# Patient Record
Sex: Female | Born: 1945
Health system: Southern US, Community
[De-identification: ages and names within clinical notes are randomized; demographics above are authoritative.]

## PROBLEM LIST (undated history)

## (undated) DIAGNOSIS — I729 Aneurysm of unspecified site: Secondary | ICD-10-CM

## (undated) DIAGNOSIS — I2510 Atherosclerotic heart disease of native coronary artery without angina pectoris: Secondary | ICD-10-CM

## (undated) DIAGNOSIS — M810 Age-related osteoporosis without current pathological fracture: Secondary | ICD-10-CM

## (undated) DIAGNOSIS — I2102 ST elevation (STEMI) myocardial infarction involving left anterior descending coronary artery: Secondary | ICD-10-CM

## (undated) DIAGNOSIS — K219 Gastro-esophageal reflux disease without esophagitis: Secondary | ICD-10-CM

## (undated) DIAGNOSIS — I10 Essential (primary) hypertension: Secondary | ICD-10-CM

## (undated) DIAGNOSIS — E119 Type 2 diabetes mellitus without complications: Secondary | ICD-10-CM

## (undated) DIAGNOSIS — I255 Ischemic cardiomyopathy: Secondary | ICD-10-CM

## (undated) DIAGNOSIS — E785 Hyperlipidemia, unspecified: Secondary | ICD-10-CM

## (undated) DIAGNOSIS — M1990 Unspecified osteoarthritis, unspecified site: Secondary | ICD-10-CM

## (undated) DIAGNOSIS — K5190 Ulcerative colitis, unspecified, without complications: Secondary | ICD-10-CM

## (undated) DIAGNOSIS — M5030 Other cervical disc degeneration, unspecified cervical region: Secondary | ICD-10-CM

## (undated) HISTORY — DX: Gastro-esophageal reflux disease without esophagitis: K21.9

## (undated) HISTORY — PX: CARDIAC CATHETERIZATION: SHX172

## (undated) HISTORY — PX: BLADDER SUSPENSION: SHX72

## (undated) HISTORY — DX: Hyperlipidemia, unspecified: E78.5

## (undated) HISTORY — PX: ABDOMINAL HYSTERECTOMY: SHX81

## (undated) HISTORY — DX: Age-related osteoporosis without current pathological fracture: M81.0

## (undated) HISTORY — PX: PTCA: SHX146

## (undated) HISTORY — PX: NECK SURGERY: SHX720

---

## 1997-09-09 ENCOUNTER — Ambulatory Visit (HOSPITAL_BASED_OUTPATIENT_CLINIC_OR_DEPARTMENT_OTHER): Admission: RE | Admit: 1997-09-09 | Discharge: 1997-09-09 | Payer: Self-pay | Admitting: Orthopedic Surgery

## 1999-07-16 ENCOUNTER — Encounter: Admission: RE | Admit: 1999-07-16 | Discharge: 1999-07-16 | Payer: Self-pay | Admitting: Neurosurgery

## 1999-07-16 ENCOUNTER — Encounter: Payer: Self-pay | Admitting: Neurosurgery

## 1999-07-25 ENCOUNTER — Ambulatory Visit (HOSPITAL_COMMUNITY): Admission: RE | Admit: 1999-07-25 | Discharge: 1999-07-25 | Payer: Self-pay | Admitting: Neurosurgery

## 1999-07-25 ENCOUNTER — Encounter: Payer: Self-pay | Admitting: Neurosurgery

## 1999-08-02 ENCOUNTER — Encounter: Payer: Self-pay | Admitting: Neurosurgery

## 1999-08-02 ENCOUNTER — Ambulatory Visit (HOSPITAL_COMMUNITY): Admission: RE | Admit: 1999-08-02 | Discharge: 1999-08-02 | Payer: Self-pay | Admitting: Neurosurgery

## 1999-08-20 ENCOUNTER — Encounter: Payer: Self-pay | Admitting: Internal Medicine

## 1999-08-20 ENCOUNTER — Encounter: Admission: RE | Admit: 1999-08-20 | Discharge: 1999-08-20 | Payer: Self-pay | Admitting: *Deleted

## 2000-01-02 ENCOUNTER — Ambulatory Visit (HOSPITAL_COMMUNITY): Admission: RE | Admit: 2000-01-02 | Discharge: 2000-01-02 | Payer: Self-pay | Admitting: Neurosurgery

## 2000-01-02 ENCOUNTER — Encounter: Payer: Self-pay | Admitting: Neurosurgery

## 2000-04-22 ENCOUNTER — Ambulatory Visit (HOSPITAL_COMMUNITY): Admission: RE | Admit: 2000-04-22 | Discharge: 2000-04-22 | Payer: Self-pay | Admitting: Neurosurgery

## 2000-04-22 ENCOUNTER — Encounter: Payer: Self-pay | Admitting: Neurosurgery

## 2000-08-21 ENCOUNTER — Encounter: Payer: Self-pay | Admitting: Unknown Physician Specialty

## 2000-08-21 ENCOUNTER — Encounter: Admission: RE | Admit: 2000-08-21 | Discharge: 2000-08-21 | Payer: Self-pay | Admitting: Unknown Physician Specialty

## 2001-03-23 ENCOUNTER — Encounter: Payer: Self-pay | Admitting: Neurosurgery

## 2001-03-23 ENCOUNTER — Ambulatory Visit (HOSPITAL_COMMUNITY): Admission: RE | Admit: 2001-03-23 | Discharge: 2001-03-23 | Payer: Self-pay | Admitting: Neurosurgery

## 2001-10-16 ENCOUNTER — Encounter: Payer: Self-pay | Admitting: Unknown Physician Specialty

## 2001-10-16 ENCOUNTER — Encounter: Admission: RE | Admit: 2001-10-16 | Discharge: 2001-10-16 | Payer: Self-pay | Admitting: Unknown Physician Specialty

## 2001-11-02 ENCOUNTER — Encounter: Payer: Self-pay | Admitting: Neurology

## 2001-11-02 ENCOUNTER — Ambulatory Visit (HOSPITAL_COMMUNITY): Admission: RE | Admit: 2001-11-02 | Discharge: 2001-11-02 | Payer: Self-pay | Admitting: Neurology

## 2002-05-08 ENCOUNTER — Encounter: Payer: Self-pay | Admitting: Neurology

## 2002-05-08 ENCOUNTER — Ambulatory Visit (HOSPITAL_COMMUNITY): Admission: RE | Admit: 2002-05-08 | Discharge: 2002-05-08 | Payer: Self-pay | Admitting: Neurology

## 2002-06-13 ENCOUNTER — Ambulatory Visit (HOSPITAL_COMMUNITY): Admission: RE | Admit: 2002-06-13 | Discharge: 2002-06-13 | Payer: Self-pay | Admitting: Interventional Radiology

## 2002-08-28 ENCOUNTER — Other Ambulatory Visit: Admission: RE | Admit: 2002-08-28 | Discharge: 2002-08-28 | Payer: Self-pay | Admitting: Gynecology

## 2002-10-30 ENCOUNTER — Encounter: Payer: Self-pay | Admitting: Neurology

## 2002-10-30 ENCOUNTER — Ambulatory Visit (HOSPITAL_COMMUNITY): Admission: RE | Admit: 2002-10-30 | Discharge: 2002-10-30 | Payer: Self-pay | Admitting: Neurology

## 2002-11-01 ENCOUNTER — Encounter: Payer: Self-pay | Admitting: Unknown Physician Specialty

## 2002-11-01 ENCOUNTER — Encounter: Admission: RE | Admit: 2002-11-01 | Discharge: 2002-11-01 | Payer: Self-pay | Admitting: Unknown Physician Specialty

## 2003-04-21 ENCOUNTER — Ambulatory Visit (HOSPITAL_COMMUNITY): Admission: RE | Admit: 2003-04-21 | Discharge: 2003-04-21 | Payer: Self-pay | Admitting: Neurology

## 2003-05-02 ENCOUNTER — Ambulatory Visit (HOSPITAL_COMMUNITY): Admission: RE | Admit: 2003-05-02 | Discharge: 2003-05-02 | Payer: Self-pay | Admitting: Neurology

## 2003-07-22 ENCOUNTER — Encounter (INDEPENDENT_AMBULATORY_CARE_PROVIDER_SITE_OTHER): Payer: Self-pay | Admitting: Gastroenterology

## 2004-06-08 ENCOUNTER — Ambulatory Visit (HOSPITAL_COMMUNITY): Admission: RE | Admit: 2004-06-08 | Discharge: 2004-06-08 | Payer: Self-pay | Admitting: Interventional Radiology

## 2004-06-15 ENCOUNTER — Ambulatory Visit (HOSPITAL_COMMUNITY): Admission: RE | Admit: 2004-06-15 | Discharge: 2004-06-15 | Payer: Self-pay | Admitting: *Deleted

## 2004-07-19 ENCOUNTER — Ambulatory Visit (HOSPITAL_COMMUNITY): Admission: RE | Admit: 2004-07-19 | Discharge: 2004-07-19 | Payer: Self-pay | Admitting: Orthopedic Surgery

## 2004-07-22 ENCOUNTER — Encounter: Admission: RE | Admit: 2004-07-22 | Discharge: 2004-07-22 | Payer: Self-pay | Admitting: Unknown Physician Specialty

## 2004-08-31 ENCOUNTER — Ambulatory Visit (HOSPITAL_COMMUNITY): Admission: RE | Admit: 2004-08-31 | Discharge: 2004-08-31 | Payer: Self-pay | Admitting: Unknown Physician Specialty

## 2004-09-08 ENCOUNTER — Ambulatory Visit: Payer: Self-pay | Admitting: Gastroenterology

## 2004-10-25 ENCOUNTER — Ambulatory Visit: Payer: Self-pay | Admitting: Gastroenterology

## 2005-11-22 ENCOUNTER — Ambulatory Visit (HOSPITAL_COMMUNITY): Admission: RE | Admit: 2005-11-22 | Discharge: 2005-11-22 | Payer: Self-pay | Admitting: Neurosurgery

## 2005-12-05 ENCOUNTER — Ambulatory Visit (HOSPITAL_COMMUNITY): Admission: RE | Admit: 2005-12-05 | Discharge: 2005-12-05 | Payer: Self-pay | Admitting: Interventional Radiology

## 2006-04-27 ENCOUNTER — Ambulatory Visit (HOSPITAL_COMMUNITY): Admission: RE | Admit: 2006-04-27 | Discharge: 2006-04-27 | Payer: Self-pay | Admitting: Unknown Physician Specialty

## 2006-05-02 ENCOUNTER — Encounter: Admission: RE | Admit: 2006-05-02 | Discharge: 2006-05-02 | Payer: Self-pay

## 2008-07-21 ENCOUNTER — Ambulatory Visit: Payer: Self-pay | Admitting: Gastroenterology

## 2008-07-21 DIAGNOSIS — K219 Gastro-esophageal reflux disease without esophagitis: Secondary | ICD-10-CM | POA: Insufficient documentation

## 2008-07-21 DIAGNOSIS — R109 Unspecified abdominal pain: Secondary | ICD-10-CM

## 2008-07-21 DIAGNOSIS — R197 Diarrhea, unspecified: Secondary | ICD-10-CM

## 2008-07-21 DIAGNOSIS — Z860100 Personal history of colon polyps, unspecified: Secondary | ICD-10-CM | POA: Insufficient documentation

## 2008-07-21 DIAGNOSIS — K5730 Diverticulosis of large intestine without perforation or abscess without bleeding: Secondary | ICD-10-CM | POA: Insufficient documentation

## 2008-07-21 DIAGNOSIS — R143 Flatulence: Secondary | ICD-10-CM

## 2008-07-21 DIAGNOSIS — R141 Gas pain: Secondary | ICD-10-CM | POA: Insufficient documentation

## 2008-07-21 DIAGNOSIS — R1084 Generalized abdominal pain: Secondary | ICD-10-CM | POA: Insufficient documentation

## 2008-07-21 DIAGNOSIS — R142 Eructation: Secondary | ICD-10-CM

## 2008-09-08 ENCOUNTER — Encounter: Payer: Self-pay | Admitting: Gastroenterology

## 2010-06-12 ENCOUNTER — Encounter: Payer: Self-pay | Admitting: Unknown Physician Specialty

## 2010-06-13 ENCOUNTER — Encounter: Payer: Self-pay | Admitting: Interventional Radiology

## 2010-06-13 ENCOUNTER — Encounter: Payer: Self-pay | Admitting: Neurosurgery

## 2010-09-07 ENCOUNTER — Other Ambulatory Visit: Payer: Self-pay | Admitting: Neurology

## 2010-09-07 DIAGNOSIS — I671 Cerebral aneurysm, nonruptured: Secondary | ICD-10-CM

## 2010-09-09 ENCOUNTER — Ambulatory Visit
Admission: RE | Admit: 2010-09-09 | Discharge: 2010-09-09 | Disposition: A | Discharge status: Home / Self Care | Payer: Medicare Other | Source: Ambulatory Visit | Attending: Neurology | Admitting: Neurology

## 2010-09-09 DIAGNOSIS — I671 Cerebral aneurysm, nonruptured: Secondary | ICD-10-CM

## 2010-09-09 MED ORDER — IOHEXOL 350 MG/ML SOLN
100.0000 mL | Freq: Once | INTRAVENOUS | Status: AC | PRN
  Administered 2010-09-09: 100 mL via INTRAVENOUS

## 2010-09-09 MED ORDER — IOHEXOL 300 MG/ML  SOLN: 50.0000 mL | Freq: Once | INTRAMUSCULAR | Status: AC | PRN

## 2010-10-08 NOTE — Consult Note (Signed)
NAMEHENDY, Casey Avila              ACCOUNT NO.:  [id number]   MEDICAL RECORD NO.:  [id number]          PATIENT TYPE:  OUT   LOCATION:  XRAY                         FACILITY:  [facility]   PHYSICIAN:  Sanjeev K. Deveshwar, M.D.DATE OF BIRTH:  Feb 13, 1946   DATE OF CONSULTATION:  12/20/2005  DATE OF DISCHARGE:                                   CONSULTATION   DATE OF CONSULT:  December 20, 2005.   CHIEF COMPLAINT:  Cerebral aneurysm.   HISTORY OF PRESENT ILLNESS:  This is a 65 year old female with a history of  headaches.  She has a known cerebral aneurysm.  She previously had a  cerebral angiogram performed on June 13, 2002 that showed a left  posterior communicating artery aneurysm.  She returned December 05, 2005 for a  repeat angiogram, at that time she was again noted to have a left posterior  communicating artery aneurysm that was measured at 5.2 mm x 3.2 mm.  The  aneurysm appeared to be a saccular aneurysm.  During her most recent  angiogram a newer technology consisting of a 3-D rotational arteriogram with  subsequent 3-D reconstruction was performed.  This revealed the aneurysm to  be irregular in the fundus area.  It is not certain whether the aneurysm was  changed or not because this newer technology was not available during her  previous angiogram.   The patient has since met with Dr. Newell Coral, her neurosurgeon to discuss  possible treatment options versus continued monitoring of the aneurysm.  She  presents today accompanied by her niece to discuss these same issues with  Dr. Corliss Skains.   PAST MEDICAL HISTORY:  Past medical history significant for hyperlipidemia  and hypertension.  She has borderline diabetes controlled through diet.  She  has had small strokes by MRI.  She has history of hiatal hernia, peptic  ulcer disease, ulcerative colitis.  She has had two herniated disks.  She  has a right shoulder rotator cuff injury which is in need of repair however,  her surgeon Dr.  Margaree Mackintosh does not want to perform the surgery until her  aneurysm has been addressed.   PAST SURGICAL HISTORY:  Surgical history significant for a cervical fusion  at C5-C6, C6-C7 and a partial hysterectomy due to an abnormal Pap smear at  age 67.  She reports previous difficulties with anesthesia consisting of  both hypertension and hypotension.   ALLERGIES:  THE PATIENT WAS PREVIOUSLY NOTED TO BE ALLERGIC TO KEFLEX AND  PENICILLIN, HOWEVER, SHE STATES THAT THIS IS NOT TRUE.  SHE IS ALLERGIC TO  AN ANTIFUNGAL MEDICATION WHICH CAUSED A RASH - SHE DOES NOT RECALL THE NAME.   CURRENT MEDICATIONS:  Include Vicodin, Percocet, Elavil, Ecotrin, calcium,  Centrum Silver, Nexium, Toprol XL, Xanax, Topamax, Effexor, diltiazem and  she takes Cipro on a p.r.n. basis for cystic type infections.   SOCIAL HISTORY:  The patient is married.  She has two children.  She lives  in [place].  She has smoked 1 to 1-1/2 pack cigarettes per day for at least 40  years and continues to do so.  She  does not use alcohol.  She currently is  not working.   FAMILY HISTORY:  Her mother died at age 78 from a cerebral aneurysm.  Her  father died at age 60 from cirrhosis.  Her grandmother died at age 69 from a  cerebral aneurysm.  She has a granddaughter who had surgery for a cerebral  aneurysm.   IMPRESSION AND PLAN:  As noted this patient presents today accompanied by  her niece to discuss possible treatment options for her cerebral aneurysm.  Dr. Corliss Skains reviewed the results of the most recent angiogram as well as  her previous angiogram with the patient and her niece.  He explained the new  three-dimensional arteriogram and the findings revealed by this most recent  study.  He went over the treatment options for the aneurysm including  continued monitoring, open surgery with clipping as well as endovascular  coiling.  Risks and benefits of each option were discussed in detail.  All  of the patient's questions were  answered.  The patient wants to go home and  think about her options in detail.  Dr. Corliss Skains agreed with this approach.  The patient will contact us once she has made a decision.  In the interim,  Dr. Corliss Skains has recommended that she try to quit smoking and be sure that  her blood pressure is under good control to further decrease the risk of  aneurysm rupture.  As noted this patient does have a very strong family  history of ruptured aneurysms.   Greater than 40 minutes was spent on this consult.      Delton See, P.A.    ______________________________  Grandville Silos. Corliss Skains, M.D.    DR/MEDQ  D:  12/20/2005  T:  12/20/2005  Job:  [id number]   cc:   Hewitt Shorts, M.D.  Genene Churn. Love, M.D.  Nena Jordan

## 2010-12-17 ENCOUNTER — Other Ambulatory Visit: Payer: Self-pay | Admitting: Neurosurgery

## 2010-12-17 DIAGNOSIS — M545 Low back pain, unspecified: Secondary | ICD-10-CM

## 2010-12-18 ENCOUNTER — Ambulatory Visit
Admission: RE | Admit: 2010-12-18 | Discharge: 2010-12-18 | Disposition: A | Discharge status: Home / Self Care | Payer: Medicare Other | Source: Ambulatory Visit | Attending: Neurosurgery | Admitting: Neurosurgery

## 2010-12-18 DIAGNOSIS — M545 Low back pain: Secondary | ICD-10-CM

## 2011-01-11 ENCOUNTER — Other Ambulatory Visit (HOSPITAL_COMMUNITY): Payer: Self-pay | Admitting: Neurosurgery

## 2011-01-11 ENCOUNTER — Ambulatory Visit (HOSPITAL_COMMUNITY)
Admission: RE | Admit: 2011-01-11 | Discharge: 2011-01-11 | Disposition: A | Discharge status: Home / Self Care | Payer: Medicare Other | Source: Ambulatory Visit | Attending: Neurosurgery | Admitting: Neurosurgery

## 2011-01-11 ENCOUNTER — Encounter (HOSPITAL_COMMUNITY)
Admission: RE | Admit: 2011-01-11 | Discharge: 2011-01-11 | Disposition: A | Discharge status: Home / Self Care | Payer: Medicare Other | Source: Ambulatory Visit | Attending: Neurosurgery | Admitting: Neurosurgery

## 2011-01-11 DIAGNOSIS — Z01812 Encounter for preprocedural laboratory examination: Secondary | ICD-10-CM | POA: Insufficient documentation

## 2011-01-11 DIAGNOSIS — J4489 Other specified chronic obstructive pulmonary disease: Secondary | ICD-10-CM | POA: Insufficient documentation

## 2011-01-11 DIAGNOSIS — Z01811 Encounter for preprocedural respiratory examination: Secondary | ICD-10-CM | POA: Insufficient documentation

## 2011-01-11 DIAGNOSIS — J449 Chronic obstructive pulmonary disease, unspecified: Secondary | ICD-10-CM | POA: Insufficient documentation

## 2011-01-11 DIAGNOSIS — M47812 Spondylosis without myelopathy or radiculopathy, cervical region: Secondary | ICD-10-CM

## 2011-01-11 LAB — BASIC METABOLIC PANEL
BUN: 8 mg/dL (ref 6–23)
CO2: 26 mEq/L (ref 19–32)
Calcium: 9.5 mg/dL (ref 8.4–10.5)
Chloride: 110 mEq/L (ref 96–112)
Creatinine, Ser: 0.8 mg/dL (ref 0.50–1.10)
GFR calc Af Amer: >60 mL/min (ref >60)
GFR calc non Af Amer: >60 mL/min (ref >60)
Glucose, Bld: 157 mg/dL — ABNORMAL HIGH (ref 70–99)
Potassium: 4.1 mEq/L (ref 3.5–5.1)
Sodium: 145 mEq/L (ref 135–145)

## 2011-01-11 LAB — SURGICAL PCR SCREEN
MRSA, PCR: NEGATIVE
Staphylococcus aureus: NEGATIVE

## 2011-01-11 LAB — CBC
HCT: 39.6 % (ref 36.0–46.0)
Hemoglobin: 14.2 g/dL (ref 12.0–15.0)
MCH: 31.8 pg (ref 26.0–34.0)
MCHC: 35.9 g/dL (ref 30.0–36.0)
MCV: 88.8 fL (ref 78.0–100.0)
Platelets: 273 10*3/uL (ref 150–400)
RBC: 4.46 MIL/uL (ref 3.87–5.11)
RDW: 12.8 % (ref 11.5–15.5)
WBC: 10.5 10*3/uL (ref 4.0–10.5)

## 2011-01-19 ENCOUNTER — Observation Stay (HOSPITAL_COMMUNITY): Payer: Medicare Other

## 2011-01-19 ENCOUNTER — Observation Stay (HOSPITAL_COMMUNITY)
Admission: RE | Admit: 2011-01-19 | Discharge: 2011-01-20 | Disposition: A | Discharge status: Home / Self Care | Payer: Medicare Other | Source: Ambulatory Visit | Attending: Neurosurgery | Admitting: Neurosurgery

## 2011-01-19 DIAGNOSIS — Z01818 Encounter for other preprocedural examination: Secondary | ICD-10-CM | POA: Insufficient documentation

## 2011-01-19 DIAGNOSIS — M5030 Other cervical disc degeneration, unspecified cervical region: Secondary | ICD-10-CM | POA: Insufficient documentation

## 2011-01-19 DIAGNOSIS — M47812 Spondylosis without myelopathy or radiculopathy, cervical region: Secondary | ICD-10-CM | POA: Insufficient documentation

## 2011-01-19 DIAGNOSIS — Z01812 Encounter for preprocedural laboratory examination: Secondary | ICD-10-CM | POA: Insufficient documentation

## 2011-01-19 DIAGNOSIS — M5020 Other cervical disc displacement, unspecified cervical region: Principal | ICD-10-CM | POA: Insufficient documentation

## 2011-01-19 DIAGNOSIS — J4489 Other specified chronic obstructive pulmonary disease: Secondary | ICD-10-CM | POA: Insufficient documentation

## 2011-01-19 DIAGNOSIS — J449 Chronic obstructive pulmonary disease, unspecified: Secondary | ICD-10-CM | POA: Insufficient documentation

## 2011-01-19 DIAGNOSIS — K219 Gastro-esophageal reflux disease without esophagitis: Secondary | ICD-10-CM | POA: Insufficient documentation

## 2011-01-19 DIAGNOSIS — I10 Essential (primary) hypertension: Secondary | ICD-10-CM | POA: Insufficient documentation

## 2011-01-19 DIAGNOSIS — G8929 Other chronic pain: Secondary | ICD-10-CM | POA: Insufficient documentation

## 2011-01-19 LAB — GLUCOSE, CAPILLARY
Glucose-Capillary: 114 mg/dL — ABNORMAL HIGH (ref 70–99)
Glucose-Capillary: 209 mg/dL — ABNORMAL HIGH (ref 70–99)

## 2011-01-20 LAB — GLUCOSE, CAPILLARY: Glucose-Capillary: 131 mg/dL — ABNORMAL HIGH (ref 70–99)

## 2011-02-04 NOTE — Op Note (Signed)
NAMEMISSOURI, LAPAGLIA              ACCOUNT NO.:  [id number]  MEDICAL RECORD NO.:  [id number]  LOCATION:  3535                         FACILITY:  [facility]  PHYSICIAN:  Hewitt Shorts, M.D.DATE OF BIRTH:  1945-11-14  DATE OF PROCEDURE:  01/19/2011 DATE OF DISCHARGE:                              OPERATIVE REPORT   PREOPERATIVE DIAGNOSIS:  C4-5 cervical disk herniation, cervical degenerative disk disease, cervical spondylosis and cervical radiculopathy.  POSTOPERATIVE DIAGNOSIS:  C4-5 cervical disk herniation, cervical degenerative disk disease, cervical spondylosis and cervical radiculopathy.  PROCEDURE:  C4-5 anterior cervical decompression arthrodesis with allograft and Tether cervical plating.  SURGEON:  Hewitt Shorts, MD  ASSISTANT:  Dr. Lovell Sheehan.  ANESTHESIA:  General endotracheal.  INDICATIONS:  The patient is a 65 year old woman status post previous C5- 6, C6-7 anterior cervical diskectomy and fusion over 10 years ago.  She presented now with neck and left cervical radicular pain.  She was found to have C4-5 disk herniation.  The decision made to proceed with diskectomy and arthrodesis.  PROCEDURE:  The patient was brought to the operating room and placed under general endotracheal anesthesia.  The patient was placed in 10 pounds of Holter traction.  The neck was prepped with Betadine soap and solution and draped in a sterile fashion.  Horizontal incision was made on the left side of the neck.  The line of incision was infiltrated with local epinephrine.  Incision made and carried down through subcutaneous tissue and platysma.  Bipolar cautery and electrocautery were used to maintain hemostasis.  Dissection was then carried out through an avascular into the sternocleidomastoid, carotid artery and jugular vein laterally and trachea and esophagus medially.  The ventral aspect of the vertebral column was identified.  The previous plate overlying the C5 vertebra  was identified and we identified the C4-5 disk space immediately rostral to the upper edge of the plate.  The annulus was incised and disk space entered.  Diskectomy was performed using variety of micro curettes and pituitary rongeurs.  The operating microscope was draped and brought into the field for additional navigation, illumination, and visualization.  The remainder of the decompression was performed using microdissection and microsurgical technique.  The cartilaginous endplates of the vertebral bodies were removed using micro curettes along with the high-speed drill.  Then, posterior osteophytic overgrowth was removed using the high-speed drill along with the 2-mm Kerrison punch with a thin footplate.  Posterior longitudinal ligament was removed along with the disk herniation.  We were able to decompress the spinal canal and the thecal sac.  We then turned our attention to the neural foramina and removed spondylotic overgrowth and disk material bilaterally decompressing the exiting L5 nerve roots and the neural foramen.  Once decompression was completed, hemostasis was established with use of Gelfoam soaked in thrombin.  We measured the height of the intervertebral disk space and selected a 7-mm allograft implant, it was hydrated in saline solution and positioned in the intervertebral disk space and countersunk.  We then attempted to position a Tether cervical plate over the fusion construct, but the upper edge of the old Align plate was too close to the superior C5 endplate, and therefore we had to  use a carbide medical cutting bur to cut the upper end of the old plate to remove the 2 screws in C5.  The screw holes were filled with Gelfoam and then we positioned a 12-mm Tether cervical plate over the fusion construct, it was secured with a pair of 13 x 4 mm screws placing a pair of variable screws at C4 and a pair of fixed screws at C5.  Once all four screws were positioned, final  tightening was done.  The wound was irrigated extensively with saline solution to try and wash away as much of the metal burs as possible and then we confirmed hemostasis, then we proceeded with closure.  Platysma was closed with interrupted and inverted 2-0 undyed Vicryl sutures.  Subcutaneous and subcuticulars were closed with interrupted and inverted 3-0 undyed Vicryl sutures.  Skin was closed with Dermabond.  An x-ray was taken which showed the graft, plate, and screws in good position.  The alignment was good and then the patient was taken out of traction to be reversed from the anesthetic, extubated, and transferred to the recovery room for further care. Estimated blood loss was 25 mL.  Sponge and needle count correct.     Hewitt Shorts, M.D.     RWN/MEDQ  D:  01/19/2011  T:  01/19/2011  Job:  [id number]  Electronically Signed by Shirlean Kelly M.D. on 02/04/2011 05:13:51 PM

## 2011-02-08 ENCOUNTER — Ambulatory Visit
Admission: RE | Admit: 2011-02-08 | Discharge: 2011-02-08 | Disposition: A | Discharge status: Home / Self Care | Payer: Medicare Other | Source: Ambulatory Visit | Attending: Neurosurgery | Admitting: Neurosurgery

## 2011-02-08 ENCOUNTER — Other Ambulatory Visit: Payer: Self-pay | Admitting: Neurosurgery

## 2011-02-08 DIAGNOSIS — M25512 Pain in left shoulder: Secondary | ICD-10-CM

## 2011-08-23 ENCOUNTER — Other Ambulatory Visit: Payer: Self-pay | Admitting: Neurology

## 2011-08-23 DIAGNOSIS — I671 Cerebral aneurysm, nonruptured: Secondary | ICD-10-CM

## 2011-08-30 ENCOUNTER — Ambulatory Visit
Admission: RE | Admit: 2011-08-30 | Discharge: 2011-08-30 | Disposition: A | Discharge status: Home / Self Care | Payer: Medicare Other | Source: Ambulatory Visit | Attending: Neurology | Admitting: Neurology

## 2011-08-30 ENCOUNTER — Other Ambulatory Visit: Payer: Self-pay | Admitting: Internal Medicine

## 2011-08-30 ENCOUNTER — Ambulatory Visit
Admission: RE | Admit: 2011-08-30 | Discharge: 2011-08-30 | Disposition: A | Discharge status: Home / Self Care | Payer: Medicare Other | Source: Ambulatory Visit | Attending: Internal Medicine | Admitting: Internal Medicine

## 2011-08-30 DIAGNOSIS — I671 Cerebral aneurysm, nonruptured: Secondary | ICD-10-CM

## 2011-08-30 DIAGNOSIS — Z1231 Encounter for screening mammogram for malignant neoplasm of breast: Secondary | ICD-10-CM

## 2011-08-30 MED ORDER — IOHEXOL 350 MG/ML SOLN
100.0000 mL | Freq: Once | INTRAVENOUS | Status: AC | PRN
  Administered 2011-08-30: 100 mL via INTRAVENOUS

## 2011-10-07 ENCOUNTER — Other Ambulatory Visit: Payer: Self-pay | Admitting: Neurosurgery

## 2011-10-07 DIAGNOSIS — M5030 Other cervical disc degeneration, unspecified cervical region: Secondary | ICD-10-CM

## 2011-10-07 DIAGNOSIS — M5412 Radiculopathy, cervical region: Secondary | ICD-10-CM

## 2011-10-07 DIAGNOSIS — M5020 Other cervical disc displacement, unspecified cervical region: Secondary | ICD-10-CM

## 2011-10-19 ENCOUNTER — Ambulatory Visit
Admission: RE | Admit: 2011-10-19 | Discharge: 2011-10-19 | Disposition: A | Discharge status: Home / Self Care | Payer: Medicare Other | Source: Ambulatory Visit | Attending: Neurosurgery | Admitting: Neurosurgery

## 2011-10-19 DIAGNOSIS — M5020 Other cervical disc displacement, unspecified cervical region: Secondary | ICD-10-CM

## 2011-10-19 DIAGNOSIS — M5030 Other cervical disc degeneration, unspecified cervical region: Secondary | ICD-10-CM

## 2011-10-19 DIAGNOSIS — M5412 Radiculopathy, cervical region: Secondary | ICD-10-CM

## 2012-02-13 ENCOUNTER — Other Ambulatory Visit: Payer: Self-pay | Admitting: Neurosurgery

## 2012-02-13 DIAGNOSIS — M542 Cervicalgia: Secondary | ICD-10-CM

## 2012-02-13 DIAGNOSIS — M5030 Other cervical disc degeneration, unspecified cervical region: Secondary | ICD-10-CM

## 2012-02-13 DIAGNOSIS — M5020 Other cervical disc displacement, unspecified cervical region: Secondary | ICD-10-CM

## 2012-03-05 ENCOUNTER — Ambulatory Visit
Admission: RE | Admit: 2012-03-05 | Discharge: 2012-03-05 | Disposition: A | Discharge status: Home / Self Care | Payer: Medicare Other | Source: Ambulatory Visit | Attending: Neurosurgery | Admitting: Neurosurgery

## 2012-03-05 DIAGNOSIS — M5020 Other cervical disc displacement, unspecified cervical region: Secondary | ICD-10-CM

## 2012-03-05 DIAGNOSIS — M542 Cervicalgia: Secondary | ICD-10-CM

## 2012-03-05 DIAGNOSIS — M5030 Other cervical disc degeneration, unspecified cervical region: Secondary | ICD-10-CM

## 2012-06-12 ENCOUNTER — Other Ambulatory Visit: Payer: Self-pay | Admitting: Neurology

## 2012-06-12 DIAGNOSIS — I671 Cerebral aneurysm, nonruptured: Secondary | ICD-10-CM

## 2012-06-15 ENCOUNTER — Other Ambulatory Visit: Payer: Medicare Other

## 2013-01-22 ENCOUNTER — Other Ambulatory Visit (HOSPITAL_COMMUNITY): Payer: Self-pay | Admitting: Neurosurgery

## 2013-01-22 DIAGNOSIS — I671 Cerebral aneurysm, nonruptured: Secondary | ICD-10-CM

## 2013-01-24 ENCOUNTER — Ambulatory Visit (HOSPITAL_COMMUNITY)
Admission: RE | Admit: 2013-01-24 | Discharge: 2013-01-24 | Disposition: A | Discharge status: Home / Self Care | Payer: Medicare Other | Source: Ambulatory Visit | Attending: Neurosurgery | Admitting: Neurosurgery

## 2013-01-24 DIAGNOSIS — I671 Cerebral aneurysm, nonruptured: Secondary | ICD-10-CM

## 2013-01-24 DIAGNOSIS — Z09 Encounter for follow-up examination after completed treatment for conditions other than malignant neoplasm: Secondary | ICD-10-CM | POA: Insufficient documentation

## 2013-01-24 LAB — BUN: BUN: 10 mg/dL (ref 6–23)

## 2013-01-24 LAB — CREATININE, SERUM
Creatinine, Ser: 0.66 mg/dL (ref 0.50–1.10)
GFR calc Af Amer: >90 mL/min (ref >90)
GFR calc non Af Amer: 89 mL/min — ABNORMAL LOW (ref >90)

## 2013-01-24 MED ORDER — IOHEXOL 350 MG/ML SOLN: 50.0000 mL | Freq: Once | INTRAVENOUS | Status: AC | PRN

## 2013-07-22 ENCOUNTER — Other Ambulatory Visit: Payer: Self-pay

## 2013-07-22 DIAGNOSIS — Z1231 Encounter for screening mammogram for malignant neoplasm of breast: Secondary | ICD-10-CM

## 2013-07-23 ENCOUNTER — Ambulatory Visit
Admission: RE | Admit: 2013-07-23 | Discharge: 2013-07-23 | Disposition: A | Discharge status: Home / Self Care | Payer: Medicare Other | Source: Ambulatory Visit

## 2013-07-23 DIAGNOSIS — Z1231 Encounter for screening mammogram for malignant neoplasm of breast: Secondary | ICD-10-CM

## 2014-02-26 ENCOUNTER — Other Ambulatory Visit (HOSPITAL_COMMUNITY): Payer: Self-pay | Admitting: Internal Medicine

## 2014-02-26 ENCOUNTER — Ambulatory Visit (HOSPITAL_COMMUNITY)
Admission: RE | Admit: 2014-02-26 | Discharge: 2014-02-26 | Disposition: A | Discharge status: Home / Self Care | Payer: Medicare Other | Source: Ambulatory Visit | Attending: Internal Medicine | Admitting: Internal Medicine

## 2014-02-26 DIAGNOSIS — R1031 Right lower quadrant pain: Secondary | ICD-10-CM | POA: Diagnosis present

## 2014-02-26 DIAGNOSIS — R319 Hematuria, unspecified: Secondary | ICD-10-CM | POA: Insufficient documentation

## 2014-02-26 DIAGNOSIS — R109 Unspecified abdominal pain: Secondary | ICD-10-CM

## 2014-02-26 DIAGNOSIS — M545 Low back pain, unspecified: Secondary | ICD-10-CM

## 2014-02-26 DIAGNOSIS — N200 Calculus of kidney: Secondary | ICD-10-CM | POA: Insufficient documentation

## 2014-04-09 ENCOUNTER — Other Ambulatory Visit: Payer: Self-pay | Admitting: Neurosurgery

## 2014-04-09 DIAGNOSIS — M542 Cervicalgia: Secondary | ICD-10-CM

## 2014-04-11 ENCOUNTER — Ambulatory Visit
Admission: RE | Admit: 2014-04-11 | Discharge: 2014-04-11 | Disposition: A | Discharge status: Home / Self Care | Payer: Medicare Other | Source: Ambulatory Visit | Attending: Neurosurgery | Admitting: Neurosurgery

## 2014-04-11 DIAGNOSIS — M542 Cervicalgia: Secondary | ICD-10-CM

## 2014-04-21 ENCOUNTER — Other Ambulatory Visit: Payer: Self-pay | Admitting: Neurosurgery

## 2014-04-21 DIAGNOSIS — I671 Cerebral aneurysm, nonruptured: Secondary | ICD-10-CM

## 2014-04-25 ENCOUNTER — Ambulatory Visit
Admission: RE | Admit: 2014-04-25 | Discharge: 2014-04-25 | Disposition: A | Discharge status: Home / Self Care | Payer: Medicare Other | Source: Ambulatory Visit | Attending: Neurosurgery | Admitting: Neurosurgery

## 2014-04-25 DIAGNOSIS — I671 Cerebral aneurysm, nonruptured: Secondary | ICD-10-CM

## 2014-04-25 MED ORDER — IOHEXOL 350 MG/ML SOLN
80.0000 mL | Freq: Once | INTRAVENOUS | Status: AC | PRN
Start: 2014-04-25 — End: 2014-04-25
  Administered 2014-04-25: 80 mL via INTRAVENOUS

## 2014-05-02 ENCOUNTER — Inpatient Hospital Stay (HOSPITAL_COMMUNITY): Payer: Medicare Other

## 2014-05-02 ENCOUNTER — Encounter (HOSPITAL_COMMUNITY): Payer: Self-pay | Admitting: Nurse Practitioner

## 2014-05-02 ENCOUNTER — Inpatient Hospital Stay (HOSPITAL_COMMUNITY)
Admission: EM | Admit: 2014-05-02 | Discharge: 2014-05-05 | DRG: 282 | Disposition: A | Discharge status: Home / Self Care | Payer: Medicare Other | Attending: Interventional Cardiology | Admitting: Interventional Cardiology

## 2014-05-02 ENCOUNTER — Encounter (HOSPITAL_COMMUNITY)
Admission: EM | Disposition: A | Discharge status: Home / Self Care | Payer: Medicare Other | Source: Home / Self Care | Attending: Interventional Cardiology

## 2014-05-02 ENCOUNTER — Emergency Department (HOSPITAL_COMMUNITY): Payer: Medicare Other

## 2014-05-02 DIAGNOSIS — I10 Essential (primary) hypertension: Secondary | ICD-10-CM | POA: Diagnosis present

## 2014-05-02 DIAGNOSIS — F17200 Nicotine dependence, unspecified, uncomplicated: Secondary | ICD-10-CM | POA: Diagnosis present

## 2014-05-02 DIAGNOSIS — Z885 Allergy status to narcotic agent status: Secondary | ICD-10-CM

## 2014-05-02 DIAGNOSIS — F17210 Nicotine dependence, cigarettes, uncomplicated: Secondary | ICD-10-CM | POA: Diagnosis present

## 2014-05-02 DIAGNOSIS — E785 Hyperlipidemia, unspecified: Secondary | ICD-10-CM | POA: Diagnosis present

## 2014-05-02 DIAGNOSIS — I2510 Atherosclerotic heart disease of native coronary artery without angina pectoris: Secondary | ICD-10-CM | POA: Diagnosis present

## 2014-05-02 DIAGNOSIS — I5041 Acute combined systolic (congestive) and diastolic (congestive) heart failure: Secondary | ICD-10-CM | POA: Diagnosis present

## 2014-05-02 DIAGNOSIS — I671 Cerebral aneurysm, nonruptured: Secondary | ICD-10-CM | POA: Diagnosis present

## 2014-05-02 DIAGNOSIS — Z955 Presence of coronary angioplasty implant and graft: Secondary | ICD-10-CM | POA: Diagnosis not present

## 2014-05-02 DIAGNOSIS — J449 Chronic obstructive pulmonary disease, unspecified: Secondary | ICD-10-CM | POA: Diagnosis present

## 2014-05-02 DIAGNOSIS — I213 ST elevation (STEMI) myocardial infarction of unspecified site: Secondary | ICD-10-CM

## 2014-05-02 DIAGNOSIS — I2102 ST elevation (STEMI) myocardial infarction involving left anterior descending coronary artery: Principal | ICD-10-CM

## 2014-05-02 DIAGNOSIS — R079 Chest pain, unspecified: Secondary | ICD-10-CM

## 2014-05-02 DIAGNOSIS — I255 Ischemic cardiomyopathy: Secondary | ICD-10-CM

## 2014-05-02 DIAGNOSIS — I517 Cardiomegaly: Secondary | ICD-10-CM

## 2014-05-02 DIAGNOSIS — R000 Tachycardia, unspecified: Secondary | ICD-10-CM | POA: Diagnosis present

## 2014-05-02 DIAGNOSIS — E1159 Type 2 diabetes mellitus with other circulatory complications: Secondary | ICD-10-CM

## 2014-05-02 DIAGNOSIS — I252 Old myocardial infarction: Secondary | ICD-10-CM | POA: Diagnosis not present

## 2014-05-02 HISTORY — DX: ST elevation (STEMI) myocardial infarction involving left anterior descending coronary artery: I21.02

## 2014-05-02 HISTORY — PX: LEFT HEART CATHETERIZATION WITH CORONARY ANGIOGRAM: SHX5451

## 2014-05-02 HISTORY — DX: Ischemic cardiomyopathy: I25.5

## 2014-05-02 HISTORY — DX: Aneurysm of unspecified site: I72.9

## 2014-05-02 HISTORY — DX: Essential (primary) hypertension: I10

## 2014-05-02 LAB — CBC
HCT: 40.3 % (ref 36.0–46.0)
HCT: 39.5 % (ref 36.0–46.0)
HEMOGLOBIN: 14 g/dL (ref 12.0–15.0)
Hemoglobin: 14.1 g/dL (ref 12.0–15.0)
MCH: 31 pg (ref 26.0–34.0)
MCH: 31.4 pg (ref 26.0–34.0)
MCHC: 34.7 g/dL (ref 30.0–36.0)
MCHC: 35.7 g/dL (ref 30.0–36.0)
MCV: 89.2 fL (ref 78.0–100.0)
MCV: 88 fL (ref 78.0–100.0)
PLATELETS: 226 10*3/uL (ref 150–400)
Platelets: 263 10*3/uL (ref 150–400)
RBC: 4.52 MIL/uL (ref 3.87–5.11)
RBC: 4.49 MIL/uL (ref 3.87–5.11)
RDW: 12.8 % (ref 11.5–15.5)
RDW: 12.8 % (ref 11.5–15.5)
WBC: 15.1 10*3/uL — ABNORMAL HIGH (ref 4.0–10.5)
WBC: 14.5 10*3/uL — AB (ref 4.0–10.5)

## 2014-05-02 LAB — BASIC METABOLIC PANEL
Anion gap: 17 — ABNORMAL HIGH (ref 5–15)
BUN: 13 mg/dL (ref 6–23)
CO2: 21 meq/L (ref 19–32)
Calcium: 9.6 mg/dL (ref 8.4–10.5)
Chloride: 104 mEq/L (ref 96–112)
Creatinine, Ser: 0.63 mg/dL (ref 0.50–1.10)
GFR calc Af Amer: >90 mL/min (ref >90)
Glucose, Bld: 175 mg/dL — ABNORMAL HIGH (ref 70–99)
Potassium: 3.7 mEq/L (ref 3.7–5.3)
Sodium: 142 mEq/L (ref 137–147)

## 2014-05-02 LAB — POCT I-STAT, CHEM 8
BUN: 11 mg/dL (ref 6–23)
Calcium, Ion: 1.22 mmol/L (ref 1.13–1.30)
Chloride: 104 mEq/L (ref 96–112)
Creatinine, Ser: 0.6 mg/dL (ref 0.50–1.10)
Glucose, Bld: 178 mg/dL — ABNORMAL HIGH (ref 70–99)
HCT: 41 % (ref 36.0–46.0)
Hemoglobin: 13.9 g/dL (ref 12.0–15.0)
Potassium: 3.2 mEq/L — ABNORMAL LOW (ref 3.7–5.3)
SODIUM: 140 meq/L (ref 137–147)
TCO2: 19 mmol/L (ref 0–100)

## 2014-05-02 LAB — PROTIME-INR
INR: 1.31 (ref 0.00–1.49)
Prothrombin Time: 16.4 seconds — ABNORMAL HIGH (ref 11.6–15.2)

## 2014-05-02 LAB — CK TOTAL AND CKMB (NOT AT ARMC)
CK TOTAL: 1573 U/L — AB (ref 7–177)
CK, MB: 106.6 ng/mL (ref 0.3–4.0)
CK, MB: 219.8 ng/mL (ref 0.3–4.0)
Relative Index: 11 — ABNORMAL HIGH (ref 0.0–2.5)
Relative Index: 14 — ABNORMAL HIGH (ref 0.0–2.5)
Total CK: 973 U/L — ABNORMAL HIGH (ref 7–177)

## 2014-05-02 LAB — I-STAT TROPONIN, ED: Troponin i, poc: 0.05 ng/mL (ref 0.00–0.08)

## 2014-05-02 LAB — MRSA PCR SCREENING: MRSA BY PCR: NEGATIVE

## 2014-05-02 LAB — CREATININE, SERUM
Creatinine, Ser: 0.57 mg/dL (ref 0.50–1.10)
GFR calc non Af Amer: >90 mL/min (ref >90)

## 2014-05-02 LAB — POCT ACTIVATED CLOTTING TIME: ACTIVATED CLOTTING TIME: 491 s

## 2014-05-02 SURGERY — LEFT HEART CATHETERIZATION WITH CORONARY ANGIOGRAM
Anesthesia: LOCAL

## 2014-05-02 MED ORDER — HEPARIN SODIUM (PORCINE) 5000 UNIT/ML IJ SOLN
5000.0000 [IU] | Freq: Three times a day (TID) | INTRAMUSCULAR | Status: DC
  Administered 2014-05-03 – 2014-05-05 (×7): 5000 [IU] via SUBCUTANEOUS
  Filled 2014-05-02 (×10): qty 1

## 2014-05-02 MED ORDER — HEPARIN (PORCINE) IN NACL 2-0.9 UNIT/ML-% IJ SOLN
INTRAMUSCULAR | Status: AC
  Filled 2014-05-02: qty 1500

## 2014-05-02 MED ORDER — SODIUM CHLORIDE 0.9 % IV SOLN
INTRAVENOUS | Status: AC
  Administered 2014-05-02: 14:00:00 via INTRAVENOUS

## 2014-05-02 MED ORDER — BIVALIRUDIN 250 MG IV SOLR
0.2500 mg/kg/h | INTRAVENOUS | Status: AC
  Administered 2014-05-02: 0.25 mg/kg/h via INTRAVENOUS
  Filled 2014-05-02: qty 250

## 2014-05-02 MED ORDER — ACETAMINOPHEN 325 MG PO TABS
650.0000 mg | ORAL_TABLET | ORAL | Status: DC | PRN
  Administered 2014-05-03 – 2014-05-05 (×4): 650 mg via ORAL
  Filled 2014-05-02 (×4): qty 2

## 2014-05-02 MED ORDER — TICAGRELOR 90 MG PO TABS
ORAL_TABLET | ORAL | Status: AC
  Filled 2014-05-02: qty 2

## 2014-05-02 MED ORDER — DILTIAZEM HCL ER COATED BEADS 180 MG PO CP24
180.0000 mg | ORAL_CAPSULE | Freq: Every day | ORAL | Status: DC
  Administered 2014-05-02 – 2014-05-03 (×2): 180 mg via ORAL
  Filled 2014-05-02 (×2): qty 1

## 2014-05-02 MED ORDER — CETYLPYRIDINIUM CHLORIDE 0.05 % MT LIQD
7.0000 mL | Freq: Two times a day (BID) | OROMUCOSAL | Status: DC
  Administered 2014-05-03 – 2014-05-05 (×4): 7 mL via OROMUCOSAL

## 2014-05-02 MED ORDER — ATORVASTATIN CALCIUM 80 MG PO TABS
80.0000 mg | ORAL_TABLET | Freq: Every day | ORAL | Status: DC
Start: 2014-05-02 — End: 2014-05-05
  Administered 2014-05-02 – 2014-05-04 (×3): 80 mg via ORAL
  Filled 2014-05-02 (×5): qty 1

## 2014-05-02 MED ORDER — NITROGLYCERIN 1 MG/10 ML FOR IR/CATH LAB
INTRA_ARTERIAL | Status: AC
  Filled 2014-05-02: qty 10

## 2014-05-02 MED ORDER — TICAGRELOR 90 MG PO TABS
90.0000 mg | ORAL_TABLET | Freq: Two times a day (BID) | ORAL | Status: DC
  Administered 2014-05-02 – 2014-05-05 (×6): 90 mg via ORAL
  Filled 2014-05-02 (×7): qty 1

## 2014-05-02 MED ORDER — MIDAZOLAM HCL 2 MG/2ML IJ SOLN
INTRAMUSCULAR | Status: AC
  Filled 2014-05-02: qty 2

## 2014-05-02 MED ORDER — METOPROLOL TARTRATE 12.5 MG HALF TABLET
12.5000 mg | ORAL_TABLET | Freq: Two times a day (BID) | ORAL | Status: DC
  Administered 2014-05-02 – 2014-05-03 (×3): 12.5 mg via ORAL
  Filled 2014-05-02 (×5): qty 1

## 2014-05-02 MED ORDER — HEPARIN SODIUM (PORCINE) 1000 UNIT/ML IJ SOLN
INTRAMUSCULAR | Status: AC
  Filled 2014-05-02: qty 1

## 2014-05-02 MED ORDER — LIDOCAINE HCL (PF) 1 % IJ SOLN
INTRAMUSCULAR | Status: AC
  Filled 2014-05-02: qty 30

## 2014-05-02 MED ORDER — ONDANSETRON HCL 4 MG/2ML IJ SOLN
4.0000 mg | Freq: Four times a day (QID) | INTRAMUSCULAR | Status: DC | PRN
  Administered 2014-05-02 – 2014-05-03 (×2): 4 mg via INTRAVENOUS
  Filled 2014-05-02 (×2): qty 2

## 2014-05-02 MED ORDER — FENTANYL CITRATE 0.05 MG/ML IJ SOLN
INTRAMUSCULAR | Status: AC
  Filled 2014-05-02: qty 2

## 2014-05-02 MED ORDER — ASPIRIN 81 MG PO CHEW
81.0000 mg | CHEWABLE_TABLET | Freq: Every day | ORAL | Status: DC
  Administered 2014-05-03 – 2014-05-05 (×3): 81 mg via ORAL
  Filled 2014-05-02 (×3): qty 1

## 2014-05-02 MED ORDER — BIVALIRUDIN 250 MG IV SOLR
INTRAVENOUS | Status: AC
  Filled 2014-05-02: qty 250

## 2014-05-02 MED ORDER — VERAPAMIL HCL 2.5 MG/ML IV SOLN
INTRAVENOUS | Status: AC
  Filled 2014-05-02: qty 2

## 2014-05-02 MED ORDER — ADENOSINE 6 MG/2ML IV SOLN
INTRAVENOUS | Status: AC
  Filled 2014-05-02: qty 2

## 2014-05-02 MED ORDER — TOPIRAMATE 100 MG PO TABS
100.0000 mg | ORAL_TABLET | Freq: Every day | ORAL | Status: DC
  Administered 2014-05-02 – 2014-05-05 (×4): 100 mg via ORAL
  Filled 2014-05-02 (×4): qty 1

## 2014-05-02 MED ORDER — HYDROMORPHONE HCL 1 MG/ML IJ SOLN
1.0000 mg | INTRAMUSCULAR | Status: DC | PRN
  Administered 2014-05-02 – 2014-05-03 (×2): 2 mg via INTRAVENOUS
  Filled 2014-05-02 (×2): qty 2

## 2014-05-02 NOTE — ED Provider Notes (Signed)
CSN: [id number]     Arrival date & time 05/02/14  1145 History   First MD Initiated Contact with Patient 05/02/14 1159     Chief Complaint  Patient presents with  . Chest Pain     (Consider location/radiation/quality/duration/timing/severity/associated sxs/prior Treatment) HPI Comments: Pt was seen at OSH today for CP - they left AMA and came here for further evaluation.  She has pain in the mid chest - heaviness in both arms, assocaited SOB - recently had increased DOE.  Sx are persistent, worsening, and not associated with n/v/d.  No known hx of CAD.  Has 2 X aneurysms in the head.  Patient is a 68 y.o. female presenting with chest pain. The history is provided by the patient.  Chest Pain   Past Medical History  Diagnosis Date  . Aneurysm     brain   Past Surgical History  Procedure Laterality Date  . Neck surgery     History reviewed. No pertinent family history. History  Substance Use Topics  . Smoking status: Current Every Day Smoker    Types: Cigarettes  . Smokeless tobacco: Not on file  . Alcohol Use: No   OB History    No data available     Review of Systems  Cardiovascular: Positive for chest pain.  All other systems reviewed and are negative.     Allergies  Review of patient's allergies indicates no known allergies.  Home Medications   Prior to Admission medications   Not on File   BP 133/90 mmHg  Pulse 94  Temp(Src) 97.4 F (36.3 C) (Oral)  Resp 16  SpO2 96% Physical Exam  Constitutional: She appears well-developed and well-nourished. She appears distressed.  HENT:  Head: Normocephalic and atraumatic.  Mouth/Throat: Oropharynx is clear and moist. No oropharyngeal exudate.  Eyes: Conjunctivae and EOM are normal. Pupils are equal, round, and reactive to light. Right eye exhibits no discharge. Left eye exhibits no discharge. No scleral icterus.  Neck: Normal range of motion. Neck supple. No JVD present. No thyromegaly present.  Cardiovascular:  Normal rate, regular rhythm, normal heart sounds and intact distal pulses.  Exam reveals no gallop and no friction rub.   No murmur heard. Pulmonary/Chest: Effort normal and breath sounds normal. No respiratory distress. She has no wheezes. She has no rales.  Abdominal: Soft. Bowel sounds are normal. She exhibits no distension and no mass. There is no tenderness.  Musculoskeletal: Normal range of motion. She exhibits no edema or tenderness.  Lymphadenopathy:    She has no cervical adenopathy.  Neurological: She is alert. Coordination normal.  Skin: Skin is warm and dry. No rash noted. No erythema.  Psychiatric: She has a normal mood and affect. Her behavior is normal.  Nursing note and vitals reviewed.   ED Course  Procedures (including critical care time) Labs Review Labs Reviewed  [place], ED    Imaging Review No results found.   EKG Interpretation   Date/Time:  Friday May 02 2014 11:51:00 EST Ventricular Rate:  86 PR Interval:  174 QRS Duration: 88 QT Interval:  408 QTC Calculation: 488 R Axis:   37 Text Interpretation:  Normal sinus rhythm ST elevation consider lateral  injury or acute infarct ACUTE MI   Abnormal ECG  since last tracing no significant change Confirmed by Caison Hearn  MD, Evania Lyne  ([id number]) on 05/02/2014 12:06:59 PM      MDM   Final diagnoses:  ST elevation myocardial  infarction (STEMI), unspecified artery    [place] STEMI - asa given pta - CAth lab - immediate transfer - very sick, awake with mormal VS.  Dr. Irish Lack waiting in the lab.      Johnna Acosta, MD 05/02/14 [phone]

## 2014-05-02 NOTE — CV Procedure (Signed)
PROCEDURE:  Left heart catheterization with selective coronary angiography, aspiration thrombectomy of the LAD; PCI LAD.  INDICATIONS:  Anterior STEMI  The risks, benefits, and details of the procedure were explained to the patient.  The patient verbalized understanding and wanted to proceed.  Emergency consent was obtained.  PROCEDURE TECHNIQUE:  After Xylocaine anesthesia a 57F slender sheath was placed in the right radial artery with a single anterior needle wall stick.   IV Heparin was given.  Right coronary angiography was done using a Judkins R4 guide catheter.  Left coronary angiography was done using a Judkins L3.5 guide catheter.  Left ventriculography was done using a pigtail catheter.  A TR band was used for hemostasis.   CONTRAST:  Total of 80 cc.  COMPLICATIONS:  None.    HEMODYNAMICS:  Aortic pressure was 132/67; LV pressure was 136/19; LVEDP 34.  There was no gradient between the left ventricle and aorta.    ANGIOGRAPHIC DATA:   The left main coronary artery is widely patent.  The left anterior descending artery is a large vessel. The vessel is occluded in the midportion. After intervention, it was noted that there was disease just past the occlusion. The remainder of the mid to distal LAD was widely patent. There were several small diagonals which were patent.  The left circumflex artery is a large vessel with mild irregularities proximally. There is a large first obtuse marginal which is widely patent. The second obtuse marginal is large and widely patent.  The right coronary artery is a large dominant vessel. In the mid vessel, there is an eccentric 40% stenosis. The posterior descending artery is large and widely patent. The posterior lateral artery is large and widely patent.  LEFT VENTRICULOGRAM:  Left ventricular angiogram was not done due to elevated LVEDP.  LVEDP was 34 mmHg.  PCI NARRATIVE: A CLS 3.0 guiding catheter was used to engage the left main. A  pro-water wire was placed across the area disease in the LAD. A fetch thrombectomy catheter was used to perform aspiration thrombectomy. There was significant thrombus removal. A second pass was performed with even more thrombus removal. TIMI-3 flow was restored after the first pass. There was a residual mid vessel lesion which was about 12 mm in length, up to 80%. Just past this, there was another segment of moderate disease. The more proximal area was ballooned with a 2.5 x 12 balloon. The entire area was stented with a 2.75 x 28 Promus drug-eluting stent, postdilated to greater than 3 mm in diameter. There was some sluggish flow in the very apical LAD. Multiple doses of IC adenosine were given. TIMI-3 flow was present at the end of the procedure. The patient was pain-free.  IMPRESSIONS:  1. Normal left main coronary artery. 2. Occluded mid left anterior descending artery which was the culprit for today's presentation. This was successfully treated with a 2.75 x 28 Promus drug-eluting stent, postdilated to greater than 3 mm in diameter. 3. Mild disease in the left circumflex artery and its branches. 4. Mild to moderate disease in the right coronary artery. 5. Left ventricular systolic function was not assessed.  LVEDP 34 mmHg.    RECOMMENDATION:  We'll watch her in the ICU. Start low-dose beta blocker. Start high-dose statin. She needs aggressive secondary prevention. Of note, during the case, we checked about the safety of dual antiplatelet therapy with Dr. Ellene Route, as the patient has small brain aneurysms per her report. She was thought to  be appropriate for prolonged dual antiplatelet therapy.  She will need dual antiplatelet therapy for at least a year. Check echocardiogram to evaluate LV function.  We'll run Angiomax at the reduced rate until the bag runs out.

## 2014-05-02 NOTE — Progress Notes (Signed)
  Echocardiogram 2D Echocardiogram has been performed.  Casey Avila 05/02/2014, 4:07 PM

## 2014-05-02 NOTE — ED Notes (Signed)
Pt transported to cath lab with RN , tech , Cath lab team Member , pt placed on O 2 and zoll

## 2014-05-02 NOTE — Progress Notes (Signed)
Chaplain responded to code stemi. Escorted family to second floor admissions. Pt family drove pt here. Chaplain made family aware of chaplain services should they need them. Page chaplain as needed.    05/02/14 1200  Clinical Encounter Type  Visited With Family  Visit Type Code  Spiritual Encounters  Spiritual Needs Emotional  Stress Factors  Family Stress Factors Health changes  Audry Kauzlarich, Epifanio Lesches 05/02/2014 3:14 PM

## 2014-05-02 NOTE — ED Notes (Signed)
Pt received asa from [facility] hospital , heparin not given in the ED , Cath lab stated that they would give it .

## 2014-05-02 NOTE — ED Notes (Signed)
She c/o onset chest pain while at rest this morning. She can not describe the pain just states "it hurts and its going down both my arms, it got worse when I tried to lay down." [facility] her husband states "they were not doing anything for her there so we took her out and brought her here." she states they did give her 324 mg ASA and 1 SL nitro but the pain persisted. She felt her BP was elevated this morning also

## 2014-05-02 NOTE — H&P (Addendum)
Casey Avila is an 68 y.o. female.   Primary Cardiologist: Chan Sheahan-new  Chief Complaint: Chest pain HPI: 68 year old woman with no prior cardiac history. She has had intermittent chest discomfort over the past month. She woke up this morning about 7:30 and had discomfort in the center of her chest. There was heaviness in both arms and shortness of breath associated. She has also been more short of breath when she walks of late. She went to [facility] but then left AMA to come to [facility] for further evaluation. Initial ECG revealed anterior ST elevations. There was some lateral ST elevation in inferior ST depression. She is brought emergently to the cardiac Cath Lab for further evaluation.  She currently has 10 out of 10 chest discomfort. No nausea or vomiting. She denies any prior bleeding problems. She does mention that she has 2 small aneurysms in her brain which are followed by neurosurgery.  Past Medical History  Diagnosis Date  . Aneurysm     brain  . ST elevation (STEMI) myocardial infarction involving left anterior descending coronary artery     Past Surgical History  Procedure Laterality Date  . Neck surgery      History reviewed. No pertinent family history. Social History:  reports that she has been smoking Cigarettes.  She has been smoking about 0.00 packs per day. She does not have any smokeless tobacco history on file. She reports that she does not drink alcohol or use illicit drugs.  Allergies:  Allergies  Allergen Reactions  . Morphine And Related     Makes me sick     Medications Prior to Admission  Medication Sig Dispense Refill  . diltiazem (DILACOR XR) 180 MG 24 hr capsule Take 180 mg by mouth daily.    Marland Kitchen LACTOBACILLUS PO Take 2 capsules by mouth daily.    Marland Kitchen LORazepam (ATIVAN) 0.5 MG tablet Take 0.5 mg by mouth 2 (two) times daily.    Marland Kitchen omeprazole (PRILOSEC) 20 MG capsule Take 20 mg by mouth 2 (two) times daily before a meal.    . pravastatin  (PRAVACHOL) 20 MG tablet Take 20 mg by mouth daily.    Marland Kitchen topiramate (TOPAMAX) 100 MG tablet Take 100 mg by mouth daily.    Marland Kitchen venlafaxine (EFFEXOR) 37.5 MG tablet Take 37.5 mg by mouth 2 (two) times daily.      Results for orders placed or performed during the hospital encounter of 05/02/14 (from the past 48 hour(s))  CBC     Status: Abnormal   Collection Time: 05/02/14 11:49 AM  Result Value Ref Range   WBC 15.1 (H) 4.0 - 10.5 K/uL   RBC 4.52 3.87 - 5.11 MIL/uL   Hemoglobin 14.0 12.0 - 15.0 g/dL   HCT 40.3 36.0 - 46.0 %   MCV 89.2 78.0 - 100.0 fL   MCH 31.0 26.0 - 34.0 pg   MCHC 34.7 30.0 - 36.0 g/dL   RDW 12.8 11.5 - 15.5 %   Platelets 263 150 - 400 K/uL  Basic metabolic panel     Status: Abnormal   Collection Time: 05/02/14 11:49 AM  Result Value Ref Range   Sodium 142 137 - 147 mEq/L   Potassium 3.7 3.7 - 5.3 mEq/L   Chloride 104 96 - 112 mEq/L   CO2 21 19 - 32 mEq/L   Glucose, Bld 175 (H) 70 - 99 mg/dL   BUN 13 6 - 23 mg/dL   Creatinine, Ser 0.63 0.50 - 1.10 mg/dL   Calcium  9.6 8.4 - 10.5 mg/dL   GFR calc non Af Amer >90 >90 mL/min   GFR calc Af Amer >90 >90 mL/min    Comment: (NOTE) The eGFR has been calculated using the CKD EPI equation. This calculation has not been validated in all clinical situations. eGFR's persistently <90 mL/min signify possible Chronic Kidney Disease.    Anion gap 17 (H) 5 - 15  I-stat troponin, ED (not at [facility])     Status: None   Collection Time: 05/02/14 12:04 PM  Result Value Ref Range   Troponin i, poc 0.05 0.00 - 0.08 ng/mL   Comment 3            Comment: Due to the release kinetics of cTnI, a negative result within the first hours of the onset of symptoms does not rule out myocardial infarction with certainty. If myocardial infarction is still suspected, repeat the test at appropriate intervals.   I-STAT, chem 8     Status: Abnormal   Collection Time: 05/02/14 12:35 PM  Result Value Ref Range   Sodium 140 137 - 147 mEq/L    Potassium 3.2 (L) 3.7 - 5.3 mEq/L   Chloride 104 96 - 112 mEq/L   BUN 11 6 - 23 mg/dL   Creatinine, Ser 0.60 0.50 - 1.10 mg/dL   Glucose, Bld 178 (H) 70 - 99 mg/dL   Calcium, Ion 1.22 1.13 - 1.30 mmol/L   TCO2 19 0 - 100 mmol/L   Hemoglobin 13.9 12.0 - 15.0 g/dL   HCT 41.0 36.0 - 46.0 %  MRSA PCR Screening     Status: None   Collection Time: 05/02/14  1:48 PM  Result Value Ref Range   MRSA by PCR NEGATIVE NEGATIVE    Comment:        The GeneXpert MRSA Assay (FDA approved for NASAL specimens only), is one component of a comprehensive MRSA colonization surveillance program. It is not intended to diagnose MRSA infection nor to guide or monitor treatment for MRSA infections.   CBC     Status: Abnormal   Collection Time: 05/02/14  2:43 PM  Result Value Ref Range   WBC 14.5 (H) 4.0 - 10.5 K/uL   RBC 4.49 3.87 - 5.11 MIL/uL   Hemoglobin 14.1 12.0 - 15.0 g/dL   HCT 39.5 36.0 - 46.0 %   MCV 88.0 78.0 - 100.0 fL   MCH 31.4 26.0 - 34.0 pg   MCHC 35.7 30.0 - 36.0 g/dL   RDW 12.8 11.5 - 15.5 %   Platelets 226 150 - 400 K/uL  Creatinine, serum     Status: None   Collection Time: 05/02/14  2:43 PM  Result Value Ref Range   Creatinine, Ser 0.57 0.50 - 1.10 mg/dL   GFR calc non Af Amer >90 >90 mL/min   GFR calc Af Amer >90 >90 mL/min    Comment: (NOTE) The eGFR has been calculated using the CKD EPI equation. This calculation has not been validated in all clinical situations. eGFR's persistently <90 mL/min signify possible Chronic Kidney Disease.   CK total and CKMB     Status: Abnormal   Collection Time: 05/02/14  2:43 PM  Result Value Ref Range   Total CK 973 (H) 7 - 177 U/L   CK, MB 106.6 (HH) 0.3 - 4.0 ng/mL    Comment: CRITICAL RESULT CALLED TO, READ BACK BY AND VERIFIED WITH: Mamie Levers [id number] 05/02/14 [id number]    Relative Index 11.0 (H) 0.0 - 2.5  Protime-INR  Status: Abnormal   Collection Time: 05/02/14  2:45 PM  Result Value Ref Range   Prothrombin Time 16.4 (H) 11.6  - 15.2 seconds   INR 1.31 0.00 - 1.49   Dg Chest Portable 1 View  05/02/2014   CLINICAL DATA:  Chest pain.  EXAM: PORTABLE CHEST - 1 VIEW  COMPARISON:  May 02, 2014.  FINDINGS: The heart size and mediastinal contours are within normal limits. Both lungs are clear. No pneumothorax or pleural effusion is noted. The visualized skeletal structures are unremarkable.  IMPRESSION: No acute cardiopulmonary abnormality seen.   Electronically Signed   By: Sabino Dick M.D.   On: 05/02/2014 14:31    ROS: As above, chest discomfort, no bleeding issues. All other systems negative  OBJECTIVE:   Vitals:   Filed Vitals:   05/02/14 1615 05/02/14 1645 05/02/14 1715 05/02/14 1800  BP:  143/80 139/77 146/83  Pulse: 100 88 91 95  Temp:      TempSrc:      Resp: _0 Weight:      SpO2: 96% 97% 96% 95%   I&O's:   Intake/Output Summary (Last 24 hours) at 05/02/14 1817 Last data filed at 05/02/14 1500  Gross per 24 hour  Intake  73.75 ml  Output      0 ml  Net  73.75 ml   TELEMETRY: Reviewed telemetry pt in normal sinus rhythm:     PHYSICAL EXAM General: Well developed, well nourished, in no acute distress Head:   Normal cephalic and atramatic  Lungs:  No wheezing Heart:  HRRR    Abdomen: abdomen soft and non-tender  Extremities:  2+ right radial pulse Neuro: Alert and oriented. Psych:  Normal affect, responds appropriately  LABS: Basic Metabolic Panel:  Recent Labs  05/02/14 1149 05/02/14 1235 05/02/14 1443  NA 142 140  --   K 3.7 3.2*  --   CL 104 104  --   CO2 21  --   --   GLUCOSE 175* 178*  --   BUN 13 11  --   CREATININE 0.63 0.60 0.57  CALCIUM 9.6  --   --    Liver Function Tests: No results for input(s): AST, ALT, ALKPHOS, BILITOT, PROT, ALBUMIN in the last 72 hours. No results for input(s): LIPASE, AMYLASE in the last 72 hours. CBC:  Recent Labs  05/02/14 1149 05/02/14 1235 05/02/14 1443  WBC 15.1*  --  14.5*  HGB 14.0 13.9 14.1  HCT 40.3 41.0  39.5  MCV 89.2  --  88.0  PLT 263  --  226   Cardiac Enzymes:  BNP: Invalid input(s): POCBNP D-Dimer: No results for input(s): DDIMER in the last 72 hours. Hemoglobin A1C: No results for input(s): HGBA1C in the last 72 hours. Fasting Lipid Panel: No results for input(s): CHOL, HDL, LDLCALC, TRIG, CHOLHDL, LDLDIRECT in the last 72 hours. Thyroid Function Tests: No results for input(s): TSH, T4TOTAL, T3FREE, THYROIDAB in the last 72 hours.  Invalid input(s): FREET3 Anemia Panel: No results for input(s): VITAMINB12, FOLATE, FERRITIN, TIBC, IRON, RETICCTPCT in the last 72 hours. Coag Panel:   Lab Results  Component Value Date   INR 1.31 05/02/2014       Assessment/Plan Acute anterior ST elevation MI: Plan for emergent cardiac catheterization with possible PCI. We'll contact neurosurgery to see if she would be a candidate for dual antiplatelet therapy. This will be done by the staff during the diagnostic catheterization. I suspect she will need PCI. Stent  selection based on neurosurgery's response.  She'll need aggressive secondary prevention. She'll need lipid-lowering therapy as well as blood pressure control and blood sugar control.  Further plans based on results of cardiac cath.  Likely watching ICU. Check cardiac enzymes.  Nel Stoneking S. 05/02/2014, 6:17 PM

## 2014-05-02 NOTE — Progress Notes (Signed)
On call MD made aware of pt BP. New orders received. Will continue to monitor.

## 2014-05-02 NOTE — Progress Notes (Signed)
Utilization review completed.  

## 2014-05-02 NOTE — ED Notes (Signed)
All belongings given to husband including glasses and a ring

## 2014-05-02 NOTE — Progress Notes (Signed)
Pt. Complaining of chest pain 2/10 radiating to neck and head. EKG obtained and Dr. Irish Lack notified. Telephone order received to give dilaudid 1-2mg  Q1 PRN for pain. Will continue to monitor.

## 2014-05-03 ENCOUNTER — Encounter (HOSPITAL_COMMUNITY): Payer: Self-pay | Admitting: *Deleted

## 2014-05-03 DIAGNOSIS — I10 Essential (primary) hypertension: Secondary | ICD-10-CM

## 2014-05-03 DIAGNOSIS — I5021 Acute systolic (congestive) heart failure: Secondary | ICD-10-CM

## 2014-05-03 LAB — BASIC METABOLIC PANEL
Anion gap: 16 — ABNORMAL HIGH (ref 5–15)
BUN: 12 mg/dL (ref 6–23)
CHLORIDE: 102 meq/L (ref 96–112)
CO2: 21 mEq/L (ref 19–32)
Calcium: 9.1 mg/dL (ref 8.4–10.5)
Creatinine, Ser: 0.61 mg/dL (ref 0.50–1.10)
GLUCOSE: 136 mg/dL — AB (ref 70–99)
POTASSIUM: 4.1 meq/L (ref 3.7–5.3)
SODIUM: 139 meq/L (ref 137–147)

## 2014-05-03 LAB — CBC
HCT: 36.6 % (ref 36.0–46.0)
HEMOGLOBIN: 12.7 g/dL (ref 12.0–15.0)
MCH: 30.7 pg (ref 26.0–34.0)
MCHC: 34.7 g/dL (ref 30.0–36.0)
MCV: 88.4 fL (ref 78.0–100.0)
Platelets: 222 10*3/uL (ref 150–400)
RBC: 4.14 MIL/uL (ref 3.87–5.11)
RDW: 12.8 % (ref 11.5–15.5)
WBC: 16.2 10*3/uL — AB (ref 4.0–10.5)

## 2014-05-03 LAB — TROPONIN I

## 2014-05-03 LAB — CK TOTAL AND CKMB (NOT AT ARMC)
CK, MB: 216.4 ng/mL (ref 0.3–4.0)
Relative Index: 14.9 — ABNORMAL HIGH (ref 0.0–2.5)
Total CK: 1457 U/L — ABNORMAL HIGH (ref 7–177)

## 2014-05-03 MED ORDER — POTASSIUM CHLORIDE CRYS ER 20 MEQ PO TBCR
40.0000 meq | EXTENDED_RELEASE_TABLET | Freq: Once | ORAL | Status: AC
  Administered 2014-05-03: 40 meq via ORAL
  Filled 2014-05-03: qty 2

## 2014-05-03 MED ORDER — SPIRONOLACTONE 25 MG PO TABS
12.5000 mg | ORAL_TABLET | Freq: Every day | ORAL | Status: DC
  Administered 2014-05-03 – 2014-05-05 (×3): 12.5 mg via ORAL
  Filled 2014-05-03 (×3): qty 1

## 2014-05-03 MED ORDER — HYDRALAZINE HCL 20 MG/ML IJ SOLN
10.0000 mg | INTRAMUSCULAR | Status: DC | PRN
  Administered 2014-05-03: 10 mg via INTRAVENOUS

## 2014-05-03 MED ORDER — CARVEDILOL 6.25 MG PO TABS
6.2500 mg | ORAL_TABLET | Freq: Two times a day (BID) | ORAL | Status: DC
  Administered 2014-05-03 – 2014-05-04 (×2): 6.25 mg via ORAL
  Filled 2014-05-03 (×5): qty 1

## 2014-05-03 MED ORDER — PANTOPRAZOLE SODIUM 40 MG PO TBEC
40.0000 mg | DELAYED_RELEASE_TABLET | Freq: Every day | ORAL | Status: DC
  Administered 2014-05-03 – 2014-05-05 (×3): 40 mg via ORAL
  Filled 2014-05-03 (×3): qty 1

## 2014-05-03 MED ORDER — HYDROCODONE-ACETAMINOPHEN 5-325 MG PO TABS
1.0000 | ORAL_TABLET | Freq: Four times a day (QID) | ORAL | Status: DC | PRN
  Administered 2014-05-04: 1 via ORAL
  Filled 2014-05-03: qty 1

## 2014-05-03 MED ORDER — FUROSEMIDE 10 MG/ML IJ SOLN
40.0000 mg | Freq: Once | INTRAMUSCULAR | Status: AC
  Administered 2014-05-03: 40 mg via INTRAVENOUS
  Filled 2014-05-03: qty 4

## 2014-05-03 MED ORDER — ZOLPIDEM TARTRATE 5 MG PO TABS
5.0000 mg | ORAL_TABLET | Freq: Every evening | ORAL | Status: DC | PRN
  Administered 2014-05-03 – 2014-05-04 (×2): 5 mg via ORAL
  Filled 2014-05-03 (×2): qty 1

## 2014-05-03 MED ORDER — HYDRALAZINE HCL 20 MG/ML IJ SOLN
INTRAMUSCULAR | Status: AC
  Filled 2014-05-03: qty 1

## 2014-05-03 MED ORDER — ENALAPRIL MALEATE 5 MG PO TABS
5.0000 mg | ORAL_TABLET | Freq: Two times a day (BID) | ORAL | Status: DC
  Administered 2014-05-03 – 2014-05-05 (×4): 5 mg via ORAL
  Filled 2014-05-03 (×6): qty 1

## 2014-05-03 NOTE — Progress Notes (Signed)
Pt had vomiting episode this am after sipping coffee. Pt denies any pain. Morning EKG obtained. Will continue to monitor and report to dayshift RN.

## 2014-05-03 NOTE — Progress Notes (Signed)
Pt sitting upright in bed,eating late lunch The current method of family  at bedside.

## 2014-05-03 NOTE — Progress Notes (Signed)
CARDIAC REHAB PHASE I   PRE:  Rate/Rhythm: ST 110  BP:  Supine:   Sitting: 117/68  Standing:    SaO2: 98  RA   MODE:  Ambulation: 350 ft   POST:  Rate/Rhythm: 115  BP:  Supine:   Sitting:   Standing:    SaO2: 98 RA  Pt up to ambulate in hallway with hand held assist by rehab staff and husband.  Pt reluctant to use the walker due to the amount of pressure exerted on arms may cause neck discomfort.  Pt wears neck brace for support and states she "needs to have neck surgery" in the future.  Pt tolerated well but noticeable shortness of breath with WNL o2 sat on RA 98. Pt back to bed for IV placement.  Pt provided MI booklet along with diet, exercise guidelines and outpatient cardiac rehab phase II.  Pt would like contact information to be sent to [place].  Handouts provided, questions answered.  Family at bedside. Cherre Huger, BSN [phone]

## 2014-05-03 NOTE — Progress Notes (Signed)
Subjective:   68 y/o woman with HTN, tobacco use, cerebral aneursym (follwed medically) admitted 12/11 with anterior STEMI underwent DES to totally occluded mid LAD. Otherwise non-obstructtive CAD. LVEDP 34. EF 40% by echo.  Remains hypertensive. Mild dyspnea. C/o reflux. No HA or angina    Intake/Output Summary (Last 24 hours) at 05/03/14 1045 Last data filed at 05/03/14 0900  Gross per 24 hour  Intake 293.75 ml  Output      0 ml  Net 293.75 ml    Current meds: . antiseptic oral rinse  7 mL Mouth Rinse BID  . aspirin  81 mg Oral Daily  . atorvastatin  80 mg Oral q1800  . diltiazem  180 mg Oral Daily  . heparin  5,000 Units Subcutaneous 3 times per day  . metoprolol tartrate  12.5 mg Oral BID  . ticagrelor  90 mg Oral BID  . topiramate  100 mg Oral Daily   Infusions:     Objective:  Blood pressure 168/90, pulse 94, temperature 98.8 F (37.1 C), temperature source Oral, resp. rate 22, height 5\' 4"  (1.626 m), weight 84.1 kg (185 lb 6.5 oz), SpO2 98 %. Weight change:   Physical Exam: General:  Lying flat in bed. Mildly dyspneic HEENT: normal Neck: supple. JVP 9 . Carotids 2+ bilat; no bruits. No lymphadenopathy or thryomegaly appreciated. Cor: PMI nondisplaced. Regular rate & rhythm. No rubs, gallops or murmurs. Lungs: clear mildly decreased BS. No wheeze Abdomen: obese soft, nontender, nondistended. No hepatosplenomegaly. No bruits or masses. Good bowel sounds. Extremities: no cyanosis, clubbing, rash, 1+ edema Neuro: alert & orientedx3, cranial nerves grossly intact. moves all 4 extremities w/o difficulty. Affect pleasant  Telemetry:  NSR 95  Lab Results: Basic Metabolic Panel:  Recent Labs Lab 05/02/14 1149 05/02/14 1235 05/02/14 1443 05/03/14 0245  NA 142 140  --  139  K 3.7 3.2*  --  4.1  CL 104 104  --  102  CO2 21  --   --  21  GLUCOSE 175* 178*  --  136*  BUN 13 11  --  12  CREATININE 0.63 0.60 0.57 0.61  CALCIUM 9.6  --   --  9.1   Liver  Function Tests: No results for input(s): AST, ALT, ALKPHOS, BILITOT, PROT, ALBUMIN in the last 168 hours. No results for input(s): LIPASE, AMYLASE in the last 168 hours. No results for input(s): AMMONIA in the last 168 hours. CBC:  Recent Labs Lab 05/02/14 1149 05/02/14 1235 05/02/14 1443 05/03/14 0245  WBC 15.1*  --  14.5* 16.2*  HGB 14.0 13.9 14.1 12.7  HCT 40.3 41.0 39.5 36.6  MCV 89.2  --  88.0 88.4  PLT 263  --  226 222   Cardiac Enzymes:  Recent Labs Lab 05/02/14 1443 05/02/14 1921 05/03/14 0245  CKTOTAL 973* 1573* 1457*  CKMB 106.6* 219.8* 216.4*  TROPONINI  --   --  >20.00*   BNP: Invalid input(s): POCBNP CBG: No results for input(s): GLUCAP in the last 168 hours. Microbiology: No results found for: CULT No results for input(s): CULT, SDES in the last 168 hours.  Imaging: Dg Chest Portable 1 View  05/02/2014   CLINICAL DATA:  Chest pain.  EXAM: PORTABLE CHEST - 1 VIEW  COMPARISON:  May 02, 2014.  FINDINGS: The heart size and mediastinal contours are within normal limits. Both lungs are clear. No pneumothorax or pleural effusion is noted. The visualized skeletal structures are unremarkable.  IMPRESSION: No acute cardiopulmonary abnormality seen.  Electronically Signed   By: Sabino Dick M.D.   On: 05/02/2014 14:31     ASSESSMENT:  1. CAD s/p anterior STEMI 12/11    --s/p DES to LAD 2. Acute systolic HF EF 38% 3. HTN, uncontrolled 4. COPD with ongoing tobacco use 5. Cerebral aneurysms  PLAN/DISCUSSION:  She has mild systolic HF in the setting of poorly controlled HTN post MI. EF 40%. Will give one dose lasix. Switch metoprolol to carvedilol. Start enalapril and spiro (Ephesus trial) Need to keep SBP < 140. CR to see. Can move to SDU. Continue Brillinta.    LOS: 1 day    Glori Bickers, MD 05/03/2014, 10:45 AM

## 2014-05-03 NOTE — Progress Notes (Signed)
Critical lab values Troponin <20 and CK, MB 216.4, expected labs. Will continue to monitor.

## 2014-05-03 NOTE — Progress Notes (Signed)
Report called to receiving nurse on unit 3W . Pt aware of pending tx to rm 3W10

## 2014-05-03 NOTE — Progress Notes (Signed)
Conferred with primary RN caring for pt.  Asked to hold on ambulation until later today due to elevation of bp with history of cerebral aneurysm.  Will check back this afternoon. Cherre Huger, BSN

## 2014-05-04 DIAGNOSIS — I509 Heart failure, unspecified: Secondary | ICD-10-CM

## 2014-05-04 DIAGNOSIS — J42 Unspecified chronic bronchitis: Secondary | ICD-10-CM

## 2014-05-04 DIAGNOSIS — I110 Hypertensive heart disease with heart failure: Secondary | ICD-10-CM

## 2014-05-04 LAB — BASIC METABOLIC PANEL
Anion gap: 14 (ref 5–15)
BUN: 14 mg/dL (ref 6–23)
CHLORIDE: 103 meq/L (ref 96–112)
CO2: 23 mEq/L (ref 19–32)
CREATININE: 0.75 mg/dL (ref 0.50–1.10)
Calcium: 9.2 mg/dL (ref 8.4–10.5)
GFR calc non Af Amer: 85 mL/min — ABNORMAL LOW (ref >90)
Glucose, Bld: 112 mg/dL — ABNORMAL HIGH (ref 70–99)
POTASSIUM: 3.6 meq/L — AB (ref 3.7–5.3)
Sodium: 140 mEq/L (ref 137–147)

## 2014-05-04 LAB — GLUCOSE, CAPILLARY: Glucose-Capillary: 139 mg/dL — ABNORMAL HIGH (ref 70–99)

## 2014-05-04 MED ORDER — ALUM & MAG HYDROXIDE-SIMETH 200-200-20 MG/5ML PO SUSP
30.0000 mL | ORAL | Status: DC | PRN
  Filled 2014-05-04: qty 30

## 2014-05-04 MED ORDER — FUROSEMIDE 10 MG/ML IJ SOLN
40.0000 mg | Freq: Once | INTRAMUSCULAR | Status: AC
  Administered 2014-05-04: 40 mg via INTRAVENOUS
  Filled 2014-05-04: qty 4

## 2014-05-04 MED ORDER — ALUM & MAG HYDROXIDE-SIMETH 200-200-20 MG/5ML PO SUSP: 15.0000 mL | ORAL | Status: DC | PRN

## 2014-05-04 MED ORDER — CARVEDILOL 12.5 MG PO TABS
12.5000 mg | ORAL_TABLET | Freq: Two times a day (BID) | ORAL | Status: DC
  Administered 2014-05-04 – 2014-05-05 (×2): 12.5 mg via ORAL
  Filled 2014-05-04 (×2): qty 1

## 2014-05-04 NOTE — Progress Notes (Addendum)
Subjective:   68 y/o woman with HTN, tobacco use, cerebral aneursym (follwed medically) admitted 12/11 with anterior STEMI underwent DES to totally occluded mid LAD. Otherwise non-obstructtive CAD. LVEDP 34. EF 40% by echo.  BP is improved but remains dyspneic. C/o reflux. No HA or angina    Intake/Output Summary (Last 24 hours) at 05/04/14 0921 Last data filed at 05/04/14 0845  Gross per 24 hour  Intake  638.9 ml  Output   4550 ml  Net -3911.1 ml    Current meds: . antiseptic oral rinse  7 mL Mouth Rinse BID  . aspirin  81 mg Oral Daily  . atorvastatin  80 mg Oral q1800  . carvedilol  12.5 mg Oral BID WC  . enalapril  5 mg Oral BID  . furosemide  40 mg Intravenous Once  . heparin  5,000 Units Subcutaneous 3 times per day  . pantoprazole  40 mg Oral Daily  . spironolactone  12.5 mg Oral Daily  . ticagrelor  90 mg Oral BID  . topiramate  100 mg Oral Daily   Infusions:     Objective:  Blood pressure 122/70, pulse 101, temperature 98.7 F (37.1 C), temperature source Oral, resp. rate 22, height 5\' 4"  (1.626 m), weight 175 lb 1.6 oz (79.425 kg), SpO2 99 %. Weight change: -1 lb 5.2 oz (-0.6 kg)  Physical Exam: General:  Lying flat in bed. Mildly dyspneic HEENT: normal Neck: supple. JVP 9 . Carotids 2+ bilat; no bruits. No lymphadenopathy or thryomegaly appreciated. Cor: PMI nondisplaced. Regular rate & rhythm. No rubs, gallops or murmurs. Lungs: clear mildly decreased BS. No wheeze Abdomen: obese soft, nontender, nondistended. No hepatosplenomegaly. No bruits or masses. Good bowel sounds. Extremities: no cyanosis, clubbing, rash, 1+ edema Neuro: alert & orientedx3, cranial nerves grossly intact. moves all 4 extremities w/o difficulty. Affect pleasant  Telemetry:  NSR 95  Lab Results: Basic Metabolic Panel:  Recent Labs Lab 05/02/14 1149 05/02/14 1235 05/02/14 1443 05/03/14 0245 05/04/14 0240  NA 142 140  --  139 140  K 3.7 3.2*  --  4.1 3.6*  CL 104 104   --  102 103  CO2 21  --   --  21 23  GLUCOSE 175* 178*  --  136* 112*  BUN 13 11  --  12 14  CREATININE 0.63 0.60 0.57 0.61 0.75  CALCIUM 9.6  --   --  9.1 9.2   Liver Function Tests: No results for input(s): AST, ALT, ALKPHOS, BILITOT, PROT, ALBUMIN in the last 168 hours. No results for input(s): LIPASE, AMYLASE in the last 168 hours. No results for input(s): AMMONIA in the last 168 hours. CBC:  Recent Labs Lab 05/02/14 1149 05/02/14 1235 05/02/14 1443 05/03/14 0245  WBC 15.1*  --  14.5* 16.2*  HGB 14.0 13.9 14.1 12.7  HCT 40.3 41.0 39.5 36.6  MCV 89.2  --  88.0 88.4  PLT 263  --  226 222   Cardiac Enzymes:  Recent Labs Lab 05/02/14 1443 05/02/14 1921 05/03/14 0245  CKTOTAL 973* 1573* 1457*  CKMB 106.6* 219.8* 216.4*  TROPONINI  --   --  >20.00*   BNP: Invalid input(s): POCBNP CBG: No results for input(s): GLUCAP in the last 168 hours. Microbiology: No results found for: CULT No results for input(s): CULT, SDES in the last 168 hours.  Imaging: Dg Chest Portable 1 View  05/02/2014   CLINICAL DATA:  Chest pain.  EXAM: PORTABLE CHEST - 1 VIEW  COMPARISON:  May 02, 2014.  FINDINGS: The heart size and mediastinal contours are within normal limits. Both lungs are clear. No pneumothorax or pleural effusion is noted. The visualized skeletal structures are unremarkable.  IMPRESSION: No acute cardiopulmonary abnormality seen.   Electronically Signed   By: Sabino Dick M.D.   On: 05/02/2014 14:31     ASSESSMENT/Plan:  1. CAD s/p anterior STEMI 12/11    --s/p DES to LAD    -- stable, increase coreg 2. Acute systolic HF EF 20%    -- remains volume overloaded and dypsneic.  Will give IV lasix again today I worry that her risks of decompensation/ rehospitalization is quite high.  A high level of decision making is required for this patient today. 3. Hypertensive cardiovascular disease with CHF, improved    -- increase coreg 4. COPD with ongoing tobacco use     --  cessation is encouraged 5. Cerebral aneurysms 6. Cardiac rehab encouraged  Possibly home tomorrow    LOS: 2 days    Thompson Grayer, MD 05/04/2014, 9:21 AM

## 2014-05-05 ENCOUNTER — Encounter (HOSPITAL_COMMUNITY): Payer: Self-pay | Admitting: Physician Assistant

## 2014-05-05 DIAGNOSIS — I255 Ischemic cardiomyopathy: Secondary | ICD-10-CM | POA: Diagnosis present

## 2014-05-05 DIAGNOSIS — I5041 Acute combined systolic (congestive) and diastolic (congestive) heart failure: Secondary | ICD-10-CM

## 2014-05-05 DIAGNOSIS — I10 Essential (primary) hypertension: Secondary | ICD-10-CM | POA: Diagnosis present

## 2014-05-05 DIAGNOSIS — I471 Supraventricular tachycardia: Secondary | ICD-10-CM

## 2014-05-05 DIAGNOSIS — R000 Tachycardia, unspecified: Secondary | ICD-10-CM | POA: Diagnosis present

## 2014-05-05 DIAGNOSIS — F17200 Nicotine dependence, unspecified, uncomplicated: Secondary | ICD-10-CM | POA: Diagnosis present

## 2014-05-05 DIAGNOSIS — E785 Hyperlipidemia, unspecified: Secondary | ICD-10-CM | POA: Diagnosis present

## 2014-05-05 LAB — BASIC METABOLIC PANEL
Anion gap: 13 (ref 5–15)
BUN: 15 mg/dL (ref 6–23)
CHLORIDE: 103 meq/L (ref 96–112)
CO2: 22 mEq/L (ref 19–32)
CREATININE: 0.85 mg/dL (ref 0.50–1.10)
Calcium: 9.4 mg/dL (ref 8.4–10.5)
GFR calc non Af Amer: 69 mL/min — ABNORMAL LOW (ref >90)
GFR, EST AFRICAN AMERICAN: 80 mL/min — AB (ref >90)
Glucose, Bld: 117 mg/dL — ABNORMAL HIGH (ref 70–99)
POTASSIUM: 3.7 meq/L (ref 3.7–5.3)
SODIUM: 138 meq/L (ref 137–147)

## 2014-05-05 LAB — HEPATIC FUNCTION PANEL
ALBUMIN: 3.6 g/dL (ref 3.5–5.2)
ALK PHOS: 72 U/L (ref 39–117)
ALT: 75 U/L — ABNORMAL HIGH (ref 0–35)
AST: 60 U/L — ABNORMAL HIGH (ref 0–37)
Bilirubin, Direct: <0.2 mg/dL (ref 0.0–0.3)
TOTAL PROTEIN: 6.6 g/dL (ref 6.0–8.3)
Total Bilirubin: 0.5 mg/dL (ref 0.3–1.2)

## 2014-05-05 LAB — LIPID PANEL
CHOL/HDL RATIO: 3.6 ratio
Cholesterol: 130 mg/dL (ref 0–200)
HDL: 36 mg/dL — ABNORMAL LOW (ref >39)
LDL Cholesterol: 36 mg/dL (ref 0–99)
Triglycerides: 289 mg/dL — ABNORMAL HIGH (ref <150)
VLDL: 58 mg/dL — ABNORMAL HIGH (ref 0–40)

## 2014-05-05 LAB — GLUCOSE, CAPILLARY: Glucose-Capillary: 109 mg/dL — ABNORMAL HIGH (ref 70–99)

## 2014-05-05 MED ORDER — TICAGRELOR 90 MG PO TABS: 90.0000 mg | ORAL_TABLET | Freq: Two times a day (BID) | ORAL | Status: DC

## 2014-05-05 MED ORDER — ATORVASTATIN CALCIUM 80 MG PO TABS: 80.0000 mg | ORAL_TABLET | Freq: Every day | ORAL | Status: DC

## 2014-05-05 MED ORDER — CARVEDILOL 12.5 MG PO TABS: 12.5000 mg | ORAL_TABLET | Freq: Two times a day (BID) | ORAL | Status: DC

## 2014-05-05 MED ORDER — LASIX 20 MG PO TABS: 20.0000 mg | ORAL_TABLET | Freq: Every day | ORAL | Status: DC

## 2014-05-05 MED ORDER — ASPIRIN 81 MG PO CHEW: 81.0000 mg | CHEWABLE_TABLET | Freq: Every day | ORAL | Status: AC

## 2014-05-05 MED ORDER — ENALAPRIL MALEATE 5 MG PO TABS: 5.0000 mg | ORAL_TABLET | Freq: Two times a day (BID) | ORAL | Status: DC

## 2014-05-05 MED ORDER — SPIRONOLACTONE 25 MG PO TABS: 12.5000 mg | ORAL_TABLET | Freq: Every day | ORAL | Status: DC

## 2014-05-05 MED FILL — Sodium Chloride IV Soln 0.9%: INTRAVENOUS | Qty: 50 | Status: AC

## 2014-05-05 NOTE — Progress Notes (Signed)
CARDIAC REHAB PHASE I   PRE:  Rate/Rhythm: 102 ST  BP:  Supine:    Sitting: 94/50  Standing:    SaO2: 98 RA  MODE:  Ambulation: 700 ft   POST:  Rate/Rhythm: 110 ST  BP:  Supine:  Sitting: 106/60  Standing:  1    SaO2: 100 RA 1005-1045 Pt tolerated ambulation well without c/o of cp. HR after walk 110 ST. Pt to chair after walk. Reviewed discharge education with pt and husband. They voice understanding.   Rodney Langton RN 05/05/2014 10:45 AM

## 2014-05-05 NOTE — Care Management Note (Signed)
    Page 1 of 1   05/05/2014     11:06:29 AM CARE MANAGEMENT NOTE 05/05/2014  Patient:  Casey Avila, Casey Avila   Account Number:  [id number]  Date Initiated:  05/05/2014  Documentation initiated by:  GRAVES-BIGELOW,Nesta Kimple  Subjective/Objective Assessment:   Pt admitted for cp-Stemil S/p cardiac cath. Planf ro d/c home today.     Action/Plan:   CM provided pt with the 30 day free brilinta card. [facility] has medication available. No further needs from CM at this time.   Anticipated DC Date:  05/05/2014   Anticipated DC Plan:  [place]  CM consult      Choice offered to / List presented to:             Status of service:  Completed, signed off Medicare Important Message given?  YES (If response is "NO", the following Medicare IM given date fields will be blank) Date Medicare IM given:  05/05/2014 Medicare IM given by:  GRAVES-BIGELOW,Merissa Renwick Date Additional Medicare IM given:   Additional Medicare IM given by:    Discharge Disposition:  HOME/SELF CARE  Per UR Regulation:  Reviewed for med. necessity/level of care/duration of stay  If discussed at [place] of Stay Meetings, dates discussed:    Comments:  S/W [facility] [place] @ [employer] # [phone]  BRILINTA 90 MG BID  COVER-YES CO-PAY- $72.00 FOR 30 DAYS SUPPLY TIER - 1 DRUG PRIOR APPROVAL - NO PHARMACY : [facility], [facility], [facility] AND [facility]

## 2014-05-05 NOTE — Discharge Summary (Addendum)
CARDIOLOGY DISCHARGE SUMMARY   Patient ID: Casey Avila MRN: [id number] DOB/AGE: [birth year] 68 y.o.  Admit date: 05/02/2014 Discharge date: 05/05/2014  PCP: Monico Blitz, MD Primary Cardiologist: will f/u in [place], [place] in [place]  Primary Discharge Diagnosis:  ST elevation (STEMI) myocardial infarction involving left anterior descending coronary artery - s/p 2.75 x 28 Promus DES to the LAD  Secondary Discharge Diagnosis:  Principal Problem:   ST elevation (STEMI) myocardial infarction involving left anterior descending coronary artery Active Problems:   Cardiomyopathy, ischemic - status post anterior STEMI with EF 40%   Acute combined systolic and diastolic heart failure   Essential hypertension   Sinus tachycardia   Hyperlipidemia with target LDL less than 70   Current smoker   Procedures: Left heart catheterization with selective coronary angiography, aspiration thrombectomy of the LAD; PCI LAD, 2 D echocardiogram, CT angiogram of the head with/without contrast, CT of the neck  Hospital Course: Casey Avila is a 68 y.o. female with a history of HTN, tobacco use, cerebral aneursym (follwed medically), but no CAD. She had intermittent chest discomfort but woke up on the day of admission was significantly worse symptoms. She went to [facility] but left AMA and came to [facility]. Her initial ECG showed anterior ST elevation. She was brought emergently to the Cath Lab.  Cardiac catheterization results are below. She had a drug-eluting stent to the LAD. Otherwise, she had nonobstructive disease with a 40% lesion in the RCA. Her LVEDP was elevated at 34 so a left ventriculogram was not performed. She tolerated the procedure well. She was started on a beta blocker and high-dose statin.  A 2-D echocardiogram was performed, results below. Her EF is 40% with grade 1 diastolic dysfunction. She was started on IV Lasix and diuresed 9 pounds during her hospital stay. As her volume  status improved, her respiratory status improved. At discharge, she is transitioned to oral Lasix. She is also on spironolactone and an ACE inhibitor.  She was counseled on smoking cessation and encouraged to stay quit. Her husband and children are supportive of this. She was seen by cardiac rehabilitation and educated on MI restrictions, heart-healthy lifestyle modifications and exercise guidelines. Her family is supportive of her increasing her activity as well.  On 12/14, she was seen by Dr. Ellyn Hack and all data were reviewed. She was ambulating well, without chest pain or shortness of breath. She was tolerating the medication changes also. No further inpatient workup was indicated and she is considered stable for discharge, to follow up as an outpatient. She prefers follow-up in the [place] office.  BP 100/62 mmHg  Pulse 106  Temp(Src) 98.1 F (36.7 C) (Oral)  Resp 18  Ht 5\' 4"  (1.626 m)  Wt 174 lb 3.2 oz (79.017 kg)  BMI 29.89 kg/m2  SpO2 98% General: Well developed, well nourished, female in no acute distress Head: Eyes PERRLA, No xanthomas.   Normocephalic and atraumatic  Lungs: Clear bilaterally to auscultation. Heart: HRRR - rapid S1 S2 normal, without MRG.  Pulses are 2+ & equal.  No JVD.   Abdomen: Bowel sounds are present, abdomen soft and non-tender without masses or  hernias noted. Msk: Normal strength and tone for age. Extremities: No clubbing, cyanosis or edema.    Skin:  No rashes or lesions noted. Neuro: Alert and oriented X 3. Psych:  Good affect, responds appropriately  Labs:  Lab Results  Component Value Date   WBC 16.2* 05/03/2014   HGB 12.7 05/03/2014  HCT 36.6 05/03/2014   MCV 88.4 05/03/2014   PLT 222 05/03/2014     Recent Labs Lab 05/05/14 0401  NA 138  K 3.7  CL 103  CO2 22  BUN 15  CREATININE 0.85  CALCIUM 9.4  PROT 6.6  BILITOT 0.5  ALKPHOS 72  ALT 75*  AST 60*  GLUCOSE 117*    Recent Labs  05/02/14 1443 05/02/14 1921 05/03/14 0245   CKTOTAL 973* 1573* 1457*  CKMB 106.6* 219.8* 216.4*  TROPONINI  --   --  >20.00*   Lipid Panel     Component Value Date/Time   CHOL 130 05/05/2014 0401   TRIG 289* 05/05/2014 0401   HDL 36* 05/05/2014 0401   CHOLHDL 3.6 05/05/2014 0401   VLDL 58* 05/05/2014 0401   LDLCALC 36 05/05/2014 0401     Recent Labs  05/02/14 1445  INR 1.31      Radiology: Ct Angio Head W/cm &/or Wo Cm 04/25/2014   CLINICAL DATA:  Intracranial aneurysm follow-up. 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 Headaches.  EXAM: CT ANGIOGRAPHY HEAD  TECHNIQUE: Multidetector CT imaging of the head was performed using the standard protocol during bolus administration of intravenous contrast. Multiplanar CT image reconstructions and MIPs were obtained to evaluate the vascular anatomy.  CONTRAST:  58mL OMNIPAQUE IOHEXOL 350 MG/ML SOLN  COMPARISON:  01/24/2013  FINDINGS: There is no evidence of acute cortical infarct, intracranial hemorrhage, mass, midline shift, or extra-axial fluid collection. Ventricles and sulci are within normal limits for age. Mild periventricular white matter hypodensities are similar to the prior study and nonspecific but compatible with mild chronic small vessel ischemic disease. There is no abnormal enhancement. Mastoid air cells and visualized paranasal sinuses are clear.  Visualized distal vertebral arteries are patent and codominant. PICAs appear patent, with the right sharing a common origin with the right AICA. SCA origins are patent. Basilar artery is patent without stenosis. PCAs are unremarkable.  Internal carotid arteries are patent from skullbase to carotid termini. There is mild atherosclerotic irregularity of both carotid siphons without stenosis. 3 mm long x 1 mm wide inferiorly directed vascular structure in the left posterior communicating artery origin region is unchanged and compatible with a small aneurysm. 3 x 4 mm anteroinferiorly directed aneurysm from the  right MCA trifurcation is unchanged. ACAs and MCAs are otherwise unremarkable.  Review of the MIP images confirms the above findings.  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   Electronically Signed   By: Logan Bores   On: 04/25/2014 10:26   Ct Cervical Spine Wo Contrast 04/11/2014   CLINICAL DATA:  Neck pain. BILATERAL arm pain and weakness. Previous fusion.  EXAM: CT CERVICAL SPINE WITHOUT CONTRAST  TECHNIQUE: Multidetector CT imaging of the cervical spine was performed without intravenous contrast. Multiplanar CT image reconstructions were also generated.  COMPARISON:  Plain films 07/23/2013.  MRI cervical spine 02/24/2012.  FINDINGS: Alignment: Anatomic.  Vertebrae: No worrisome osseous lesions.  Hardware: : Intact. A plate across the lower portion of the fusion is intact between C6 and C7. An additional anterior plate is seen between C4-C5, also intact.  Paraspinal tissues: No neck mass is seen. There is moderate atheromatous change at the carotid bifurcations.  Disc levels:  The individual disc spaces were examined as follows:  C2-3:  Normal.  C3-4: Shallow protrusion is suspected. Asymmetric LEFT-sided facet arthropathy without definite foraminal narrowing.  C4-5: Pseudarthrosis. No  solid bony bridging across the interspace. Subsidence of the interbody cage or graft. Asymmetric RIGHT-sided uncinate spurring without significant foraminal narrowing.  C5-6:  Solid fusion.  No impingement.  C6-7:  Solid fusion.  No impingement.  C7-T1:  BILATERAL facet arthropathy.  No impingement.  Advanced facet arthropathy is also observed at T1-T2.  IMPRESSION: Pseudarthrosis C4-C5. Mild RIGHT-sided uncinate spurring without significant foraminal narrowing.  Unremarkable C5-C7 fusion.  Cervicothoracic facet arthropathy.   Electronically Signed   By: Rolla Flatten M.D.   On: 04/11/2014 09:01   Dg Chest Portable 1 View 05/02/2014   CLINICAL  DATA:  Chest pain.  EXAM: PORTABLE CHEST - 1 VIEW  COMPARISON:  May 02, 2014.  FINDINGS: The heart size and mediastinal contours are within normal limits. Both lungs are clear. No pneumothorax or pleural effusion is noted. The visualized skeletal structures are unremarkable.  IMPRESSION: No acute cardiopulmonary abnormality seen.   Electronically Signed   By: Sabino Dick M.D.   On: 05/02/2014 14:31    Cardiac Cath: 05/02/2014 ANGIOGRAPHIC DATA: The left main coronary artery is widely patent. The left anterior descending artery is a large vessel. The vessel is occluded in the midportion. After intervention, it was noted that there was disease just past the occlusion. The remainder of the mid to distal LAD was widely patent. There were several small diagonals which were patent. The left circumflex artery is a large vessel with mild irregularities proximally. There is a large first obtuse marginal which is widely patent. The second obtuse marginal is large and widely patent. The right coronary artery is a large dominant vessel. In the mid vessel, there is an eccentric 40% stenosis. The posterior descending artery is large and widely patent. The posterior lateral artery is large and widely patent. LEFT VENTRICULOGRAM: Left ventricular angiogram was not done due to elevated LVEDP. LVEDP was 34 mmHg. IMPRESSIONS: 1. Normal left main coronary artery. 2. Occluded mid left anterior descending artery which was the culprit for today's presentation. This was successfully treated with a 2.75 x 28 Promus drug-eluting stent, postdilated to greater than 3 mm in diameter. 3. Mild disease in the left circumflex artery and its branches. 4. Mild to moderate disease in the right coronary artery. 5. Left ventricular systolic function was not assessed. LVEDP 34 mmHg. RECOMMENDATION: We'll watch her in the ICU. Start low-dose beta blocker. Start high-dose statin. She needs aggressive secondary prevention. Of  note, during the case, we checked about the safety of dual antiplatelet therapy with Dr. Ellene Route, as the patient has small brain aneurysms per her report. She was thought to be appropriate for prolonged dual antiplatelet therapy. She will need dual antiplatelet therapy for at least a year. Check echocardiogram to evaluate LV function.  EKG: 05/03/2014 Sinus rhythm, rate 89  Echo: 05/02/2014 Conclusions - Left ventricle: Can not rule out apical clot. Severe hypokinesis mid/apical inferior segments, mid/apical anterior segments, mid anteroseptal segment. The cavity size was normal. Wall thickness was increased in a pattern of mild LVH. The estimated ejection fraction was 40%. Doppler parameters are consistent with abnormal left ventricular relaxation (grade 1 diastolic dysfunction). - Right ventricle: The cavity size was normal. Systolic function was normal.  FOLLOW UP PLANS AND APPOINTMENTS Allergies  Allergen Reactions  . Clindamycin/Lincomycin Itching and Rash    Had to go into ER   . Bactrim [Sulfamethoxazole-Trimethoprim] Rash  . Lipitor [Atorvastatin] Other (See Comments)    Aching   . Morphine And Related     Makes me sick  Medication List    STOP taking these medications        diltiazem 180 MG 24 hr capsule  Commonly known as:  DILACOR XR     pravastatin 20 MG tablet  Commonly known as:  PRAVACHOL      TAKE these medications        aspirin 81 MG chewable tablet  Chew 1 tablet (81 mg total) by mouth daily.     atorvastatin 80 MG tablet  Commonly known as:  LIPITOR  Take 1 tablet (80 mg total) by mouth daily at 6 PM.     carvedilol 12.5 MG tablet  Commonly known as:  COREG  Take 1 tablet (12.5 mg total) by mouth 2 (two) times daily with a meal.     clonazePAM 0.5 MG tablet  Commonly known as:  KLONOPIN  Take 0.5 mg by mouth 2 (two) times daily as needed for anxiety.     enalapril 5 MG tablet  Commonly known as:  VASOTEC  Take 1 tablet (5  mg total) by mouth 2 (two) times daily.     LACTOBACILLUS PO  Take 2 capsules by mouth daily.     LASIX 20 MG tablet  Generic drug:  furosemide  Take 1 tablet (20 mg total) by mouth daily. Take 1 extra tab if you gain 3 pounds in a day, take 2 tabs am, 1 tab pm if you gain 5 pounds     LORazepam 0.5 MG tablet  Commonly known as:  ATIVAN  Take 0.5 mg by mouth 2 (two) times daily.     omeprazole 20 MG capsule  Commonly known as:  PRILOSEC  Take 20 mg by mouth 2 (two) times daily before a meal.     PRO-BIOTIC BLEND PO  Take 1 tablet by mouth daily.     spironolactone 25 MG tablet  Commonly known as:  ALDACTONE  Take 0.5 tablets (12.5 mg total) by mouth daily.     ticagrelor 90 MG Tabs tablet  Commonly known as:  BRILINTA  Take 1 tablet (90 mg total) by mouth 2 (two) times daily.     topiramate 100 MG tablet  Commonly known as:  TOPAMAX  Take 100 mg by mouth daily.     venlafaxine 37.5 MG tablet  Commonly known as:  EFFEXOR  Take 37.5 mg by mouth daily.        Discharge Instructions    (HEART FAILURE PATIENTS) Call MD:  Anytime you have any of the following symptoms: 1) 3 pound weight gain in 24 hours or 5 pounds in 1 week 2) shortness of breath, with or without a dry hacking cough 3) swelling in the hands, feet or stomach 4) if you have to sleep on extra pillows at night in order to breathe.    Complete by:  As directed      Amb Referral to Cardiac Rehabilitation    Complete by:  As directed      Diet - low sodium heart healthy    Complete by:  As directed      Increase activity slowly    Complete by:  As directed           Follow-up Information    Follow up with Arnoldo Lenis, MD On 05/16/2014.   Specialty:  Cardiology   Why:  at 11:20 am   Contact information:   [place] [address] [phone]       [place]  MEDICATIONS WITH YOU TO FOLLOW UP APPOINTMENTS  Time spent with patient to include physician time:  18min Signed: Rosaria Ferries, PA-C 05/05/2014, 11:00 AM   Co-Sign MD  Attending Attestation: I saw examined the patient this morning along with Rosaria Ferries, PA-C. I have reviewed all of the available data and chart.   The patient has remained stable now following her myocardial infarction. She does have new diagnosis of ischemic cardiomyopathy with MI related acute combined systolic and diastolic heart failure and responded well to IV Lasix yesterday. She seems euvolemic on exam, but we will start baseline standing oral Lasix with sliding-scale recommendations. She has baseline tachycardia at home and does have persistent tachycardia here. While walking her RA was able to get into the 1:15 on a decent dose of beta blocker. She is also one twice a day enalapril as well as spironolactone per heart failure recommendations.  She is on dual antiplatelet therapy for her stent with aspirin and Brilinta.  She is also on high-dose statin.  Smoking cessation counseling was provided while inpatient. I discussed with her the importance of smoking cessation as well.  As she has been stable and waiting in the hallway today. I think she is fine for discharge home. She will follow-up with Dr. Harl Bowie in the [place] office as this is closer to home. She will be doing rehabilitation in the [facility] cardiac rehabilitation center.  Leonie Man, M.D., M.S. Interventional Cardiologist   Pager # [phone]

## 2014-05-05 NOTE — Discharge Instructions (Signed)
PLEASE REMEMBER TO BRING ALL OF YOUR MEDICATIONS TO EACH OF YOUR FOLLOW-UP OFFICE VISITS. ° °PLEASE ATTEND ALL SCHEDULED FOLLOW-UP APPOINTMENTS.  ° °Activity: Increase activity slowly as tolerated. You may shower, but no soaking baths (or swimming) for 1 week. No driving for 1 week. No lifting over 5 lbs for 2 weeks. No sexual activity for 1 week.  ° °You May Return to Work: in 3 weeks (if applicable) ° °Wound Care: You may wash cath site gently with soap and water. Keep cath site clean and dry. If you notice pain, swelling, bleeding or pus at your cath site, please call [phone]. ° ° ° °Cardiac Cath Site Care °Refer to this sheet in the next few weeks. These instructions provide you with information on caring for yourself after your procedure. Your caregiver may also give you more specific instructions. Your treatment has been planned according to current medical practices, but problems sometimes occur. Call your caregiver if you have any problems or questions after your procedure. °HOME CARE INSTRUCTIONS °· You may shower 24 hours after the procedure. Remove the bandage (dressing) and gently wash the site with plain soap and water. Gently pat the site dry.  °· Do not apply powder or lotion to the site.  °· Do not sit in a bathtub, swimming pool, or whirlpool for 5 to 7 days.  °· No bending, squatting, or lifting anything over 10 pounds (4.5 kg) as directed by your caregiver.  °· Inspect the site at least twice daily.  °· Do not drive home if you are discharged the same day of the procedure. Have someone else drive you.  °· You may drive 24 hours after the procedure unless otherwise instructed by your caregiver.  °What to expect: °· Any bruising will usually fade within 1 to 2 weeks.  °· Blood that collects in the tissue (hematoma) may be painful to the touch. It should usually decrease in size and tenderness within 1 to 2 weeks.  °SEEK IMMEDIATE MEDICAL CARE IF: °· You have unusual pain at the site or down the  affected limb.  °· You have redness, warmth, swelling, or pain at the site.  °· You have drainage (other than a small amount of blood on the dressing).  °· You have chills.  °· You have a fever or persistent symptoms for more than 72 hours.  °· You have a fever and your symptoms suddenly get worse.  °· Your leg becomes pale, cool, tingly, or numb.  °· You have heavy bleeding from the site. Hold pressure on the site.  °Document Released: 06/11/2010 Document Revised: 04/28/2011 Document Reviewed:  ° °

## 2014-05-09 ENCOUNTER — Ambulatory Visit (INDEPENDENT_AMBULATORY_CARE_PROVIDER_SITE_OTHER): Payer: Medicare Other | Admitting: Cardiology

## 2014-05-09 ENCOUNTER — Encounter: Payer: Self-pay | Admitting: Cardiology

## 2014-05-09 VITALS — BP 88/54 | HR 85 | Ht 64.0 in | Wt 176.0 lb

## 2014-05-09 DIAGNOSIS — I2102 ST elevation (STEMI) myocardial infarction involving left anterior descending coronary artery: Secondary | ICD-10-CM

## 2014-05-09 DIAGNOSIS — I2510 Atherosclerotic heart disease of native coronary artery without angina pectoris: Secondary | ICD-10-CM

## 2014-05-09 DIAGNOSIS — Z720 Tobacco use: Secondary | ICD-10-CM

## 2014-05-09 DIAGNOSIS — I5022 Chronic systolic (congestive) heart failure: Secondary | ICD-10-CM

## 2014-05-09 MED ORDER — FUROSEMIDE 20 MG PO TABS: 20.0000 mg | ORAL_TABLET | ORAL | Status: DC | PRN

## 2014-05-09 MED ORDER — TICAGRELOR 90 MG PO TABS: 90.0000 mg | ORAL_TABLET | Freq: Two times a day (BID) | ORAL | Status: DC

## 2014-05-09 NOTE — Progress Notes (Signed)
Clinical Summary Casey Avila is a 68 y.o.female seen today for hospital follow up, this is our first visit together.  1. CAD/ICM - admitted with anterior STEMI 05/02/14, s/p DES to LAD. LVEF 40% by echo.  - denies any significant chest pain, can have some occasional pins/needles feeling in chest.  - compliant with meds - referred for cardiac rehab  2. HTN - bps 100s/70s at home - no lightheadness or dizziness  3. Tobacco - stopped smoking after MI     Past Medical History  Diagnosis Date  . Aneurysm     brain aneurysms x 2 x several yrs  . ST elevation (STEMI) myocardial infarction involving left anterior descending coronary artery 05/02/2014  . Ischemic cardiomyopathy 05/02/2014  . HTN (hypertension)      Allergies  Allergen Reactions  . Clindamycin/Lincomycin Itching and Rash    Had to go into ER   . Bactrim [Sulfamethoxazole-Trimethoprim] Rash  . Lipitor [Atorvastatin] Other (See Comments)    Aching   . Morphine And Related     Makes me sick      Current Outpatient Prescriptions  Medication Sig Dispense Refill  . aspirin 81 MG chewable tablet Chew 1 tablet (81 mg total) by mouth daily.    Marland Kitchen atorvastatin (LIPITOR) 80 MG tablet Take 1 tablet (80 mg total) by mouth daily at 6 PM. 90 tablet 3  . carvedilol (COREG) 12.5 MG tablet Take 1 tablet (12.5 mg total) by mouth 2 (two) times daily with a meal. 180 tablet 3  . clonazePAM (KLONOPIN) 0.5 MG tablet Take 0.5 mg by mouth 2 (two) times daily as needed for anxiety.    . enalapril (VASOTEC) 5 MG tablet Take 1 tablet (5 mg total) by mouth 2 (two) times daily. 180 tablet 3  . LACTOBACILLUS PO Take 2 capsules by mouth daily.    Marland Kitchen LASIX 20 MG tablet Take 1 tablet (20 mg total) by mouth daily. Take 1 extra tab if you gain 3 pounds in a day, take 2 tabs am, 1 tab pm if you gain 5 pounds 180 tablet 3  . LORazepam (ATIVAN) 0.5 MG tablet Take 0.5 mg by mouth 2 (two) times daily.    Marland Kitchen omeprazole (PRILOSEC) 20 MG capsule  Take 20 mg by mouth 2 (two) times daily before a meal.    . Probiotic Product (PRO-BIOTIC BLEND PO) Take 1 tablet by mouth daily.    Marland Kitchen spironolactone (ALDACTONE) 25 MG tablet Take 0.5 tablets (12.5 mg total) by mouth daily. 90 tablet 3  . ticagrelor (BRILINTA) 90 MG TABS tablet Take 1 tablet (90 mg total) by mouth 2 (two) times daily. 180 tablet 3  . topiramate (TOPAMAX) 100 MG tablet Take 100 mg by mouth daily.    Marland Kitchen venlafaxine (EFFEXOR) 37.5 MG tablet Take 37.5 mg by mouth daily.      No current facility-administered medications for this visit.     Past Surgical History  Procedure Laterality Date  . Neck surgery    . Left heart catheterization with coronary angiogram N/A 05/02/2014    Procedure: LEFT HEART CATHETERIZATION WITH CORONARY ANGIOGRAM;  Surgeon: Jettie Booze, MD;  Location: [facility] CATH LAB;  Service: Cardiovascular;  Laterality: N/A;     Allergies  Allergen Reactions  . Clindamycin/Lincomycin Itching and Rash    Had to go into ER   . Bactrim [Sulfamethoxazole-Trimethoprim] Rash  . Lipitor [Atorvastatin] Other (See Comments)    Aching   . Morphine And Related  Makes me sick       No family history on file.   Social History Casey Avila reports that she has been smoking Cigarettes.  She has been smoking about 0.00 packs per day. She does not have any smokeless tobacco history on file. Casey Avila reports that she does not drink alcohol.   Review of Systems CONSTITUTIONAL: No weight loss, fever, chills, weakness or fatigue.  HEENT: Eyes: No visual loss, blurred vision, double vision or yellow sclerae.No hearing loss, sneezing, congestion, runny nose or sore throat.  SKIN: No rash or itching.  CARDIOVASCULAR: per HPI RESPIRATORY: No shortness of breath, cough or sputum.  GASTROINTESTINAL: No anorexia, nausea, vomiting or diarrhea. No abdominal pain or blood.  GENITOURINARY: No burning on urination, no polyuria NEUROLOGICAL: No headache, dizziness, syncope,  paralysis, ataxia, numbness or tingling in the extremities. No change in bowel or bladder control.  MUSCULOSKELETAL: No muscle, back pain, joint pain or stiffness.  LYMPHATICS: No enlarged nodes. No history of splenectomy.  PSYCHIATRIC: No history of depression or anxiety.  ENDOCRINOLOGIC: No reports of sweating, cold or heat intolerance. No polyuria or polydipsia.  Marland Kitchen   Physical Examination p 85 bp 88/54 Wt 176 lbs BMI 30 Gen: resting comfortably, no acute distress HEENT: no scleral icterus, pupils equal round and reactive, no palptable cervical adenopathy,  CV: RRR, no m/r/g, no JVD, no carotid bruits Resp: Clear to auscultation bilaterally GI: abdomen is soft, non-tender, non-distended, normal bowel sounds, no hepatosplenomegaly MSK: extremities are warm, no edema.  Skin: warm, no rash Neuro:  no focal deficits Psych: appropriate affect   Diagnostic Studies 04/2014 Echo Study Conclusions  - Left ventricle: Can not rule out apical clot. Severe hypokinesis mid/apical inferior segments, mid/apical anterior segments, mid anteroseptal segment. The cavity size was normal. Wall thickness was increased in a pattern of mild LVH. The estimated ejection fraction was 40%. Doppler parameters are consistent with abnormal left ventricular relaxation (grade 1 diastolic dysfunction). - Right ventricle: The cavity size was normal. Systolic function was normal.  04/2014 Cath  HEMODYNAMICS: Aortic pressure was 132/67; LV pressure was 136/19; LVEDP 34. There was no gradient between the left ventricle and aorta.   ANGIOGRAPHIC DATA: The left main coronary artery is widely patent.  The left anterior descending artery is a large vessel. The vessel is occluded in the midportion. After intervention, it was noted that there was disease just past the occlusion. The remainder of the mid to distal LAD was widely patent. There were several small diagonals which were patent.  The left  circumflex artery is a large vessel with mild irregularities proximally. There is a large first obtuse marginal which is widely patent. The second obtuse marginal is large and widely patent.  The right coronary artery is a large dominant vessel. In the mid vessel, there is an eccentric 40% stenosis. The posterior descending artery is large and widely patent. The posterior lateral artery is large and widely patent.  LEFT VENTRICULOGRAM: Left ventricular angiogram was not done due to elevated LVEDP. LVEDP was 34 mmHg.  PCI NARRATIVE: A CLS 3.0 guiding catheter was used to engage the left main. A pro-water wire was placed across the area disease in the LAD. A fetch thrombectomy catheter was used to perform aspiration thrombectomy. There was significant thrombus removal. A second pass was performed with even more thrombus removal. TIMI-3 flow was restored after the first pass. There was a residual mid vessel lesion which was about 12 mm in length, up to 80%. Just  past this, there was another segment of moderate disease. The more proximal area was ballooned with a 2.5 x 12 balloon. The entire area was stented with a 2.75 x 28 Promus drug-eluting stent, postdilated to greater than 3 mm in diameter. There was some sluggish flow in the very apical LAD. Multiple doses of IC adenosine were given. TIMI-3 flow was present at the end of the procedure. The patient was pain-free.  IMPRESSIONS:  1. Normal left main coronary artery. 2. Occluded mid left anterior descending artery which was the culprit for today's presentation. This was successfully treated with a 2.75 x 28 Promus drug-eluting stent, postdilated to greater than 3 mm in diameter. 3. Mild disease in the left circumflex artery and its branches. 4. Mild to moderate disease in the right coronary artery. 5. Left ventricular systolic function was not assessed. LVEDP 34 mmHg.  RECOMMENDATION: We'll watch her in the ICU. Start low-dose beta blocker.  Start high-dose statin. She needs aggressive secondary prevention. Of note, during the case, we checked about the safety of dual antiplatelet therapy with Dr. Ellene Route, as the patient has small brain aneurysms per her report. She was thought to be appropriate for prolonged dual antiplatelet therapy. She will need dual antiplatelet therapy for at least a year. Check echocardiogram to evaluate LV function. Assessment and Plan     A/P 1. CAD/ICM - recent anterior STEMI with DES to LAD, continue DAPT at least until 04/2015. LVEF 40%.  - no current chest pain symptoms - continue current meds and secondary prevention - soft bp's today that are asymptomatic, continue current meds, no further titration at this time.  - change her lasix to prn only  2. HTN - at goal, soft bp noted in clinic however home numbers more within range. No symptoms. Continue to follow, counseled if feels lightheadness or dizziness to contact us and we will decrease her cardiac meds  3. Tobacco - she has succesfully quit  F/u 2 months   Arnoldo Lenis, M.D.

## 2014-05-09 NOTE — Addendum Note (Signed)
Addended by: Laurine Blazer on: 05/09/2014 02:28 PM   Modules accepted: Orders

## 2014-05-09 NOTE — Patient Instructions (Signed)
   Change Lasix to 20mg  as needed for swelling or weight gain Continue all other medications.   Your physician has requested that you regularly monitor and record your blood pressure readings at home. Please take readings approximately 2 hours after medication 3-4 x per week & bring to next office visit (PMD). Follow up in  2 months

## 2014-05-20 ENCOUNTER — Encounter (HOSPITAL_COMMUNITY)
Admission: RE | Admit: 2014-05-20 | Discharge: 2014-05-20 | Disposition: A | Discharge status: Home / Self Care | Payer: Medicare Other | Source: Ambulatory Visit | Attending: Cardiology | Admitting: Cardiology

## 2014-05-20 ENCOUNTER — Encounter (HOSPITAL_COMMUNITY): Payer: Self-pay

## 2014-05-20 ENCOUNTER — Telehealth: Payer: Self-pay | Admitting: Cardiology

## 2014-05-20 VITALS — BP 118/64 | HR 73 | Ht 64.0 in

## 2014-05-20 DIAGNOSIS — I2102 ST elevation (STEMI) myocardial infarction involving left anterior descending coronary artery: Secondary | ICD-10-CM

## 2014-05-20 HISTORY — DX: Atherosclerotic heart disease of native coronary artery without angina pectoris: I25.10

## 2014-05-20 HISTORY — DX: Type 2 diabetes mellitus without complications: E11.9

## 2014-05-20 HISTORY — DX: Other cervical disc degeneration, unspecified cervical region: M50.30

## 2014-05-20 HISTORY — DX: Ulcerative colitis, unspecified, without complications: K51.90

## 2014-05-20 HISTORY — DX: Unspecified osteoarthritis, unspecified site: M19.90

## 2014-05-20 NOTE — Progress Notes (Signed)
Patient referred to Cardiac Rehab by Dr. Harl Bowie due to STEMI (I21.02.  Dr. Harl Bowie is her cardiologist and Dr. Manuella Ghazi is her PCP.  During orientation advised patient on arrival and appointment times what to wear, what to do before, during and after exercise.  Reviewed attendance and class policy.  Talked about inclement weather and class consultation policy. Patient is scheduled to start cardiac Rehab on May 26, 2014 at 11am. Patient was advised to come to class 5 minutes before class starts.  She was also given instructions on meeting with the dietician and attending the Family Structure classes. Pt is eager to get started.  Patient finished 6 minute walk test.

## 2014-05-20 NOTE — Patient Instructions (Signed)
Pt has finished orientation and is scheduled to start CR on May 26, 2014 at 1100. Pt has been instructed to arrive to class 15 minutes early for scheduled class. Pt has been instructed to wear comfortable clothing and shoes with rubber soles. Pt has been told to take their medications 1 hour prior to coming to class.  If the patient is not going to attend class, she has been instructed to call.

## 2014-05-20 NOTE — Telephone Encounter (Signed)
Casey Avila called stating that  She is very tired. She is wanting to know if the Lasix could be causing this. Wanted to know if she needs her Potassium checked.  She has also been having diarrhea.

## 2014-05-21 NOTE — Telephone Encounter (Signed)
Returned call to patient - stated that she did feel tired x 3 days, but feeling better now.  Did have diarrhea, but that has also gotten better.  Stated that she has only had to take her Lasix one time since last office visit.  Advised patient to continue to monitor for now, but message will be sent to provider for any further recommendations.  Patient verbalized understanding.

## 2014-05-25 ENCOUNTER — Other Ambulatory Visit: Payer: Self-pay | Admitting: Physician Assistant

## 2014-05-26 ENCOUNTER — Encounter (HOSPITAL_COMMUNITY)
Admission: RE | Admit: 2014-05-26 | Discharge: 2014-05-26 | Disposition: A | Discharge status: Home / Self Care | Payer: Medicare Other | Source: Ambulatory Visit | Attending: Cardiology | Admitting: Cardiology

## 2014-05-26 DIAGNOSIS — I2102 ST elevation (STEMI) myocardial infarction involving left anterior descending coronary artery: Secondary | ICD-10-CM | POA: Insufficient documentation

## 2014-05-26 NOTE — Telephone Encounter (Signed)
If she had fatigue and diarrhea she may have had a stomach virus. Since symptoms have resolved would not do any further testing at this time.   Zandra Abts MD

## 2014-05-26 NOTE — Telephone Encounter (Signed)
Patient notified

## 2014-05-28 ENCOUNTER — Encounter (HOSPITAL_COMMUNITY)
Admission: RE | Admit: 2014-05-28 | Discharge: 2014-05-28 | Disposition: A | Discharge status: Home / Self Care | Payer: Medicare Other | Source: Ambulatory Visit | Attending: Cardiology | Admitting: Cardiology

## 2014-05-28 DIAGNOSIS — I2102 ST elevation (STEMI) myocardial infarction involving left anterior descending coronary artery: Secondary | ICD-10-CM | POA: Diagnosis not present

## 2014-05-29 ENCOUNTER — Telehealth: Payer: Self-pay | Admitting: Cardiovascular Disease

## 2014-05-29 MED ORDER — ATORVASTATIN CALCIUM 80 MG PO TABS: 80.0000 mg | ORAL_TABLET | Freq: Every day | ORAL | Status: DC

## 2014-05-29 MED ORDER — FUROSEMIDE 20 MG PO TABS: 20.0000 mg | ORAL_TABLET | ORAL | Status: DC | PRN

## 2014-05-29 MED ORDER — CARVEDILOL 12.5 MG PO TABS: 12.5000 mg | ORAL_TABLET | Freq: Two times a day (BID) | ORAL | Status: DC

## 2014-05-29 MED ORDER — SPIRONOLACTONE 25 MG PO TABS: 12.5000 mg | ORAL_TABLET | Freq: Every day | ORAL | Status: DC

## 2014-05-29 MED ORDER — ENALAPRIL MALEATE 5 MG PO TABS: 5.0000 mg | ORAL_TABLET | Freq: Two times a day (BID) | ORAL | Status: DC

## 2014-05-29 NOTE — Telephone Encounter (Signed)
Patient notified.  Done.

## 2014-05-29 NOTE — Telephone Encounter (Signed)
PATIENT walked into office wanting to discuss having medication sent into her insurance company  Wants year [facility] [place] [facility] [id number] [place], [place] [address]  lipitor Coreg vasotec Lasix Aldactone  Also, she wants a call letting her know when they have been sent in so she cant stop getting from regular pharmacy

## 2014-05-30 ENCOUNTER — Encounter (HOSPITAL_COMMUNITY)
Admission: RE | Admit: 2014-05-30 | Discharge: 2014-05-30 | Disposition: A | Discharge status: Home / Self Care | Payer: Medicare Other | Source: Ambulatory Visit | Attending: Cardiology | Admitting: Cardiology

## 2014-05-30 DIAGNOSIS — I2102 ST elevation (STEMI) myocardial infarction involving left anterior descending coronary artery: Secondary | ICD-10-CM | POA: Diagnosis not present

## 2014-05-30 NOTE — Progress Notes (Signed)
Cardiac Rehabilitation Program Outcomes Report   Orientation:  05/20/14 Graduate Date:  tbd Discharge Date:  tbd # of sessions completed: 3  Cardiologist: Branch Family MD:  Jannifer Franklin Time:  1100  A.  Exercise Program:  Tolerates exercise @ 5.3 METS for 15 minutes and Walk Test Results:  Pre: 2.74 mets  B.  Mental Health:  Good mental attitude  C.  Education/Instruction/Skills  Accurately checks own pulse.  Rest:  84  Exercise:  114 and Knows THR for exercise  Uses Perceived Exertion Scale and/or Dyspnea Scale  D.  Nutrition/Weight Control/Body Composition:  Adherence to prescribed nutrition program: fair    E.  Blood Lipids    Lab Results  Component Value Date   CHOL 130 05/05/2014   HDL 36* 05/05/2014   LDLCALC 36 05/05/2014   TRIG 289* 05/05/2014   CHOLHDL 3.6 05/05/2014    F.  Lifestyle Changes:  Making positive lifestyle changes  G.  Symptoms noted with exercise:  Asymptomatic  Report Completed By:  Stevphen Rochester RN    Comments:  Patients first week note.

## 2014-06-02 ENCOUNTER — Encounter (HOSPITAL_COMMUNITY)
Admission: RE | Admit: 2014-06-02 | Discharge: 2014-06-02 | Disposition: A | Discharge status: Home / Self Care | Payer: Medicare Other | Source: Ambulatory Visit | Attending: Cardiology | Admitting: Cardiology

## 2014-06-02 DIAGNOSIS — I2102 ST elevation (STEMI) myocardial infarction involving left anterior descending coronary artery: Secondary | ICD-10-CM | POA: Diagnosis not present

## 2014-06-04 ENCOUNTER — Encounter (HOSPITAL_COMMUNITY)
Admission: RE | Admit: 2014-06-04 | Discharge: 2014-06-04 | Disposition: A | Discharge status: Home / Self Care | Payer: Medicare Other | Source: Ambulatory Visit | Attending: Cardiology | Admitting: Cardiology

## 2014-06-04 DIAGNOSIS — I2102 ST elevation (STEMI) myocardial infarction involving left anterior descending coronary artery: Secondary | ICD-10-CM | POA: Diagnosis not present

## 2014-06-06 ENCOUNTER — Encounter (HOSPITAL_COMMUNITY): Payer: Medicare Other

## 2014-06-09 ENCOUNTER — Encounter (HOSPITAL_COMMUNITY)
Admission: RE | Admit: 2014-06-09 | Discharge: 2014-06-09 | Disposition: A | Discharge status: Home / Self Care | Payer: Medicare Other | Source: Ambulatory Visit | Attending: Cardiology | Admitting: Cardiology

## 2014-06-09 DIAGNOSIS — I2102 ST elevation (STEMI) myocardial infarction involving left anterior descending coronary artery: Secondary | ICD-10-CM | POA: Diagnosis not present

## 2014-06-11 ENCOUNTER — Encounter (HOSPITAL_COMMUNITY)
Admission: RE | Admit: 2014-06-11 | Discharge: 2014-06-11 | Disposition: A | Discharge status: Home / Self Care | Payer: Medicare Other | Source: Ambulatory Visit | Attending: Cardiology | Admitting: Cardiology

## 2014-06-11 DIAGNOSIS — I2102 ST elevation (STEMI) myocardial infarction involving left anterior descending coronary artery: Secondary | ICD-10-CM | POA: Diagnosis not present

## 2014-06-13 ENCOUNTER — Encounter (HOSPITAL_COMMUNITY): Payer: Medicare Other

## 2014-06-16 ENCOUNTER — Emergency Department (HOSPITAL_COMMUNITY)
Admission: EM | Admit: 2014-06-16 | Discharge: 2014-06-16 | Disposition: A | Discharge status: Home / Self Care | Payer: Medicare Other | Attending: Emergency Medicine | Admitting: Emergency Medicine

## 2014-06-16 ENCOUNTER — Emergency Department (HOSPITAL_COMMUNITY): Payer: Medicare Other

## 2014-06-16 ENCOUNTER — Telehealth: Payer: Self-pay

## 2014-06-16 ENCOUNTER — Other Ambulatory Visit: Payer: Self-pay

## 2014-06-16 ENCOUNTER — Encounter (HOSPITAL_COMMUNITY)
Admission: RE | Admit: 2014-06-16 | Discharge: 2014-06-16 | Disposition: A | Discharge status: Home / Self Care | Payer: Medicare Other | Source: Ambulatory Visit | Attending: Cardiology | Admitting: Cardiology

## 2014-06-16 ENCOUNTER — Encounter (HOSPITAL_COMMUNITY): Payer: Self-pay

## 2014-06-16 DIAGNOSIS — M1990 Unspecified osteoarthritis, unspecified site: Secondary | ICD-10-CM | POA: Insufficient documentation

## 2014-06-16 DIAGNOSIS — I2510 Atherosclerotic heart disease of native coronary artery without angina pectoris: Secondary | ICD-10-CM | POA: Insufficient documentation

## 2014-06-16 DIAGNOSIS — E118 Type 2 diabetes mellitus with unspecified complications: Secondary | ICD-10-CM | POA: Insufficient documentation

## 2014-06-16 DIAGNOSIS — R079 Chest pain, unspecified: Secondary | ICD-10-CM

## 2014-06-16 DIAGNOSIS — I10 Essential (primary) hypertension: Secondary | ICD-10-CM | POA: Diagnosis not present

## 2014-06-16 DIAGNOSIS — I252 Old myocardial infarction: Secondary | ICD-10-CM | POA: Diagnosis not present

## 2014-06-16 DIAGNOSIS — Z8719 Personal history of other diseases of the digestive system: Secondary | ICD-10-CM | POA: Diagnosis not present

## 2014-06-16 DIAGNOSIS — Z79899 Other long term (current) drug therapy: Secondary | ICD-10-CM | POA: Diagnosis not present

## 2014-06-16 DIAGNOSIS — Z7982 Long term (current) use of aspirin: Secondary | ICD-10-CM | POA: Insufficient documentation

## 2014-06-16 DIAGNOSIS — R0789 Other chest pain: Secondary | ICD-10-CM | POA: Insufficient documentation

## 2014-06-16 DIAGNOSIS — Z87891 Personal history of nicotine dependence: Secondary | ICD-10-CM | POA: Insufficient documentation

## 2014-06-16 DIAGNOSIS — I2102 ST elevation (STEMI) myocardial infarction involving left anterior descending coronary artery: Secondary | ICD-10-CM | POA: Diagnosis not present

## 2014-06-16 LAB — CBC WITH DIFFERENTIAL/PLATELET
Basophils Absolute: 0.1 10*3/uL (ref 0.0–0.1)
Basophils Relative: 1 % (ref 0–1)
EOS ABS: 0.2 10*3/uL (ref 0.0–0.7)
Eosinophils Relative: 2 % (ref 0–5)
HCT: 36.3 % (ref 36.0–46.0)
Hemoglobin: 12.5 g/dL (ref 12.0–15.0)
LYMPHS ABS: 3.5 10*3/uL (ref 0.7–4.0)
LYMPHS PCT: 38 % (ref 12–46)
MCH: 30.7 pg (ref 26.0–34.0)
MCHC: 34.4 g/dL (ref 30.0–36.0)
MCV: 89.2 fL (ref 78.0–100.0)
MONO ABS: 0.8 10*3/uL (ref 0.1–1.0)
Monocytes Relative: 9 % (ref 3–12)
NEUTROS PCT: 50 % (ref 43–77)
Neutro Abs: 4.6 10*3/uL (ref 1.7–7.7)
PLATELETS: 258 10*3/uL (ref 150–400)
RBC: 4.07 MIL/uL (ref 3.87–5.11)
RDW: 12.8 % (ref 11.5–15.5)
WBC: 9.1 10*3/uL (ref 4.0–10.5)

## 2014-06-16 LAB — TROPONIN I: Troponin I: <0.03 ng/mL (ref <0.031)

## 2014-06-16 LAB — BASIC METABOLIC PANEL
Anion gap: 6 (ref 5–15)
BUN: 13 mg/dL (ref 6–23)
CALCIUM: 9.1 mg/dL (ref 8.4–10.5)
CO2: 24 mmol/L (ref 19–32)
CREATININE: 0.84 mg/dL (ref 0.50–1.10)
Chloride: 109 mmol/L (ref 96–112)
GFR, EST AFRICAN AMERICAN: 80 mL/min — AB (ref >90)
GFR, EST NON AFRICAN AMERICAN: 69 mL/min — AB (ref >90)
GLUCOSE: 98 mg/dL (ref 70–99)
POTASSIUM: 4.1 mmol/L (ref 3.5–5.1)
Sodium: 139 mmol/L (ref 135–145)

## 2014-06-16 NOTE — Progress Notes (Signed)
Pt was attending Cardiac Rehab today when at approx 1130, pt started c/o dizziness, sob, and chest heaviness, rating at 2/10 while walking on treadmill.  Pt had mentioned earlier that she had chest heaviness radiating down arm and into jaw over the weekend with sob on exertion but did not get checked, "I thought it was my neck".  Pt has hx of chronic neck pain.  After pt's report of chest heaviness, pt was immediately stopped on treadmill and sat down.  Color WNL, non-diaphoretic.  NSR on monitor with HR 97, BP 102/60.  Pt reports symptoms were better with sitting but still present.  RN called [facility] office for further instructions.  While awaiting return call, pt became pale, diaphoretic, still experiencing mild chest heaviness, lightheadedness, and sob.  Pt was then taken to ED by RN.  Prior to pt leaving CR going to ED, [facility] called back, directing pt to go to ED.  Pt transferred to ED via wheelchair, escorted by husband, report given to Sol Passer.

## 2014-06-16 NOTE — ED Notes (Signed)
Pt brought to ED from cardiac rehab for evaluation of chest pain. Per nurse pt was exercising when she began having chest pain, diaphoretic, fatigue and neck pain. Pt has a history of neck pain and is to have surgery when medically cleared

## 2014-06-16 NOTE — Discharge Instructions (Signed)

## 2014-06-16 NOTE — Telephone Encounter (Signed)
Received call from [place] at cardiac rehab.Pt with history of STEMI 12/15 with DES developed CP with exercise,SOB on exertion,became lightheaded and is now diaphoretic.BP 102/sys. Patient to be taken to be taken to ED immediately. DOD, Dr.koneswaran made aware

## 2014-06-16 NOTE — ED Provider Notes (Signed)
CSN: [id number]     Arrival date & time 06/16/14  1157 History  This chart was scribed for Richarda Blade, MD by Edison Simon, ED Scribe. This patient was seen in room APA09/APA09 and the patient's care was started at 12:59 PM.    Chief Complaint  Patient presents with  . Chest Pain   The history is provided by the patient. No language interpreter was used.    HPI Comments: Casey Avila is a 69 y.o. female who presents to the Emergency Department complaining of chest pain described as like weight on her chest with onset while using treadmill earlier today shortly after beginning to use it. She states that her pain now is similar to pain before originating from back. She reports associated dizziness and diaphoresis. She states pain has improved by this time and rates it at 2/10; she states it was 3/10 at worst. She notes that she has had chest heaviness and SOB with exertion for the past approximately 5-6 days. She states pain resolves with rest and she has not used any medication it. She notes that she just had cardiac stents placed 05/02/14 after she had sharp chest pain radiating to her shoulder blade with diagnosis of MI. She was hospitalized for 4 days. She uses baby ASA and Brilinta every day. She states she has been doing therapy 3 times a week status post MI and stent and last used treadmill 5 days ago. She denies similar symptoms while using treadmill before. She notes that during prior MI, she thought that her symptoms were from back problems. She uses Tylenol and Vicodin for back pain; she states she uses Vicodin a few times a month. She denies SOB today.  Cardiologist: Dr. Harl Bowie  Past Medical History  Diagnosis Date  . Aneurysm     brain aneurysms x 2 x several yrs  . ST elevation (STEMI) myocardial infarction involving left anterior descending coronary artery 05/02/2014  . Ischemic cardiomyopathy 05/02/2014  . HTN (hypertension)   . Coronary artery disease   . Diabetes mellitus  without complication   . DDD (degenerative disc disease), cervical   . Osteoarthritis   . Ulcerative colitis    Past Surgical History  Procedure Laterality Date  . Neck surgery    . Left heart catheterization with coronary angiogram N/A 05/02/2014    Procedure: LEFT HEART CATHETERIZATION WITH CORONARY ANGIOGRAM;  Surgeon: Jettie Booze, MD;  Location: [facility] CATH LAB;  Service: Cardiovascular;  Laterality: N/A;  . Cardiac catheterization    . Ptca    . Abdominal hysterectomy    . Bladder removal     No family history on file. History  Substance Use Topics  . Smoking status: Former Smoker -- 1.50 packs/day for 48 years    Types: Cigarettes    Start date: 05/09/1966    Quit date: 05/01/2014  . Smokeless tobacco: Never Used  . Alcohol Use: No   OB History    No data available     Review of Systems  Respiratory: Negative for shortness of breath.   Cardiovascular: Positive for chest pain.  Neurological: Positive for dizziness.  All other systems reviewed and are negative.    Allergies  Clindamycin/lincomycin; Bactrim; Lipitor; and Morphine and related  Home Medications   Prior to Admission medications   Medication Sig Start Date End Date Taking? Authorizing Provider  ALPRAZolam Duanne Moron) 0.5 MG tablet Take 0.5 mg by mouth 2 (two) times daily as needed for sleep.   Yes Historical  Provider, MD  aspirin 81 MG chewable tablet Chew 1 tablet (81 mg total) by mouth daily. 05/05/14  Yes Rhonda G Barrett, PA-C  atorvastatin (LIPITOR) 80 MG tablet Take 1 tablet (80 mg total) by mouth daily. 05/29/14  Yes Arnoldo Lenis, MD  BRILINTA 90 MG TABS tablet TAKE 1 TABLET BY MOUTH TWICE A DAY 05/26/14  Yes Arnoldo Lenis, MD  carvedilol (COREG) 12.5 MG tablet Take 1 tablet (12.5 mg total) by mouth 2 (two) times daily. 05/29/14  Yes Arnoldo Lenis, MD  enalapril (VASOTEC) 5 MG tablet Take 1 tablet (5 mg total) by mouth 2 (two) times daily. 05/29/14  Yes Arnoldo Lenis, MD  furosemide  (LASIX) 20 MG tablet Take 1 tablet (20 mg total) by mouth as needed for edema (swelling or weight gain). 05/29/14  Yes Arnoldo Lenis, MD  LACTOBACILLUS PO Take 2 capsules by mouth daily.   Yes Historical Provider, MD  omeprazole (PRILOSEC) 20 MG capsule Take 20 mg by mouth 2 (two) times daily before a meal.   Yes Historical Provider, MD  spironolactone (ALDACTONE) 25 MG tablet Take 0.5 tablets (12.5 mg total) by mouth daily. 05/29/14  Yes Arnoldo Lenis, MD  topiramate (TOPAMAX) 100 MG tablet Take 100 mg by mouth daily.   Yes Historical Provider, MD  venlafaxine (EFFEXOR) 37.5 MG tablet Take 37.5 mg by mouth daily.    Yes Historical Provider, MD   BP 133/74 mmHg  Pulse 71  Temp(Src) 97.8 F (36.6 C) (Oral)  Resp 21  Ht 5\' 4"  (1.626 m)  Wt 175 lb (79.379 kg)  BMI 30.02 kg/m2  SpO2 96% Physical Exam  Constitutional: She is oriented to person, place, and time. She appears well-developed and well-nourished.  HENT:  Head: Normocephalic and atraumatic.  Eyes: Conjunctivae and EOM are normal. Pupils are equal, round, and reactive to light.  Neck: Normal range of motion and phonation normal. Neck supple.  Cardiovascular: Normal rate and regular rhythm.   Pulmonary/Chest: Effort normal and breath sounds normal. She exhibits no tenderness.  Abdominal: Soft. She exhibits no distension. There is no tenderness. There is no guarding.  Musculoskeletal: Normal range of motion.  Neurological: She is alert and oriented to person, place, and time. She exhibits normal muscle tone.  Skin: Skin is warm and dry.  Psychiatric: She has a normal mood and affect. Her behavior is normal. Judgment and thought content normal.  Nursing note and vitals reviewed.   ED Course  Procedures (including critical care time)  DIAGNOSTIC STUDIES: Oxygen Saturation is 98% on room air, normal by my interpretation.    COORDINATION OF CARE: 1:07 PM Discussed treatment plan with patient at beside, the patient agrees with  the plan and has no further questions at this time. Medications - No data to display  Patient Vitals for the past 24 hrs:  BP Temp Temp src Pulse Resp SpO2 Height Weight  06/16/14 1400 133/74 mmHg - - 71 21 96 % - -  06/16/14 1330 124/80 mmHg - - 71 20 96 % - -  06/16/14 1300 115/75 mmHg - - 71 12 97 % - -  06/16/14 1230 108/67 mmHg - - 75 16 97 % - -  06/16/14 1202 - - - - - - 5\' 4"  (1.626 m) 175 lb (79.379 kg)  06/16/14 1159 116/75 mmHg 97.8 F (36.6 C) Oral 75 18 98 % - -   13:30- .  I discussed the case with Dr. Jetty Duhamel, on-call for her cardiologist;  who agreed that she could be discharged with her current treatment plan.  1:47 PM Reevaluation with update and discussion. After initial assessment and treatment, an updated evaluation reveals patient remains comfortable.  Patient and husband updated on findings and treatment plan.  All questions answered. Moustafa Mossa L   Labs Review Labs Reviewed  BASIC METABOLIC PANEL - Abnormal; Notable for the following:    GFR calc non Af Amer 69 (*)    GFR calc Af Amer 80 (*)    All other components within normal limits  CBC WITH DIFFERENTIAL/PLATELET  TROPONIN I    Imaging Review Dg Chest Portable 1 View  06/16/2014   CLINICAL DATA:  Chest pain.  Recent myocardial infarction.  EXAM: PORTABLE CHEST - 1 VIEW  COMPARISON:  05/02/2014  FINDINGS: Heart size and pulmonary vascularity are normal. Calcification in the thoracic aorta. Tiny area of linear atelectasis at the left base laterally. Lungs are otherwise clear. No effusions. No acute osseous abnormality. Previous anterior cervical fusions.  IMPRESSION: No significant abnormality.   Electronically Signed   By: Rozetta Nunnery M.D.   On: 06/16/2014 12:55     EKG Interpretation None      MDM   Final diagnoses:  Nonspecific chest pain    Chest pain, likely radiating from back.  Chronic back pain, with anticipated surgery after she recovers from her cardiac problem and myocardial  infarction.  She is stable for discharge, as well as return to cardiac rehabilitation.  Nursing Notes Reviewed/ Care Coordinated Applicable Imaging Reviewed Interpretation of Laboratory Data incorporated into ED treatment  The patient appears reasonably screened and/or stabilized for discharge and I doubt any other medical condition or other [facility] requiring further screening, evaluation, or treatment in the ED at this time prior to discharge.  Plan: Home Medications- usual; Home Treatments- rest; return here if the recommended treatment, does not improve the symptoms; Recommended follow up- PCP and Cardiology prn   I personally performed the services described in this documentation, which was scribed in my presence. The recorded information has been reviewed and is accurate.     Richarda Blade, MD 06/16/14 [phone]

## 2014-06-18 ENCOUNTER — Encounter (HOSPITAL_COMMUNITY)
Admission: RE | Admit: 2014-06-18 | Discharge: 2014-06-18 | Disposition: A | Discharge status: Home / Self Care | Payer: Medicare Other | Source: Ambulatory Visit | Attending: Cardiology | Admitting: Cardiology

## 2014-06-18 DIAGNOSIS — I2102 ST elevation (STEMI) myocardial infarction involving left anterior descending coronary artery: Secondary | ICD-10-CM | POA: Diagnosis not present

## 2014-06-20 ENCOUNTER — Encounter (HOSPITAL_COMMUNITY)
Admission: RE | Admit: 2014-06-20 | Discharge: 2014-06-20 | Disposition: A | Discharge status: Home / Self Care | Payer: Medicare Other | Source: Ambulatory Visit | Attending: Cardiology | Admitting: Cardiology

## 2014-06-20 DIAGNOSIS — I2102 ST elevation (STEMI) myocardial infarction involving left anterior descending coronary artery: Secondary | ICD-10-CM | POA: Diagnosis not present

## 2014-06-23 ENCOUNTER — Encounter (HOSPITAL_COMMUNITY)
Admission: RE | Admit: 2014-06-23 | Discharge: 2014-06-23 | Disposition: A | Discharge status: Home / Self Care | Payer: Medicare Other | Source: Ambulatory Visit | Attending: Cardiology | Admitting: Cardiology

## 2014-06-23 DIAGNOSIS — I2102 ST elevation (STEMI) myocardial infarction involving left anterior descending coronary artery: Secondary | ICD-10-CM | POA: Diagnosis present

## 2014-06-25 ENCOUNTER — Encounter (HOSPITAL_COMMUNITY)
Admission: RE | Admit: 2014-06-25 | Discharge: 2014-06-25 | Disposition: A | Discharge status: Home / Self Care | Payer: Medicare Other | Source: Ambulatory Visit | Attending: Cardiology | Admitting: Cardiology

## 2014-06-25 DIAGNOSIS — I2102 ST elevation (STEMI) myocardial infarction involving left anterior descending coronary artery: Secondary | ICD-10-CM | POA: Diagnosis not present

## 2014-06-27 ENCOUNTER — Encounter (HOSPITAL_COMMUNITY)
Admission: RE | Admit: 2014-06-27 | Discharge: 2014-06-27 | Disposition: A | Discharge status: Home / Self Care | Payer: Medicare Other | Source: Ambulatory Visit | Attending: Cardiology | Admitting: Cardiology

## 2014-06-27 DIAGNOSIS — I2102 ST elevation (STEMI) myocardial infarction involving left anterior descending coronary artery: Secondary | ICD-10-CM | POA: Diagnosis not present

## 2014-06-30 ENCOUNTER — Encounter (HOSPITAL_COMMUNITY)
Admission: RE | Admit: 2014-06-30 | Discharge: 2014-06-30 | Disposition: A | Discharge status: Home / Self Care | Payer: Medicare Other | Source: Ambulatory Visit | Attending: Cardiology | Admitting: Cardiology

## 2014-06-30 DIAGNOSIS — I2102 ST elevation (STEMI) myocardial infarction involving left anterior descending coronary artery: Secondary | ICD-10-CM | POA: Diagnosis not present

## 2014-07-02 ENCOUNTER — Encounter (HOSPITAL_COMMUNITY): Payer: Medicare Other

## 2014-07-04 ENCOUNTER — Encounter: Payer: Self-pay | Admitting: Cardiology

## 2014-07-04 ENCOUNTER — Encounter (HOSPITAL_COMMUNITY): Payer: Medicare Other

## 2014-07-04 ENCOUNTER — Ambulatory Visit (INDEPENDENT_AMBULATORY_CARE_PROVIDER_SITE_OTHER): Payer: Medicare Other | Admitting: Cardiology

## 2014-07-04 VITALS — BP 108/68 | HR 81 | Ht 64.0 in | Wt 181.0 lb

## 2014-07-04 DIAGNOSIS — R079 Chest pain, unspecified: Secondary | ICD-10-CM

## 2014-07-04 DIAGNOSIS — I5022 Chronic systolic (congestive) heart failure: Secondary | ICD-10-CM

## 2014-07-04 DIAGNOSIS — I10 Essential (primary) hypertension: Secondary | ICD-10-CM

## 2014-07-04 NOTE — Patient Instructions (Addendum)
Your physician has requested that you have an echocardiogram. Echocardiography is a painless test that uses sound waves to create images of your heart. It provides your doctor with information about the size and shape of your heart and how well your heart's chambers and valves are working. This procedure takes approximately one hour. There are no restrictions for this procedure. - DUE IN 1 MONTH  Office will contact with results via phone or letter.   Continue all current medications. Follow up in  3 months

## 2014-07-04 NOTE — Progress Notes (Signed)
Clinical Summary Ms. Nghiem is a 69 y.o.female seen today for follow up of the following medical problems.  1. CAD/ICM - admitted with anterior STEMI 05/02/14, s/p DES to LAD. LVEF 40% by echo.   - seen in ER Jun 16, 2014 with chest pain. From ER notes described as atypical, radiating from the back. Discharged from ER.  Trop neg, EKG without ischemia, CXR without acute findings.   - continues to have atypical chest pain at times. Sharp tingling pain midchest. 2/10 in severity. Lasts just for just a few seconds. Typically occurs at rest. No other associated symptoms. Not positional. Occurs a few times a week. Chronic neck pain, can have numbess radiating down arm - occas LE edema. Limiting sodium intake. Stable weights at home 175 lbs.   2. HTN - bps 100s/70s at home - compliant with meds      Past Medical History  Diagnosis Date  . Aneurysm     brain aneurysms x 2 x several yrs  . ST elevation (STEMI) myocardial infarction involving left anterior descending coronary artery 05/02/2014  . Ischemic cardiomyopathy 05/02/2014  . HTN (hypertension)   . Coronary artery disease   . Diabetes mellitus without complication   . DDD (degenerative disc disease), cervical   . Osteoarthritis   . Ulcerative colitis      Allergies  Allergen Reactions  . Clindamycin/Lincomycin Itching and Rash    Had to go into ER   . Bactrim [Sulfamethoxazole-Trimethoprim] Rash  . Lipitor [Atorvastatin] Other (See Comments)    Aching but still takes per order from Cardiologist   . Morphine And Related     Makes me sick      Current Outpatient Prescriptions  Medication Sig Dispense Refill  . ALPRAZolam (XANAX) 0.5 MG tablet Take 0.5 mg by mouth 2 (two) times daily as needed for sleep.    Marland Kitchen aspirin 81 MG chewable tablet Chew 1 tablet (81 mg total) by mouth daily.    Marland Kitchen atorvastatin (LIPITOR) 80 MG tablet Take 1 tablet (80 mg total) by mouth daily. 90 tablet 3  . BRILINTA 90 MG TABS tablet TAKE  1 TABLET BY MOUTH TWICE A DAY 60 tablet 6  . carvedilol (COREG) 12.5 MG tablet Take 1 tablet (12.5 mg total) by mouth 2 (two) times daily. 180 tablet 3  . enalapril (VASOTEC) 5 MG tablet Take 1 tablet (5 mg total) by mouth 2 (two) times daily. 180 tablet 3  . furosemide (LASIX) 20 MG tablet Take 1 tablet (20 mg total) by mouth as needed for edema (swelling or weight gain). 90 tablet 0  . LACTOBACILLUS PO Take 2 capsules by mouth daily.    Marland Kitchen omeprazole (PRILOSEC) 20 MG capsule Take 20 mg by mouth 2 (two) times daily before a meal.    . spironolactone (ALDACTONE) 25 MG tablet Take 0.5 tablets (12.5 mg total) by mouth daily. 45 tablet 3  . topiramate (TOPAMAX) 100 MG tablet Take 100 mg by mouth daily.    Marland Kitchen venlafaxine (EFFEXOR) 37.5 MG tablet Take 37.5 mg by mouth daily.      No current facility-administered medications for this visit.     Past Surgical History  Procedure Laterality Date  . Neck surgery    . Left heart catheterization with coronary angiogram N/A 05/02/2014    Procedure: LEFT HEART CATHETERIZATION WITH CORONARY ANGIOGRAM;  Surgeon: Jettie Booze, MD;  Location: [facility] CATH LAB;  Service: Cardiovascular;  Laterality: N/A;  . Cardiac catheterization    .  Ptca    . Abdominal hysterectomy    . Bladder removal       Allergies  Allergen Reactions  . Clindamycin/Lincomycin Itching and Rash    Had to go into ER   . Bactrim [Sulfamethoxazole-Trimethoprim] Rash  . Lipitor [Atorvastatin] Other (See Comments)    Aching but still takes per order from Cardiologist   . Morphine And Related     Makes me sick       No family history on file.   Social History Ms. Vandehei reports that she quit smoking about 2 months ago. Her smoking use included Cigarettes. She started smoking about 48 years ago. She has a 72 pack-year smoking history. She has never used smokeless tobacco. Ms. Iacovelli reports that she does not drink alcohol.   Review of Systems CONSTITUTIONAL: No weight  loss, fever, chills, weakness or fatigue.  HEENT: Eyes: No visual loss, blurred vision, double vision or yellow sclerae.No hearing loss, sneezing, congestion, runny nose or sore throat.  SKIN: No rash or itching.  CARDIOVASCULAR: per HPI RESPIRATORY: No shortness of breath, cough or sputum.  GASTROINTESTINAL: No anorexia, nausea, vomiting or diarrhea. No abdominal pain or blood.  GENITOURINARY: No burning on urination, no polyuria NEUROLOGICAL: No headache, dizziness, syncope, paralysis, ataxia, numbness or tingling in the extremities. No change in bowel or bladder control.  MUSCULOSKELETAL: neck pain LYMPHATICS: No enlarged nodes. No history of splenectomy.  PSYCHIATRIC: No history of depression or anxiety.  ENDOCRINOLOGIC: No reports of sweating, cold or heat intolerance. No polyuria or polydipsia.  Marland Kitchen   Physical Examination p 81 bp 108/68 Wt 181 lbs BMI 31 Gen: resting comfortably, no acute distress HEENT: no scleral icterus, pupils equal round and reactive, no palptable cervical adenopathy,  CV: RRR,  No m/r/g, no jVD, no carotid bruits Resp: Clear to auscultation bilaterally GI: abdomen is soft, non-tender, non-distended, normal bowel sounds, no hepatosplenomegaly MSK: extremities are warm, no edema.  Skin: warm, no rash Neuro:  no focal deficits Psych: appropriate affect   Diagnostic Studies 04/2014 Echo Study Conclusions  - Left ventricle: Can not rule out apical clot. Severe hypokinesis mid/apical inferior segments, mid/apical anterior segments, mid anteroseptal segment. The cavity size was normal. Wall thickness was increased in a pattern of mild LVH. The estimated ejection fraction was 40%. Doppler parameters are consistent with abnormal left ventricular relaxation (grade 1 diastolic dysfunction). - Right ventricle: The cavity size was normal. Systolic function was normal.  04/2014 Cath  HEMODYNAMICS: Aortic pressure was 132/67; LV pressure was 136/19;  LVEDP 34. There was no gradient between the left ventricle and aorta.   ANGIOGRAPHIC DATA: The left main coronary artery is widely patent.  The left anterior descending artery is a large vessel. The vessel is occluded in the midportion. After intervention, it was noted that there was disease just past the occlusion. The remainder of the mid to distal LAD was widely patent. There were several small diagonals which were patent.  The left circumflex artery is a large vessel with mild irregularities proximally. There is a large first obtuse marginal which is widely patent. The second obtuse marginal is large and widely patent.  The right coronary artery is a large dominant vessel. In the mid vessel, there is an eccentric 40% stenosis. The posterior descending artery is large and widely patent. The posterior lateral artery is large and widely patent.  LEFT VENTRICULOGRAM: Left ventricular angiogram was not done due to elevated LVEDP. LVEDP was 34 mmHg.  PCI NARRATIVE: A CLS 3.0  guiding catheter was used to engage the left main. A pro-water wire was placed across the area disease in the LAD. A fetch thrombectomy catheter was used to perform aspiration thrombectomy. There was significant thrombus removal. A second pass was performed with even more thrombus removal. TIMI-3 flow was restored after the first pass. There was a residual mid vessel lesion which was about 12 mm in length, up to 80%. Just past this, there was another segment of moderate disease. The more proximal area was ballooned with a 2.5 x 12 balloon. The entire area was stented with a 2.75 x 28 Promus drug-eluting stent, postdilated to greater than 3 mm in diameter. There was some sluggish flow in the very apical LAD. Multiple doses of IC adenosine were given. TIMI-3 flow was present at the end of the procedure. The patient was pain-free.  IMPRESSIONS:  1. Normal left main coronary artery. 2. Occluded mid left anterior descending  artery which was the culprit for today's presentation. This was successfully treated with a 2.75 x 28 Promus drug-eluting stent, postdilated to greater than 3 mm in diameter. 3. Mild disease in the left circumflex artery and its branches. 4. Mild to moderate disease in the right coronary artery. 5. Left ventricular systolic function was not assessed. LVEDP 34 mmHg.  RECOMMENDATION: We'll watch her in the ICU. Start low-dose beta blocker. Start high-dose statin. She needs aggressive secondary prevention. Of note, during the case, we checked about the safety of dual antiplatelet therapy with Dr. Ellene Route, as the patient has small brain aneurysms per her report. She was thought to be appropriate for prolonged dual antiplatelet therapy. She will need dual antiplatelet therapy for at least a year. Check echocardiogram to evaluate LV function.      Assessment and Plan  1. CAD/ICM - hx of anterior STEMI with DES to LAD, continue DAPT at least until 04/2015. LVEF 40%.  - atypical chest pain symptoms likely related to chronic neck issues - continue current meds and secondary prevention - repeat echo to reeval LVEF s/p her MI  2. HTN - at goal, continue current meds    F/u 1 month   Arnoldo Lenis, M.D.,

## 2014-07-07 ENCOUNTER — Encounter (HOSPITAL_COMMUNITY)
Admission: RE | Admit: 2014-07-07 | Discharge: 2014-07-07 | Disposition: A | Discharge status: Home / Self Care | Payer: Medicare Other | Source: Ambulatory Visit | Attending: Cardiology | Admitting: Cardiology

## 2014-07-07 DIAGNOSIS — I2102 ST elevation (STEMI) myocardial infarction involving left anterior descending coronary artery: Secondary | ICD-10-CM | POA: Diagnosis not present

## 2014-07-09 ENCOUNTER — Encounter (HOSPITAL_COMMUNITY)
Admission: RE | Admit: 2014-07-09 | Discharge: 2014-07-09 | Disposition: A | Discharge status: Home / Self Care | Payer: Medicare Other | Source: Ambulatory Visit | Attending: Cardiology | Admitting: Cardiology

## 2014-07-09 DIAGNOSIS — I2102 ST elevation (STEMI) myocardial infarction involving left anterior descending coronary artery: Secondary | ICD-10-CM | POA: Diagnosis not present

## 2014-07-11 ENCOUNTER — Encounter (HOSPITAL_COMMUNITY)
Admission: RE | Admit: 2014-07-11 | Discharge: 2014-07-11 | Disposition: A | Discharge status: Home / Self Care | Payer: Medicare Other | Source: Ambulatory Visit | Attending: Cardiology | Admitting: Cardiology

## 2014-07-11 DIAGNOSIS — I2102 ST elevation (STEMI) myocardial infarction involving left anterior descending coronary artery: Secondary | ICD-10-CM | POA: Diagnosis not present

## 2014-07-14 ENCOUNTER — Encounter (HOSPITAL_COMMUNITY)
Admission: RE | Admit: 2014-07-14 | Discharge: 2014-07-14 | Disposition: A | Discharge status: Home / Self Care | Payer: Medicare Other | Source: Ambulatory Visit | Attending: Cardiology | Admitting: Cardiology

## 2014-07-14 DIAGNOSIS — I2102 ST elevation (STEMI) myocardial infarction involving left anterior descending coronary artery: Secondary | ICD-10-CM | POA: Diagnosis not present

## 2014-07-15 NOTE — Progress Notes (Signed)
Cardiac Rehabilitation Program Outcomes Report   Orientation:  05/20/14 Graduate Date:  tbd Discharge Date:  tbd # of sessions completed: 18  Cardiologist: Jefm Bryant MD:  Jannifer Franklin Time:  1100  A.  Exercise Program:  Tolerates exercise @ 2.84 METS for 15 minutes  B.  Mental Health:  Good mental attitude  C.  Education/Instruction/Skills  Accurately checks own pulse.  Rest:  89  Exercise:  117 and Knows THR for exercise  Uses Perceived Exertion Scale and/or Dyspnea Scale  D.  Nutrition/Weight Control/Body Composition:  Adherence to prescribed nutrition program: good    E.  Blood Lipids    Lab Results  Component Value Date   CHOL 130 05/05/2014   HDL 36* 05/05/2014   LDLCALC 36 05/05/2014   TRIG 289* 05/05/2014   CHOLHDL 3.6 05/05/2014    F.  Lifestyle Changes:  Making positive lifestyle changes  G.  Symptoms noted with exercise:  Angina exertional and Dizziness. Session 8. See Epic note. Pt sent to ED.   Report Completed By:  Stevphen Rochester RN   Comments:  This is patients halfway noted. Pt using TM twice for last 4 visits as advised to pt by Dr. Harl Bowie per pt due to increasing chronic pain to back.

## 2014-07-16 ENCOUNTER — Encounter (HOSPITAL_COMMUNITY)
Admission: RE | Admit: 2014-07-16 | Discharge: 2014-07-16 | Disposition: A | Discharge status: Home / Self Care | Payer: Medicare Other | Source: Ambulatory Visit | Attending: Cardiology | Admitting: Cardiology

## 2014-07-16 DIAGNOSIS — I2102 ST elevation (STEMI) myocardial infarction involving left anterior descending coronary artery: Secondary | ICD-10-CM | POA: Diagnosis not present

## 2014-07-18 ENCOUNTER — Encounter (HOSPITAL_COMMUNITY): Payer: Medicare Other

## 2014-07-21 ENCOUNTER — Encounter (HOSPITAL_COMMUNITY)
Admission: RE | Admit: 2014-07-21 | Discharge: 2014-07-21 | Disposition: A | Discharge status: Home / Self Care | Payer: Medicare Other | Source: Ambulatory Visit | Attending: Cardiology | Admitting: Cardiology

## 2014-07-21 DIAGNOSIS — I2102 ST elevation (STEMI) myocardial infarction involving left anterior descending coronary artery: Secondary | ICD-10-CM | POA: Diagnosis not present

## 2014-07-23 ENCOUNTER — Encounter (HOSPITAL_COMMUNITY)
Admission: RE | Admit: 2014-07-23 | Discharge: 2014-07-23 | Disposition: A | Discharge status: Home / Self Care | Payer: Medicare Other | Source: Ambulatory Visit | Attending: Cardiology | Admitting: Cardiology

## 2014-07-23 DIAGNOSIS — I2102 ST elevation (STEMI) myocardial infarction involving left anterior descending coronary artery: Secondary | ICD-10-CM | POA: Insufficient documentation

## 2014-07-25 ENCOUNTER — Encounter (HOSPITAL_COMMUNITY)
Admission: RE | Admit: 2014-07-25 | Discharge: 2014-07-25 | Disposition: A | Discharge status: Home / Self Care | Payer: Medicare Other | Source: Ambulatory Visit | Attending: Cardiology | Admitting: Cardiology

## 2014-07-25 DIAGNOSIS — I2102 ST elevation (STEMI) myocardial infarction involving left anterior descending coronary artery: Secondary | ICD-10-CM | POA: Diagnosis not present

## 2014-07-28 ENCOUNTER — Encounter (HOSPITAL_COMMUNITY)
Admission: RE | Admit: 2014-07-28 | Discharge: 2014-07-28 | Disposition: A | Discharge status: Home / Self Care | Payer: Medicare Other | Source: Ambulatory Visit | Attending: Cardiology | Admitting: Cardiology

## 2014-07-28 DIAGNOSIS — I2102 ST elevation (STEMI) myocardial infarction involving left anterior descending coronary artery: Secondary | ICD-10-CM | POA: Diagnosis not present

## 2014-07-30 ENCOUNTER — Encounter (HOSPITAL_COMMUNITY)
Admission: RE | Admit: 2014-07-30 | Discharge: 2014-07-30 | Disposition: A | Discharge status: Home / Self Care | Payer: Medicare Other | Source: Ambulatory Visit | Attending: Cardiology | Admitting: Cardiology

## 2014-07-30 DIAGNOSIS — I2102 ST elevation (STEMI) myocardial infarction involving left anterior descending coronary artery: Secondary | ICD-10-CM | POA: Diagnosis not present

## 2014-08-01 ENCOUNTER — Encounter (HOSPITAL_COMMUNITY)
Admission: RE | Admit: 2014-08-01 | Discharge: 2014-08-01 | Disposition: A | Discharge status: Home / Self Care | Payer: Medicare Other | Source: Ambulatory Visit | Attending: Cardiology | Admitting: Cardiology

## 2014-08-01 DIAGNOSIS — I2102 ST elevation (STEMI) myocardial infarction involving left anterior descending coronary artery: Secondary | ICD-10-CM | POA: Diagnosis not present

## 2014-08-04 ENCOUNTER — Encounter (HOSPITAL_COMMUNITY)
Admission: RE | Admit: 2014-08-04 | Discharge: 2014-08-04 | Disposition: A | Discharge status: Home / Self Care | Payer: Medicare Other | Source: Ambulatory Visit | Attending: Cardiology | Admitting: Cardiology

## 2014-08-04 DIAGNOSIS — I2102 ST elevation (STEMI) myocardial infarction involving left anterior descending coronary artery: Secondary | ICD-10-CM | POA: Diagnosis not present

## 2014-08-06 ENCOUNTER — Encounter (HOSPITAL_COMMUNITY)
Admission: RE | Admit: 2014-08-06 | Discharge: 2014-08-06 | Disposition: A | Discharge status: Home / Self Care | Payer: Medicare Other | Source: Ambulatory Visit | Attending: Cardiology | Admitting: Cardiology

## 2014-08-06 DIAGNOSIS — I2102 ST elevation (STEMI) myocardial infarction involving left anterior descending coronary artery: Secondary | ICD-10-CM | POA: Diagnosis not present

## 2014-08-07 ENCOUNTER — Other Ambulatory Visit: Payer: Self-pay

## 2014-08-07 ENCOUNTER — Other Ambulatory Visit (INDEPENDENT_AMBULATORY_CARE_PROVIDER_SITE_OTHER): Payer: Medicare Other

## 2014-08-07 DIAGNOSIS — I2510 Atherosclerotic heart disease of native coronary artery without angina pectoris: Secondary | ICD-10-CM | POA: Diagnosis not present

## 2014-08-07 DIAGNOSIS — I5022 Chronic systolic (congestive) heart failure: Secondary | ICD-10-CM

## 2014-08-08 ENCOUNTER — Encounter (HOSPITAL_COMMUNITY)
Admission: RE | Admit: 2014-08-08 | Discharge: 2014-08-08 | Disposition: A | Discharge status: Home / Self Care | Payer: Medicare Other | Source: Ambulatory Visit | Attending: Cardiology | Admitting: Cardiology

## 2014-08-08 DIAGNOSIS — I2102 ST elevation (STEMI) myocardial infarction involving left anterior descending coronary artery: Secondary | ICD-10-CM | POA: Diagnosis not present

## 2014-08-11 ENCOUNTER — Encounter (HOSPITAL_COMMUNITY)
Admission: RE | Admit: 2014-08-11 | Discharge: 2014-08-11 | Disposition: A | Discharge status: Home / Self Care | Payer: Medicare Other | Source: Ambulatory Visit | Attending: Cardiology | Admitting: Cardiology

## 2014-08-11 DIAGNOSIS — I2102 ST elevation (STEMI) myocardial infarction involving left anterior descending coronary artery: Secondary | ICD-10-CM | POA: Diagnosis not present

## 2014-08-12 ENCOUNTER — Telehealth: Payer: Self-pay | Admitting: *Deleted

## 2014-08-12 NOTE — Telephone Encounter (Signed)
Pt made aware, forwarded to Dr. Manuella Ghazi

## 2014-08-12 NOTE — Telephone Encounter (Signed)
-----   Message from Arnoldo Lenis, MD sent at 08/12/2014 12:05 PM EDT ----- Echo looks good, heart function has gone back to normal  Zandra Abts MD

## 2014-08-13 ENCOUNTER — Encounter (HOSPITAL_COMMUNITY)
Admission: RE | Admit: 2014-08-13 | Discharge: 2014-08-13 | Disposition: A | Discharge status: Home / Self Care | Payer: Medicare Other | Source: Ambulatory Visit | Attending: Cardiology | Admitting: Cardiology

## 2014-08-13 DIAGNOSIS — I2102 ST elevation (STEMI) myocardial infarction involving left anterior descending coronary artery: Secondary | ICD-10-CM | POA: Diagnosis not present

## 2014-08-15 ENCOUNTER — Encounter (HOSPITAL_COMMUNITY)
Admission: RE | Admit: 2014-08-15 | Discharge: 2014-08-15 | Disposition: A | Discharge status: Home / Self Care | Payer: Medicare Other | Source: Ambulatory Visit | Attending: Cardiology | Admitting: Cardiology

## 2014-08-15 DIAGNOSIS — I2102 ST elevation (STEMI) myocardial infarction involving left anterior descending coronary artery: Secondary | ICD-10-CM | POA: Diagnosis not present

## 2014-08-18 ENCOUNTER — Encounter (HOSPITAL_COMMUNITY)
Admission: RE | Admit: 2014-08-18 | Discharge: 2014-08-18 | Disposition: A | Discharge status: Home / Self Care | Payer: Medicare Other | Source: Ambulatory Visit | Attending: Cardiology | Admitting: Cardiology

## 2014-08-18 DIAGNOSIS — I2102 ST elevation (STEMI) myocardial infarction involving left anterior descending coronary artery: Secondary | ICD-10-CM | POA: Diagnosis not present

## 2014-08-20 ENCOUNTER — Encounter (HOSPITAL_COMMUNITY)
Admission: RE | Admit: 2014-08-20 | Discharge: 2014-08-20 | Disposition: A | Discharge status: Home / Self Care | Payer: Medicare Other | Source: Ambulatory Visit | Attending: Cardiology | Admitting: Cardiology

## 2014-08-20 DIAGNOSIS — I2102 ST elevation (STEMI) myocardial infarction involving left anterior descending coronary artery: Secondary | ICD-10-CM | POA: Diagnosis not present

## 2014-08-22 ENCOUNTER — Encounter (HOSPITAL_COMMUNITY): Payer: Medicare Other

## 2014-08-25 ENCOUNTER — Encounter (HOSPITAL_COMMUNITY): Payer: Medicare Other

## 2014-08-27 ENCOUNTER — Encounter (HOSPITAL_COMMUNITY)
Admission: RE | Admit: 2014-08-27 | Discharge: 2014-08-27 | Disposition: A | Discharge status: Home / Self Care | Payer: Medicare Other | Source: Ambulatory Visit | Attending: Cardiology | Admitting: Cardiology

## 2014-08-27 DIAGNOSIS — I2102 ST elevation (STEMI) myocardial infarction involving left anterior descending coronary artery: Secondary | ICD-10-CM | POA: Insufficient documentation

## 2014-08-29 ENCOUNTER — Encounter (HOSPITAL_COMMUNITY)
Admission: RE | Admit: 2014-08-29 | Discharge: 2014-08-29 | Disposition: A | Discharge status: Home / Self Care | Payer: Medicare Other | Source: Ambulatory Visit | Attending: Cardiology | Admitting: Cardiology

## 2014-08-29 DIAGNOSIS — I2102 ST elevation (STEMI) myocardial infarction involving left anterior descending coronary artery: Secondary | ICD-10-CM | POA: Diagnosis not present

## 2014-09-01 ENCOUNTER — Encounter (HOSPITAL_COMMUNITY)
Admission: RE | Admit: 2014-09-01 | Discharge: 2014-09-01 | Disposition: A | Discharge status: Home / Self Care | Payer: Medicare Other | Source: Ambulatory Visit | Attending: Cardiology | Admitting: Cardiology

## 2014-09-01 DIAGNOSIS — I2102 ST elevation (STEMI) myocardial infarction involving left anterior descending coronary artery: Secondary | ICD-10-CM | POA: Diagnosis not present

## 2014-09-01 NOTE — Progress Notes (Signed)
Patient is discharged from [facility] and Pulmonary program today, September 01, 2014 with 36 sessions.  She achieved LTG of 30 minutes of aerobic exercise at max met level of 4.00.  All patient vitals are WNL.  Patient has met with dietician.  Discharge instructions have been reviewed in detail and patient expressed an understanding of material given.  Patient plans to join the maintenance program. Cardiac Rehab will make 1 month, 6 month and 1 year call backs.  Patient had no complaints of any abnormal S/S or pain on their exit visit.  Patient finished post walk test.

## 2014-09-01 NOTE — Progress Notes (Signed)
Cardiac Rehabilitation Program Outcomes Report   Orientation:  05/20/14 Graduate Date:  09/01/14 Discharge Date:  09/01/14 # of sessions completed: 36  Cardiologist: Hawi MD:  Jannifer Franklin Time:  1100  A.  Exercise Program:  Tolerates exercise @ 4.00 METS for 15 minutes, Walk Test Results:  Post: 3.32 and Progressed to Phase 4 maintenance program  B.  Mental Health:  Good mental attitude  C.  Education/Instruction/Skills  Accurately checks own pulse.  Rest:  92  Exercise:  127, Knows THR for exercise and Attended all education classes  Uses Perceived Exertion Scale and/or Dyspnea Scale  D.  Nutrition/Weight Control/Body Composition:  Adherence to prescribed nutrition program: good    E.  Blood Lipids    Lab Results  Component Value Date   CHOL 130 05/05/2014   HDL 36* 05/05/2014   LDLCALC 36 05/05/2014   TRIG 289* 05/05/2014   CHOLHDL 3.6 05/05/2014    F.  Lifestyle Changes:  Making positive lifestyle changes and Not smoking:  Quit Dec 2015  G.  Symptoms noted with exercise:  Patient c/o Dizziness and chest heaviness on session 8 06/16/2014 that radiated down arm and into jaw over the weekend. Pt sent to ED.   Report Completed By:  Stevphen Rochester RN   Comments:  Patient has graduated Cardiac Rehab. Patient done well in program and stated she felt it has helped her a lot. Patient plans to join Maintenance program.

## 2014-09-03 ENCOUNTER — Encounter (HOSPITAL_COMMUNITY): Payer: Medicare Other

## 2014-09-12 ENCOUNTER — Encounter (HOSPITAL_COMMUNITY): Payer: Self-pay | Admitting: Emergency Medicine

## 2014-09-12 ENCOUNTER — Emergency Department (HOSPITAL_COMMUNITY)
Admission: EM | Admit: 2014-09-12 | Discharge: 2014-09-12 | Disposition: A | Discharge status: Home / Self Care | Payer: Medicare Other | Attending: Emergency Medicine | Admitting: Emergency Medicine

## 2014-09-12 DIAGNOSIS — E119 Type 2 diabetes mellitus without complications: Secondary | ICD-10-CM | POA: Insufficient documentation

## 2014-09-12 DIAGNOSIS — Y29XXXA Contact with blunt object, undetermined intent, initial encounter: Secondary | ICD-10-CM | POA: Insufficient documentation

## 2014-09-12 DIAGNOSIS — I252 Old myocardial infarction: Secondary | ICD-10-CM | POA: Diagnosis not present

## 2014-09-12 DIAGNOSIS — Y9389 Activity, other specified: Secondary | ICD-10-CM | POA: Diagnosis not present

## 2014-09-12 DIAGNOSIS — Z8719 Personal history of other diseases of the digestive system: Secondary | ICD-10-CM | POA: Insufficient documentation

## 2014-09-12 DIAGNOSIS — Z23 Encounter for immunization: Secondary | ICD-10-CM | POA: Diagnosis not present

## 2014-09-12 DIAGNOSIS — Z87891 Personal history of nicotine dependence: Secondary | ICD-10-CM | POA: Insufficient documentation

## 2014-09-12 DIAGNOSIS — I10 Essential (primary) hypertension: Secondary | ICD-10-CM | POA: Insufficient documentation

## 2014-09-12 DIAGNOSIS — S61209A Unspecified open wound of unspecified finger without damage to nail, initial encounter: Secondary | ICD-10-CM

## 2014-09-12 DIAGNOSIS — I2510 Atherosclerotic heart disease of native coronary artery without angina pectoris: Secondary | ICD-10-CM | POA: Insufficient documentation

## 2014-09-12 DIAGNOSIS — Y9289 Other specified places as the place of occurrence of the external cause: Secondary | ICD-10-CM | POA: Insufficient documentation

## 2014-09-12 DIAGNOSIS — Y998 Other external cause status: Secondary | ICD-10-CM | POA: Insufficient documentation

## 2014-09-12 DIAGNOSIS — S61212A Laceration without foreign body of right middle finger without damage to nail, initial encounter: Secondary | ICD-10-CM | POA: Diagnosis present

## 2014-09-12 DIAGNOSIS — M1990 Unspecified osteoarthritis, unspecified site: Secondary | ICD-10-CM | POA: Insufficient documentation

## 2014-09-12 MED ORDER — LIDOCAINE HCL (PF) 1 % IJ SOLN
30.0000 mL | Freq: Once | INTRAMUSCULAR | Status: DC
  Filled 2014-09-12: qty 30

## 2014-09-12 MED ORDER — LIDOCAINE HCL (PF) 1 % IJ SOLN
INTRAMUSCULAR | Status: AC
  Filled 2014-09-12: qty 5

## 2014-09-12 MED ORDER — TETANUS-DIPHTH-ACELL PERTUSSIS 5-2.5-18.5 LF-MCG/0.5 IM SUSP
0.5000 mL | Freq: Once | INTRAMUSCULAR | Status: AC
  Administered 2014-09-12: 0.5 mL via INTRAMUSCULAR
  Filled 2014-09-12: qty 0.5

## 2014-09-12 MED ORDER — POVIDONE-IODINE 10 % EX SOLN
CUTANEOUS | Status: AC
  Administered 2014-09-12: 15:00:00
  Filled 2014-09-12: qty 118

## 2014-09-12 NOTE — Discharge Instructions (Signed)
Finger Avulsion  Keep the wound dry for 24 hours. Follow up with your doctor for recheck. Return to the ED if you develop new or worsening symptoms.   When the tip of the finger is lost, a new nail may grow back if part of the fingernail is left. The new nail may be deformed. If just the tip of the finger is lost, no repair may be needed unless there is bone showing. If bone is showing, your caregiver may need to remove the protruding bone and put on a bandage. Your caregiver will do what is best for you. Most of the time when a fingertip is lost, the end will gradually grow back on and look fairly normal, but it may remain sensitive to pressure and temperature extremes for a long time. HOME CARE INSTRUCTIONS   Keep your hand elevated above your heart to relieve pain and swelling.  Keep your dressing dry and clean.  Change your bandage in 24 hours or as directed.  Only take over-the-counter or prescription medicines for pain, discomfort, or fever as directed by your caregiver.  See your caregiver as needed for problems. SEEK MEDICAL CARE IF:   You have increased pain, swelling, drainage, or bleeding.  You have a fever.  You have swelling that spreads from your finger and into your hand. Make sure to check to see if you need a tetanus booster. Document Released: 07/18/2001 Document Revised: 11/08/2011 Document Reviewed: 06/12/2008 [facility] Patient Information 2015 [place], [place]. This information is not intended to replace advice given to you by your health care provider. Make sure you discuss any questions you have with your health care provider.

## 2014-09-12 NOTE — ED Notes (Signed)
Pt reports she was using a slicer and hit her R middle finger. Laceration around nail at end. Pt is on blood thinners.

## 2014-09-12 NOTE — ED Provider Notes (Addendum)
CSN: [id number]     Arrival date & time 09/12/14  1355 History   First MD Initiated Contact with Patient 09/12/14 1411     Chief Complaint  Patient presents with  . Laceration     (Consider location/radiation/quality/duration/timing/severity/associated sxs/prior Treatment) HPI Comments: Using potato slicer and sliced the tip of her right middle finger. Patient is on aspirin and brilinta. No Coumadin use. Denies any weakness, numbness or tingling. History of MI in December. Recently completed cardiac rehabilitation. No chest pain or shortness of breath.  Patient is a 69 y.o. female presenting with skin laceration. The history is provided by the patient.  Laceration Location:  Finger Finger laceration location:  R middle finger Depth:  Through dermis Quality: avulsion and jagged   Bleeding: controlled   Time since incident:  1 hour Laceration mechanism:  Metal edge Pain details:    Quality:  Aching and sharp   Severity:  Moderate   Timing:  Constant   Progression:  Unchanged Foreign body present:  No foreign bodies Relieved by:  Nothing Worsened by:  Nothing tried Ineffective treatments:  None tried Tetanus status:  Out of date   Past Medical History  Diagnosis Date  . Aneurysm     brain aneurysms x 2 x several yrs  . ST elevation (STEMI) myocardial infarction involving left anterior descending coronary artery 05/02/2014  . Ischemic cardiomyopathy 05/02/2014  . HTN (hypertension)   . Coronary artery disease   . Diabetes mellitus without complication   . DDD (degenerative disc disease), cervical   . Osteoarthritis   . Ulcerative colitis    Past Surgical History  Procedure Laterality Date  . Neck surgery    . Left heart catheterization with coronary angiogram N/A 05/02/2014    Procedure: LEFT HEART CATHETERIZATION WITH CORONARY ANGIOGRAM;  Surgeon: Jettie Booze, MD;  Location: [facility] CATH LAB;  Service: Cardiovascular;  Laterality: N/A;  . Cardiac catheterization      . Ptca    . Abdominal hysterectomy    . Bladder removal     Family History  Problem Relation Age of Onset  . Anuerysm Mother   . Alcoholism Father   . Anuerysm Other    History  Substance Use Topics  . Smoking status: Former Smoker -- 1.50 packs/day for 48 years    Types: Cigarettes    Start date: 05/09/1966    Quit date: 05/01/2014  . Smokeless tobacco: Never Used  . Alcohol Use: No   OB History    Gravida Para Term Preterm AB TAB SAB Ectopic Multiple Living   2 2 2             Review of Systems  Constitutional: Negative for fever, activity change and appetite change.  Respiratory: Negative for cough, chest tightness and shortness of breath.   Cardiovascular: Negative for chest pain.  Gastrointestinal: Negative for nausea, vomiting and abdominal pain.  Genitourinary: Negative for dysuria and hematuria.  Musculoskeletal: Negative for myalgias and arthralgias.  Skin: Positive for wound.  Neurological: Negative for dizziness.  A complete 10 system review of systems was obtained and all systems are negative except as noted in the HPI and PMH.      Allergies  Clindamycin/lincomycin; Bactrim; Lipitor; and Morphine and related  Home Medications   Prior to Admission medications   Medication Sig Start Date End Date Taking? Authorizing Provider  ALPRAZolam Duanne Moron) 0.5 MG tablet Take 0.5 mg by mouth 2 (two) times daily as needed for sleep.   Yes Historical Provider,  MD  aspirin 81 MG chewable tablet Chew 1 tablet (81 mg total) by mouth daily. 05/05/14  Yes Rhonda G Barrett, PA-C  atorvastatin (LIPITOR) 80 MG tablet Take 1 tablet (80 mg total) by mouth daily. 05/29/14  Yes Arnoldo Lenis, MD  BRILINTA 90 MG TABS tablet TAKE 1 TABLET BY MOUTH TWICE A DAY 05/26/14  Yes Arnoldo Lenis, MD  carvedilol (COREG) 12.5 MG tablet Take 1 tablet (12.5 mg total) by mouth 2 (two) times daily. 05/29/14  Yes Arnoldo Lenis, MD  ciprofloxacin (CIPRO) 500 MG tablet Take 500 mg by mouth 2  (two) times daily. Starting 09/05/2014 x 10 days.   Yes Historical Provider, MD  enalapril (VASOTEC) 5 MG tablet Take 1 tablet (5 mg total) by mouth 2 (two) times daily. 05/29/14  Yes Arnoldo Lenis, MD  furosemide (LASIX) 20 MG tablet Take 1 tablet (20 mg total) by mouth as needed for edema (swelling or weight gain). 05/29/14  Yes Arnoldo Lenis, MD  LACTOBACILLUS PO Take 2 capsules by mouth daily.   Yes Historical Provider, MD  omeprazole (PRILOSEC) 20 MG capsule Take 20 mg by mouth 2 (two) times daily before a meal.   Yes Historical Provider, MD  spironolactone (ALDACTONE) 25 MG tablet Take 0.5 tablets (12.5 mg total) by mouth daily. 05/29/14  Yes Arnoldo Lenis, MD  topiramate (TOPAMAX) 100 MG tablet Take 100 mg by mouth daily.   Yes Historical Provider, MD  venlafaxine (EFFEXOR) 37.5 MG tablet Take 37.5 mg by mouth daily.    Yes Historical Provider, MD   BP 128/91 mmHg  Pulse 97  Temp(Src) 98.4 F (36.9 C) (Oral)  Resp 18  Ht 5\' 4"  (1.626 m)  Wt 175 lb (79.379 kg)  BMI 30.02 kg/m2  SpO2 98% Physical Exam  Constitutional: She is oriented to person, place, and time. She appears well-developed and well-nourished. No distress.  HENT:  Head: Normocephalic and atraumatic.  Mouth/Throat: Oropharynx is clear and moist. No oropharyngeal exudate.  Eyes: Conjunctivae and EOM are normal. Pupils are equal, round, and reactive to light.  Neck: Normal range of motion. Neck supple.  No meningismus.  Cardiovascular: Normal rate, regular rhythm, normal heart sounds and intact distal pulses.   No murmur heard. Pulmonary/Chest: Effort normal and breath sounds normal. No respiratory distress.  Abdominal: Soft. There is no tenderness. There is no rebound and no guarding.  Musculoskeletal: Normal range of motion. She exhibits no edema or tenderness.  Neurological: She is alert and oriented to person, place, and time. No cranial nerve deficit. She exhibits normal muscle tone. Coordination normal.  No  ataxia on finger to nose bilaterally. No pronator drift. 5/5 strength throughout. CN 2-12 intact. Negative Romberg. Equal grip strength. Sensation intact. Gait is normal.   Skin: Skin is warm.  Irregular avulsion through right third fingertip. Small piece of attached skin appears nonviable. Distal sensation intact. Flexion intact of PIP, DIP, MCP joint  Psychiatric: She has a normal mood and affect. Her behavior is normal.  Nursing note and vitals reviewed.     ED Course  LACERATION REPAIR Date/Time: 09/12/2014 3:19 PM Performed by: Ezequiel Essex Authorized by: Ezequiel Essex Consent: Verbal consent obtained. Risks and benefits: risks, benefits and alternatives were discussed Consent given by: patient Patient understanding: patient states understanding of the procedure being performed Patient consent: the patient's understanding of the procedure matches consent given Patient identity confirmed: provided demographic data Time out: Immediately prior to procedure a "time out" was called to  verify the correct patient, procedure, equipment, support staff and site/side marked as required. Body area: upper extremity Location details: right long finger Laceration length: 2 cm Tendon involvement: none Nerve involvement: none Vascular damage: no Patient sedated: no Preparation: Patient was prepped and draped in the usual sterile fashion. Irrigation solution: tap water Irrigation method: syringe Amount of cleaning: standard Debridement: moderate Degree of undermining: none Approximation: close Approximation difficulty: complex Dressing: 4x4 sterile gauze Patient tolerance: Patient tolerated the procedure well with no immediate complications Comments: Nonviable skin and distal nail tip removed. Quick clot applied to fingertip No suture material used.   (including critical care time) Labs Review Labs Reviewed - No data to display  Imaging Review No results found.   EKG  Interpretation None      MDM   Final diagnoses:  Fingertip avulsion, initial encounter   Finger tip avulsion with loss of skin and edge of nail plate. Bleeding controlled.  Wound repaired as above. Bleeding controlled with quick clot.  Tetanus updated. Follow with PCP this week. Keep wound dry for 24 hours. Discussed care with patient and husband. Return to the ED with new or worsening symptoms.    Ezequiel Essex, MD 09/12/14 [id number]  Ezequiel Essex, MD 09/30/14 1310

## 2014-09-12 NOTE — ED Notes (Signed)
Telfa and tube gauze applied to right middle finger

## 2014-09-23 ENCOUNTER — Encounter: Payer: Self-pay | Admitting: Cardiology

## 2014-09-23 ENCOUNTER — Encounter: Payer: Self-pay | Admitting: *Deleted

## 2014-09-23 ENCOUNTER — Ambulatory Visit (INDEPENDENT_AMBULATORY_CARE_PROVIDER_SITE_OTHER): Payer: Medicare Other | Admitting: Cardiology

## 2014-09-23 VITALS — BP 104/67 | HR 73 | Ht 64.0 in | Wt 179.0 lb

## 2014-09-23 DIAGNOSIS — I10 Essential (primary) hypertension: Secondary | ICD-10-CM

## 2014-09-23 DIAGNOSIS — I5022 Chronic systolic (congestive) heart failure: Secondary | ICD-10-CM

## 2014-09-23 NOTE — Progress Notes (Signed)
Clinical Summary Casey Avila is a 69 y.o.female seen today for follow up of the following medical problems.   1. CAD/ICM - admitted with anterior STEMI 05/02/14, s/p DES to LAD. LVEF 40% by echo at that time. Repeat echo 07/2014 shows LVEF 55-60%.   - continues to have atypical chest pain at times. Sharp tingling pain midchest. 2/10 in severity. Lasts just for just a few seconds. Typically occurs at rest. No other associated symptoms. Not positional. Occurs a few times a week. Chronic neck pain, can have numbess radiating down arm. Symptoms consistent with her known cervical disease - occas LE edema. Limiting sodium intake. Stable weights at home 175 lbs.  - walks regularly, 3 miles twice week - compliant with meds  2. HTN - bps 100s/70s at home - compliant with meds Past Medical History  Diagnosis Date  . Aneurysm     brain aneurysms x 2 x several yrs  . ST elevation (STEMI) myocardial infarction involving left anterior descending coronary artery 05/02/2014  . Ischemic cardiomyopathy 05/02/2014  . HTN (hypertension)   . Coronary artery disease   . Diabetes mellitus without complication   . DDD (degenerative disc disease), cervical   . Osteoarthritis   . Ulcerative colitis      Allergies  Allergen Reactions  . Clindamycin/Lincomycin Itching and Rash    Had to go into ER   . Bactrim [Sulfamethoxazole-Trimethoprim] Rash  . Lipitor [Atorvastatin] Other (See Comments)    Aching but still takes per order from Cardiologist   . Morphine And Related     Makes me sick      Current Outpatient Prescriptions  Medication Sig Dispense Refill  . ALPRAZolam (XANAX) 0.5 MG tablet Take 0.5 mg by mouth 2 (two) times daily as needed for sleep.    Marland Kitchen aspirin 81 MG chewable tablet Chew 1 tablet (81 mg total) by mouth daily.    Marland Kitchen atorvastatin (LIPITOR) 80 MG tablet Take 1 tablet (80 mg total) by mouth daily. 90 tablet 3  . BRILINTA 90 MG TABS tablet TAKE 1 TABLET BY MOUTH TWICE A DAY 60  tablet 6  . carvedilol (COREG) 12.5 MG tablet Take 1 tablet (12.5 mg total) by mouth 2 (two) times daily. 180 tablet 3  . ciprofloxacin (CIPRO) 500 MG tablet Take 500 mg by mouth 2 (two) times daily. Starting 09/05/2014 x 10 days.    . enalapril (VASOTEC) 5 MG tablet Take 1 tablet (5 mg total) by mouth 2 (two) times daily. 180 tablet 3  . furosemide (LASIX) 20 MG tablet Take 1 tablet (20 mg total) by mouth as needed for edema (swelling or weight gain). 90 tablet 0  . LACTOBACILLUS PO Take 2 capsules by mouth daily.    Marland Kitchen omeprazole (PRILOSEC) 20 MG capsule Take 20 mg by mouth 2 (two) times daily before a meal.    . spironolactone (ALDACTONE) 25 MG tablet Take 0.5 tablets (12.5 mg total) by mouth daily. 45 tablet 3  . topiramate (TOPAMAX) 100 MG tablet Take 100 mg by mouth daily.    Marland Kitchen venlafaxine (EFFEXOR) 37.5 MG tablet Take 37.5 mg by mouth daily.      No current facility-administered medications for this visit.     Past Surgical History  Procedure Laterality Date  . Neck surgery    . Left heart catheterization with coronary angiogram N/A 05/02/2014    Procedure: LEFT HEART CATHETERIZATION WITH CORONARY ANGIOGRAM;  Surgeon: Jettie Booze, MD;  Location: [facility] CATH LAB;  Service: Cardiovascular;  Laterality: N/A;  . Cardiac catheterization    . Ptca    . Abdominal hysterectomy    . Bladder removal       Allergies  Allergen Reactions  . Clindamycin/Lincomycin Itching and Rash    Had to go into ER   . Bactrim [Sulfamethoxazole-Trimethoprim] Rash  . Lipitor [Atorvastatin] Other (See Comments)    Aching but still takes per order from Cardiologist   . Morphine And Related     Makes me sick       Family History  Problem Relation Age of Onset  . Anuerysm Mother   . Alcoholism Father   . Anuerysm Other      Social History Casey Avila reports that she quit smoking about 4 months ago. Her smoking use included Cigarettes. She started smoking about 48 years ago. She has a 72  pack-year smoking history. She has never used smokeless tobacco. Casey Avila reports that she does not drink alcohol.   Review of Systems CONSTITUTIONAL: No weight loss, fever, chills, weakness or fatigue.  HEENT: Eyes: No visual loss, blurred vision, double vision or yellow sclerae.No hearing loss, sneezing, congestion, runny nose or sore throat.  SKIN: No rash or itching.  CARDIOVASCULAR: per HPI RESPIRATORY: No shortness of breath, cough or sputum.  GASTROINTESTINAL: No anorexia, nausea, vomiting or diarrhea. No abdominal pain or blood.  GENITOURINARY: No burning on urination, no polyuria NEUROLOGICAL: No headache, dizziness, syncope, paralysis, ataxia, numbness or tingling in the extremities. No change in bowel or bladder control.  MUSCULOSKELETAL: No muscle, back pain, joint pain or stiffness.  LYMPHATICS: No enlarged nodes. No history of splenectomy.  PSYCHIATRIC: No history of depression or anxiety.  ENDOCRINOLOGIC: No reports of sweating, cold or heat intolerance. No polyuria or polydipsia.  Marland Kitchen   Physical Examination p 73 bp 104/67 Wt 179 lbs BMI 31 Gen: resting comfortably, no acute distress HEENT: no scleral icterus, pupils equal round and reactive, no palptable cervical adenopathy,  CV: RRR, no m/r/g, no JVD, no carotid bruits Resp: Clear to auscultation bilaterally GI: abdomen is soft, non-tender, non-distended, normal bowel sounds, no hepatosplenomegaly MSK: extremities are warm, no edema.  Skin: warm, no rash Neuro:  no focal deficits Psych: appropriate affect   Diagnostic Studies  04/2014 Echo Study Conclusions  - Left ventricle: Can not rule out apical clot. Severe hypokinesis mid/apical inferior segments, mid/apical anterior segments, mid anteroseptal segment. The cavity size was normal. Wall thickness was increased in a pattern of mild LVH. The estimated ejection fraction was 40%. Doppler parameters are consistent with abnormal left ventricular  relaxation (grade 1 diastolic dysfunction). - Right ventricle: The cavity size was normal. Systolic function was normal.  04/2014 Cath  HEMODYNAMICS: Aortic pressure was 132/67; LV pressure was 136/19; LVEDP 34. There was no gradient between the left ventricle and aorta.   ANGIOGRAPHIC DATA: The left main coronary artery is widely patent.  The left anterior descending artery is a large vessel. The vessel is occluded in the midportion. After intervention, it was noted that there was disease just past the occlusion. The remainder of the mid to distal LAD was widely patent. There were several small diagonals which were patent.  The left circumflex artery is a large vessel with mild irregularities proximally. There is a large first obtuse marginal which is widely patent. The second obtuse marginal is large and widely patent.  The right coronary artery is a large dominant vessel. In the mid vessel, there is an eccentric 40% stenosis. The  posterior descending artery is large and widely patent. The posterior lateral artery is large and widely patent.  LEFT VENTRICULOGRAM: Left ventricular angiogram was not done due to elevated LVEDP. LVEDP was 34 mmHg.  PCI NARRATIVE: A CLS 3.0 guiding catheter was used to engage the left main. A pro-water wire was placed across the area disease in the LAD. A fetch thrombectomy catheter was used to perform aspiration thrombectomy. There was significant thrombus removal. A second pass was performed with even more thrombus removal. TIMI-3 flow was restored after the first pass. There was a residual mid vessel lesion which was about 12 mm in length, up to 80%. Just past this, there was another segment of moderate disease. The more proximal area was ballooned with a 2.5 x 12 balloon. The entire area was stented with a 2.75 x 28 Promus drug-eluting stent, postdilated to greater than 3 mm in diameter. There was some sluggish flow in the very apical LAD. Multiple doses  of IC adenosine were given. TIMI-3 flow was present at the end of the procedure. The patient was pain-free.  IMPRESSIONS:  1. Normal left main coronary artery. 2. Occluded mid left anterior descending artery which was the culprit for today's presentation. This was successfully treated with a 2.75 x 28 Promus drug-eluting stent, postdilated to greater than 3 mm in diameter. 3. Mild disease in the left circumflex artery and its branches. 4. Mild to moderate disease in the right coronary artery. 5. Left ventricular systolic function was not assessed. LVEDP 34 mmHg.  RECOMMENDATION: We'll watch her in the ICU. Start low-dose beta blocker. Start high-dose statin. She needs aggressive secondary prevention. Of note, during the case, we checked about the safety of dual antiplatelet therapy with Dr. Ellene Route, as the patient has small brain aneurysms per her report. She was thought to be appropriate for prolonged dual antiplatelet therapy. She will need dual antiplatelet therapy for at least a year. Check echocardiogram to evaluate LV function.    07/2014 Echo Study Conclusions  - Left ventricle: The cavity size was normal. Wall thickness was normal. Systolic function was normal. The estimated ejection fraction was in the range of 55% to 60%. There is mild hypokinesis of the mid-apicalanteroseptal myocardium. Doppler parameters are consistent with abnormal left ventricular relaxation (grade 1 diastolic dysfunction). Doppler parameters are consistent with elevated ventricular end-diastolic filling pressure. - Aortic valve: Mildly calcified annulus. Trileaflet. - Left atrium: The atrium was mildly dilated. - Right atrium: Central venous pressure (est): 3 mm Hg. - Atrial septum: There was increased thickness of the septum, consistent with lipomatous hypertrophy. - Pulmonary arteries: Systolic pressure could not be accurately estimated. - Pericardium, extracardiac: There was no  pericardial effusion.  Impressions:  - Normal LV wall thickness with LVEF 55-60%, mild mid to apical anteroseptal hypokinesis, and grade 1 diastolic dysfunction. There has been improvement in LVEF compared to the prior study from December 2015. Mild left atrial enlargement. Unable to assess PASP.   Assessment and Plan   1. CAD/ICM - hx of anterior STEMI with DES to LAD, continue DAPT at least until 04/2015. - LVEF initially 40% but has since improved to 55-60% - atypical chest pain symptoms likely related to chronic neck issues   2. HTN - at goal, continue current meds    F/u 6 month. Request most recent labs from pcp     Arnoldo Lenis, M.D.

## 2014-09-23 NOTE — Patient Instructions (Signed)
Your physician wants you to follow-up in: 6 months with Dr. Bryna Colander will receive a reminder letter in the mail two months in advance. If you don't receive a letter, please call our office to schedule the follow-up appointment.  Your physician recommends that you continue on your current medications as directed. Please refer to the Current Medication list given to you today.  WE WILL REQUEST LABS FROM DR. [facility]  Thank you for choosing [facility]!!

## 2014-11-17 ENCOUNTER — Telehealth: Payer: Self-pay | Admitting: Cardiology

## 2014-11-17 NOTE — Telephone Encounter (Signed)
Please call patient to confirm it is ok to wait until Friday to be seen.

## 2014-11-18 ENCOUNTER — Encounter: Payer: Self-pay | Admitting: *Deleted

## 2014-11-18 NOTE — Telephone Encounter (Signed)
Pt voices understanding of plan and will f/u.

## 2014-11-18 NOTE — Telephone Encounter (Signed)
Pt says she has SOB/some dizziness when she exerts herself, says she feels fine fine when stays resting. BP has been running low 90s/60s, denies swelling. Had recent visit with Dr. Manuella Ghazi and says he didn't seem to think anything was concerning but did order blood work and will request. Told pt to call office if symptoms worsen or change. Will forward to Dr. Harl Bowie

## 2014-11-18 NOTE — Telephone Encounter (Signed)
BP sounds like its on the low side. i would decrease her coreg to 6.25mg  bid and see if she feels any better. Friday is ok for now, have her keep [place] updated on symptoms after med change  Zandra Abts MD

## 2014-11-21 ENCOUNTER — Encounter: Payer: Self-pay | Admitting: Cardiology

## 2014-11-21 ENCOUNTER — Ambulatory Visit (INDEPENDENT_AMBULATORY_CARE_PROVIDER_SITE_OTHER): Payer: Medicare Other | Admitting: Cardiology

## 2014-11-21 ENCOUNTER — Ambulatory Visit: Payer: Medicare Other | Admitting: Cardiology

## 2014-11-21 VITALS — BP 110/62 | HR 90 | Ht 64.0 in | Wt 186.0 lb

## 2014-11-21 DIAGNOSIS — R072 Precordial pain: Secondary | ICD-10-CM | POA: Diagnosis not present

## 2014-11-21 DIAGNOSIS — I2510 Atherosclerotic heart disease of native coronary artery without angina pectoris: Secondary | ICD-10-CM | POA: Diagnosis not present

## 2014-11-21 NOTE — Progress Notes (Signed)
Clinical Summary Ms. Dunkley is a 69 y.o.female seen today for follow up of the following medical problems. This is a focused visit on her history of CAD and recent episodes of chest pain.   1. CAD/ICM - admitted with anterior STEMI 05/02/14, s/p DES to LAD. LVEF 40% by echo at that time. Repeat echo 07/2014 shows LVEF 55-60%.   - continues to have atypical chest pain at times, previously related to chronic neck/cervical disease. Most recent symptoms started about 1 week. Under left breast, pressure severe pain. Radiate to bilatearal, bilateral shoulder blades. Not positional. Symptoms tended to occur with standing or activity. No other associated symptoms. No relation to food. Pain would last few minutes. Symptoms would occur with any movement.  - noted some low blood pressures, dizziness. Tired, fatigued. BP 90/60s,  - we decreased her coreg to 6.25mg  bid and since then all symptoms have resolved.    Past Medical History  Diagnosis Date  . Aneurysm     brain aneurysms x 2 x several yrs  . ST elevation (STEMI) myocardial infarction involving left anterior descending coronary artery 05/02/2014  . Ischemic cardiomyopathy 05/02/2014  . HTN (hypertension)   . Coronary artery disease   . Diabetes mellitus without complication   . DDD (degenerative disc disease), cervical   . Osteoarthritis   . Ulcerative colitis      Allergies  Allergen Reactions  . Clindamycin/Lincomycin Itching and Rash    Had to go into ER   . Bactrim [Sulfamethoxazole-Trimethoprim] Rash  . Lipitor [Atorvastatin] Other (See Comments)    Aching but still takes per order from Cardiologist   . Morphine And Related     Makes me sick      Current Outpatient Prescriptions  Medication Sig Dispense Refill  . ALPRAZolam (XANAX) 0.5 MG tablet Take 0.5 mg by mouth 2 (two) times daily as needed for sleep.    Marland Kitchen aspirin 81 MG chewable tablet Chew 1 tablet (81 mg total) by mouth daily.    Marland Kitchen atorvastatin (LIPITOR) 80  MG tablet Take 1 tablet (80 mg total) by mouth daily. (Patient taking differently: Take 80 mg by mouth daily. BRAND NAME ONLY) 90 tablet 3  . BRILINTA 90 MG TABS tablet TAKE 1 TABLET BY MOUTH TWICE A DAY 60 tablet 6  . carvedilol (COREG) 12.5 MG tablet Take 1 tablet (12.5 mg total) by mouth 2 (two) times daily. 180 tablet 3  . enalapril (VASOTEC) 5 MG tablet Take 1 tablet (5 mg total) by mouth 2 (two) times daily. 180 tablet 3  . furosemide (LASIX) 20 MG tablet Take 1 tablet (20 mg total) by mouth as needed for edema (swelling or weight gain). 90 tablet 0  . LACTOBACILLUS PO Take 2 capsules by mouth daily.    Marland Kitchen omeprazole (PRILOSEC) 20 MG capsule Take 20 mg by mouth 2 (two) times daily before a meal.    . spironolactone (ALDACTONE) 25 MG tablet Take 0.5 tablets (12.5 mg total) by mouth daily. 45 tablet 3  . topiramate (TOPAMAX) 100 MG tablet Take 100 mg by mouth daily.    Marland Kitchen venlafaxine (EFFEXOR) 37.5 MG tablet Take 37.5 mg by mouth daily.      No current facility-administered medications for this visit.     Past Surgical History  Procedure Laterality Date  . Neck surgery    . Left heart catheterization with coronary angiogram N/A 05/02/2014    Procedure: LEFT HEART CATHETERIZATION WITH CORONARY ANGIOGRAM;  Surgeon: Jettie Booze,  MD;  Location: [facility] CATH LAB;  Service: Cardiovascular;  Laterality: N/A;  . Cardiac catheterization    . Ptca    . Abdominal hysterectomy    . Bladder removal       Allergies  Allergen Reactions  . Clindamycin/Lincomycin Itching and Rash    Had to go into ER   . Bactrim [Sulfamethoxazole-Trimethoprim] Rash  . Lipitor [Atorvastatin] Other (See Comments)    Aching but still takes per order from Cardiologist   . Morphine And Related     Makes me sick       Family History  Problem Relation Age of Onset  . Anuerysm Mother   . Alcoholism Father   . Anuerysm Other      Social History Ms. Mauch reports that she quit smoking about 6 months ago.  Her smoking use included Cigarettes. She started smoking about 48 years ago. She has a 72 pack-year smoking history. She has never used smokeless tobacco. Ms. Mcglown reports that she does not drink alcohol.   Review of Systems CONSTITUTIONAL: No weight loss, fever, chills, weakness or fatigue.  HEENT: Eyes: No visual loss, blurred vision, double vision or yellow sclerae.No hearing loss, sneezing, congestion, runny nose or sore throat.  SKIN: No rash or itching.  CARDIOVASCULAR:  RESPIRATORY: No shortness of breath, cough or sputum.  GASTROINTESTINAL: No anorexia, nausea, vomiting or diarrhea. No abdominal pain or blood.  GENITOURINARY: No burning on urination, no polyuria NEUROLOGICAL: No headache, dizziness, syncope, paralysis, ataxia, numbness or tingling in the extremities. No change in bowel or bladder control.  MUSCULOSKELETAL: No muscle, back pain, joint pain or stiffness.  LYMPHATICS: No enlarged nodes. No history of splenectomy.  PSYCHIATRIC: No history of depression or anxiety.  ENDOCRINOLOGIC: No reports of sweating, cold or heat intolerance. No polyuria or polydipsia.  Marland Kitchen   Physical Examination Filed Vitals:   11/21/14 1522  BP: 110/62  Pulse: 90   Filed Vitals:   11/21/14 1522  Height: 5\' 4"  (1.626 m)  Weight: 186 lb (84.369 kg)    Gen: resting comfortably, no acute distress HEENT: no scleral icterus, pupils equal round and reactive, no palptable cervical adenopathy,  CV: RRR, no m/r/g, no JVD Resp: Clear to auscultation bilaterally GI: abdomen is soft, non-tender, non-distended, normal bowel sounds, no hepatosplenomegaly MSK: extremities are warm, no edema.  Skin: warm, no rash Neuro:  no focal deficits Psych: appropriate affect   Diagnostic Studies 04/2014 Echo Study Conclusions  - Left ventricle: Can not rule out apical clot. Severe hypokinesis mid/apical inferior segments, mid/apical anterior segments, mid anteroseptal segment. The cavity size was  normal. Wall thickness was increased in a pattern of mild LVH. The estimated ejection fraction was 40%. Doppler parameters are consistent with abnormal left ventricular relaxation (grade 1 diastolic dysfunction). - Right ventricle: The cavity size was normal. Systolic function was normal.  04/2014 Cath  HEMODYNAMICS: Aortic pressure was 132/67; LV pressure was 136/19; LVEDP 34. There was no gradient between the left ventricle and aorta.   ANGIOGRAPHIC DATA: The left main coronary artery is widely patent.  The left anterior descending artery is a large vessel. The vessel is occluded in the midportion. After intervention, it was noted that there was disease just past the occlusion. The remainder of the mid to distal LAD was widely patent. There were several small diagonals which were patent.  The left circumflex artery is a large vessel with mild irregularities proximally. There is a large first obtuse marginal which is widely patent. The second  obtuse marginal is large and widely patent.  The right coronary artery is a large dominant vessel. In the mid vessel, there is an eccentric 40% stenosis. The posterior descending artery is large and widely patent. The posterior lateral artery is large and widely patent.  LEFT VENTRICULOGRAM: Left ventricular angiogram was not done due to elevated LVEDP. LVEDP was 34 mmHg.  PCI NARRATIVE: A CLS 3.0 guiding catheter was used to engage the left main. A pro-water wire was placed across the area disease in the LAD. A fetch thrombectomy catheter was used to perform aspiration thrombectomy. There was significant thrombus removal. A second pass was performed with even more thrombus removal. TIMI-3 flow was restored after the first pass. There was a residual mid vessel lesion which was about 12 mm in length, up to 80%. Just past this, there was another segment of moderate disease. The more proximal area was ballooned with a 2.5 x 12 balloon. The  entire area was stented with a 2.75 x 28 Promus drug-eluting stent, postdilated to greater than 3 mm in diameter. There was some sluggish flow in the very apical LAD. Multiple doses of IC adenosine were given. TIMI-3 flow was present at the end of the procedure. The patient was pain-free.  IMPRESSIONS:  1. Normal left main coronary artery. 2. Occluded mid left anterior descending artery which was the culprit for today's presentation. This was successfully treated with a 2.75 x 28 Promus drug-eluting stent, postdilated to greater than 3 mm in diameter. 3. Mild disease in the left circumflex artery and its branches. 4. Mild to moderate disease in the right coronary artery. 5. Left ventricular systolic function was not assessed. LVEDP 34 mmHg.  RECOMMENDATION: We'll watch her in the ICU. Start low-dose beta blocker. Start high-dose statin. She needs aggressive secondary prevention. Of note, during the case, we checked about the safety of dual antiplatelet therapy with Dr. Ellene Route, as the patient has small brain aneurysms per her report. She was thought to be appropriate for prolonged dual antiplatelet therapy. She will need dual antiplatelet therapy for at least a year. Check echocardiogram to evaluate LV function.    07/2014 Echo Study Conclusions  - Left ventricle: The cavity size was normal. Wall thickness was normal. Systolic function was normal. The estimated ejection fraction was in the range of 55% to 60%. There is mild hypokinesis of the mid-apicalanteroseptal myocardium. Doppler parameters are consistent with abnormal left ventricular relaxation (grade 1 diastolic dysfunction). Doppler parameters are consistent with elevated ventricular end-diastolic filling pressure. - Aortic valve: Mildly calcified annulus. Trileaflet. - Left atrium: The atrium was mildly dilated. - Right atrium: Central venous pressure (est): 3 mm Hg. - Atrial septum: There was increased  thickness of the septum, consistent with lipomatous hypertrophy. - Pulmonary arteries: Systolic pressure could not be accurately estimated. - Pericardium, extracardiac: There was no pericardial effusion.  Impressions:  - Normal LV wall thickness with LVEF 55-60%, mild mid to apical anteroseptal hypokinesis, and grade 1 diastolic dysfunction. There has been improvement in LVEF compared to the prior study from December 2015. Mild left atrial enlargement. Unable to assess PASP.      Assessment and Plan  1. CAD/ICM - hx of anterior STEMI with DES to LAD, continue DAPT at least until 04/2015. - LVEF initially 40% but has since improved to 55-60% - atypical chest pain symptoms likely related to chronic neck issues. She had prior chest pain during cardiac rehab that resolved with wearing her neckbrace during exercise - recent symptoms of  chest pain, backpain, shoulder pain, dizziness resolved with decreased coreg - most recent symptoms resolved with decreased her coreg, will continue to follow.     F/u 6 weeks      Arnoldo Lenis, M.D.

## 2014-11-21 NOTE — Patient Instructions (Signed)
Your physician recommends that you schedule a follow-up appointment in: 6 weeks Dr Harl Bowie    Your physician recommends that you continue on your current medications as directed. Please refer to the Current Medication list given to you today.     Thank you for choosing [facility] !

## 2014-12-01 ENCOUNTER — Other Ambulatory Visit: Payer: Self-pay

## 2014-12-01 DIAGNOSIS — Z1231 Encounter for screening mammogram for malignant neoplasm of breast: Secondary | ICD-10-CM

## 2014-12-03 ENCOUNTER — Ambulatory Visit
Admission: RE | Admit: 2014-12-03 | Discharge: 2014-12-03 | Disposition: A | Discharge status: Home / Self Care | Payer: Medicare Other | Source: Ambulatory Visit

## 2014-12-03 DIAGNOSIS — Z1231 Encounter for screening mammogram for malignant neoplasm of breast: Secondary | ICD-10-CM

## 2015-01-01 NOTE — Progress Notes (Signed)
Patient ID: Casey Avila, female   DOB: 1945/12/01, 69 y.o.   MRN: [id number]     Clinical Summary Ms. Capek is a 69 y.o.female seen today for follow up of the following medical problems.   1. CAD/ICM - admitted with anterior STEMI 05/02/14, s/p DES to LAD. LVEF 40% by echo at that time. Repeat echo 07/2014 shows LVEF 55-60%.   - no chest pain. Continues to have severe neck pain, radiates to shoulder and jaw. Better with neck brace.   2. HTN - stable 110s/70s at home - compliant with meds.    Past Medical History  Diagnosis Date  . Aneurysm     brain aneurysms x 2 x several yrs  . ST elevation (STEMI) myocardial infarction involving left anterior descending coronary artery 05/02/2014  . Ischemic cardiomyopathy 05/02/2014  . HTN (hypertension)   . Coronary artery disease   . Diabetes mellitus without complication   . DDD (degenerative disc disease), cervical   . Osteoarthritis   . Ulcerative colitis      Allergies  Allergen Reactions  . Clindamycin/Lincomycin Itching and Rash    Had to go into ER   . Bactrim [Sulfamethoxazole-Trimethoprim] Rash  . Lipitor [Atorvastatin] Other (See Comments)    Aching but still takes per order from Cardiologist   . Morphine And Related     Makes me sick      Current Outpatient Prescriptions  Medication Sig Dispense Refill  . ALPRAZolam (XANAX) 0.5 MG tablet Take 0.5 mg by mouth 2 (two) times daily as needed for sleep.    Marland Kitchen aspirin 81 MG chewable tablet Chew 1 tablet (81 mg total) by mouth daily.    Marland Kitchen atorvastatin (LIPITOR) 80 MG tablet Take 1 tablet (80 mg total) by mouth daily. (Patient taking differently: Take 80 mg by mouth daily. BRAND NAME ONLY) 90 tablet 3  . BRILINTA 90 MG TABS tablet TAKE 1 TABLET BY MOUTH TWICE A DAY 60 tablet 6  . carvedilol (COREG) 6.25 MG tablet Take 6.25 mg by mouth 2 (two) times daily with a meal.    . enalapril (VASOTEC) 5 MG tablet Take 1 tablet (5 mg total) by mouth 2 (two) times daily. 180  tablet 3  . furosemide (LASIX) 20 MG tablet Take 1 tablet (20 mg total) by mouth as needed for edema (swelling or weight gain). 90 tablet 0  . HYDROcodone-acetaminophen (NORCO/VICODIN) 5-325 MG per tablet Take 1 tablet by mouth every 6 (six) hours as needed for moderate pain.    Marland Kitchen LACTOBACILLUS PO Take 2 capsules by mouth daily.    Marland Kitchen omeprazole (PRILOSEC) 20 MG capsule Take 20 mg by mouth 2 (two) times daily before a meal.    . spironolactone (ALDACTONE) 25 MG tablet Take 0.5 tablets (12.5 mg total) by mouth daily. 45 tablet 3  . topiramate (TOPAMAX) 100 MG tablet Take 100 mg by mouth daily.    Marland Kitchen venlafaxine (EFFEXOR) 75 MG tablet Take 75 mg by mouth 2 (two) times daily.     No current facility-administered medications for this visit.     Past Surgical History  Procedure Laterality Date  . Neck surgery    . Left heart catheterization with coronary angiogram N/A 05/02/2014    Procedure: LEFT HEART CATHETERIZATION WITH CORONARY ANGIOGRAM;  Surgeon: Jettie Booze, MD;  Location: [facility] CATH LAB;  Service: Cardiovascular;  Laterality: N/A;  . Cardiac catheterization    . Ptca    . Abdominal hysterectomy    . Bladder removal  Allergies  Allergen Reactions  . Clindamycin/Lincomycin Itching and Rash    Had to go into ER   . Bactrim [Sulfamethoxazole-Trimethoprim] Rash  . Lipitor [Atorvastatin] Other (See Comments)    Aching but still takes per order from Cardiologist   . Morphine And Related     Makes me sick       Family History  Problem Relation Age of Onset  . Anuerysm Mother   . Alcoholism Father   . Anuerysm Other      Social History Ms. Rathgeber reports that she quit smoking about 8 months ago. Her smoking use included Cigarettes. She started smoking about 48 years ago. She has a 72 pack-year smoking history. She has never used smokeless tobacco. Ms. Neises reports that she does not drink alcohol.   Review of Systems CONSTITUTIONAL: No weight loss, fever,  chills, weakness or fatigue.  HEENT: neck pain SKIN: No rash or itching.  CARDIOVASCULAR: per HPI RESPIRATORY: No shortness of breath, cough or sputum.  GASTROINTESTINAL: No anorexia, nausea, vomiting or diarrhea. No abdominal pain or blood.  GENITOURINARY: No burning on urination, no polyuria NEUROLOGICAL: No headache, dizziness, syncope, paralysis, ataxia, numbness or tingling in the extremities. No change in bowel or bladder control.  MUSCULOSKELETAL: No muscle, back pain, joint pain or stiffness.  LYMPHATICS: No enlarged nodes. No history of splenectomy.  PSYCHIATRIC: No history of depression or anxiety.  ENDOCRINOLOGIC: No reports of sweating, cold or heat intolerance. No polyuria or polydipsia.  Marland Kitchen   Physical Examination Filed Vitals:   01/02/15 0826  BP: 110/70  Pulse: 82   Filed Vitals:   01/02/15 0826  Height: 5\' 4"  (1.626 m)  Weight: 189 lb (85.73 kg)    Gen: resting comfortably, no acute distress HEENT: no scleral icterus, pupils equal round and reactive, no palptable cervical adenopathy,  CV: RRR, no m/r/g, no JVD Resp: Clear to auscultation bilaterally GI: abdomen is soft, non-tender, non-distended, normal bowel sounds, no hepatosplenomegaly MSK: extremities are warm, no edema.  Skin: warm, no rash Neuro:  no focal deficits Psych: appropriate affect   Diagnostic Studies  04/2014 Echo Study Conclusions  - Left ventricle: Can not rule out apical clot. Severe hypokinesis mid/apical inferior segments, mid/apical anterior segments, mid anteroseptal segment. The cavity size was normal. Wall thickness was increased in a pattern of mild LVH. The estimated ejection fraction was 40%. Doppler parameters are consistent with abnormal left ventricular relaxation (grade 1 diastolic dysfunction). - Right ventricle: The cavity size was normal. Systolic function was normal.  04/2014 Cath  HEMODYNAMICS: Aortic pressure was 132/67; LV pressure was 136/19;  LVEDP 34. There was no gradient between the left ventricle and aorta.   ANGIOGRAPHIC DATA: The left main coronary artery is widely patent.  The left anterior descending artery is a large vessel. The vessel is occluded in the midportion. After intervention, it was noted that there was disease just past the occlusion. The remainder of the mid to distal LAD was widely patent. There were several small diagonals which were patent.  The left circumflex artery is a large vessel with mild irregularities proximally. There is a large first obtuse marginal which is widely patent. The second obtuse marginal is large and widely patent.  The right coronary artery is a large dominant vessel. In the mid vessel, there is an eccentric 40% stenosis. The posterior descending artery is large and widely patent. The posterior lateral artery is large and widely patent.  LEFT VENTRICULOGRAM: Left ventricular angiogram was not done due to  elevated LVEDP. LVEDP was 34 mmHg.  PCI NARRATIVE: A CLS 3.0 guiding catheter was used to engage the left main. A pro-water wire was placed across the area disease in the LAD. A fetch thrombectomy catheter was used to perform aspiration thrombectomy. There was significant thrombus removal. A second pass was performed with even more thrombus removal. TIMI-3 flow was restored after the first pass. There was a residual mid vessel lesion which was about 12 mm in length, up to 80%. Just past this, there was another segment of moderate disease. The more proximal area was ballooned with a 2.5 x 12 balloon. The entire area was stented with a 2.75 x 28 Promus drug-eluting stent, postdilated to greater than 3 mm in diameter. There was some sluggish flow in the very apical LAD. Multiple doses of IC adenosine were given. TIMI-3 flow was present at the end of the procedure. The patient was pain-free.  IMPRESSIONS:  1. Normal left main coronary artery. 2. Occluded mid left anterior descending  artery which was the culprit for today's presentation. This was successfully treated with a 2.75 x 28 Promus drug-eluting stent, postdilated to greater than 3 mm in diameter. 3. Mild disease in the left circumflex artery and its branches. 4. Mild to moderate disease in the right coronary artery. 5. Left ventricular systolic function was not assessed. LVEDP 34 mmHg.  RECOMMENDATION: We'll watch her in the ICU. Start low-dose beta blocker. Start high-dose statin. She needs aggressive secondary prevention. Of note, during the case, we checked about the safety of dual antiplatelet therapy with Dr. Ellene Route, as the patient has small brain aneurysms per her report. She was thought to be appropriate for prolonged dual antiplatelet therapy. She will need dual antiplatelet therapy for at least a year. Check echocardiogram to evaluate LV function.    07/2014 Echo Study Conclusions  - Left ventricle: The cavity size was normal. Wall thickness was normal. Systolic function was normal. The estimated ejection fraction was in the range of 55% to 60%. There is mild hypokinesis of the mid-apicalanteroseptal myocardium. Doppler parameters are consistent with abnormal left ventricular relaxation (grade 1 diastolic dysfunction). Doppler parameters are consistent with elevated ventricular end-diastolic filling pressure. - Aortic valve: Mildly calcified annulus. Trileaflet. - Left atrium: The atrium was mildly dilated. - Right atrium: Central venous pressure (est): 3 mm Hg. - Atrial septum: There was increased thickness of the septum, consistent with lipomatous hypertrophy. - Pulmonary arteries: Systolic pressure could not be accurately estimated. - Pericardium, extracardiac: There was no pericardial effusion.  Impressions:  - Normal LV wall thickness with LVEF 55-60%, mild mid to apical anteroseptal hypokinesis, and grade 1 diastolic dysfunction. There has been improvement in LVEF  compared to the prior study from December 2015. Mild left atrial enlargement. Unable to assess PASP.     Assessment and Plan  1. CAD/ICM - hx of anterior STEMI with DES to LAD, continue DAPT at least until 04/2015. - LVEF initially 40% but has since improved to 55-60% - side effects on higher doses of coreg - no current symptoms, continue current meds  2. HTN - at goal, continue current meds   F/u 04/2015   Arnoldo Lenis, M.D.

## 2015-01-02 ENCOUNTER — Ambulatory Visit (INDEPENDENT_AMBULATORY_CARE_PROVIDER_SITE_OTHER): Payer: Medicare Other | Admitting: Cardiology

## 2015-01-02 ENCOUNTER — Encounter: Payer: Self-pay | Admitting: Cardiology

## 2015-01-02 VITALS — BP 110/70 | HR 82 | Ht 64.0 in | Wt 189.0 lb

## 2015-01-02 DIAGNOSIS — I10 Essential (primary) hypertension: Secondary | ICD-10-CM | POA: Diagnosis not present

## 2015-01-02 DIAGNOSIS — I2510 Atherosclerotic heart disease of native coronary artery without angina pectoris: Secondary | ICD-10-CM

## 2015-01-02 NOTE — Patient Instructions (Signed)
Your physician wants you to follow-up in: 4 months with Dr. Branch  You will receive a reminder letter in the mail two months in advance. If you don't receive a letter, please call our office to schedule the follow-up appointment.  Your physician recommends that you continue on your current medications as directed. Please refer to the Current Medication list given to you today.  Thank you for choosing [facility]!!    

## 2015-03-22 ENCOUNTER — Emergency Department (HOSPITAL_COMMUNITY): Payer: No Typology Code available for payment source

## 2015-03-22 ENCOUNTER — Encounter (HOSPITAL_COMMUNITY): Payer: Self-pay

## 2015-03-22 ENCOUNTER — Emergency Department (HOSPITAL_COMMUNITY)
Admission: EM | Admit: 2015-03-22 | Discharge: 2015-03-22 | Disposition: A | Discharge status: Home / Self Care | Payer: No Typology Code available for payment source | Attending: Emergency Medicine | Admitting: Emergency Medicine

## 2015-03-22 ENCOUNTER — Other Ambulatory Visit: Payer: Self-pay

## 2015-03-22 DIAGNOSIS — Z9889 Other specified postprocedural states: Secondary | ICD-10-CM | POA: Insufficient documentation

## 2015-03-22 DIAGNOSIS — Y9389 Activity, other specified: Secondary | ICD-10-CM | POA: Insufficient documentation

## 2015-03-22 DIAGNOSIS — E119 Type 2 diabetes mellitus without complications: Secondary | ICD-10-CM | POA: Diagnosis not present

## 2015-03-22 DIAGNOSIS — M546 Pain in thoracic spine: Secondary | ICD-10-CM

## 2015-03-22 DIAGNOSIS — Y998 Other external cause status: Secondary | ICD-10-CM | POA: Diagnosis not present

## 2015-03-22 DIAGNOSIS — Z7982 Long term (current) use of aspirin: Secondary | ICD-10-CM | POA: Diagnosis not present

## 2015-03-22 DIAGNOSIS — S29001A Unspecified injury of muscle and tendon of front wall of thorax, initial encounter: Secondary | ICD-10-CM | POA: Insufficient documentation

## 2015-03-22 DIAGNOSIS — S29002A Unspecified injury of muscle and tendon of back wall of thorax, initial encounter: Secondary | ICD-10-CM | POA: Diagnosis not present

## 2015-03-22 DIAGNOSIS — M542 Cervicalgia: Secondary | ICD-10-CM

## 2015-03-22 DIAGNOSIS — I252 Old myocardial infarction: Secondary | ICD-10-CM | POA: Insufficient documentation

## 2015-03-22 DIAGNOSIS — Z79899 Other long term (current) drug therapy: Secondary | ICD-10-CM | POA: Diagnosis not present

## 2015-03-22 DIAGNOSIS — I10 Essential (primary) hypertension: Secondary | ICD-10-CM | POA: Diagnosis not present

## 2015-03-22 DIAGNOSIS — Z87891 Personal history of nicotine dependence: Secondary | ICD-10-CM | POA: Insufficient documentation

## 2015-03-22 DIAGNOSIS — I2510 Atherosclerotic heart disease of native coronary artery without angina pectoris: Secondary | ICD-10-CM | POA: Insufficient documentation

## 2015-03-22 DIAGNOSIS — Y92410 Unspecified street and highway as the place of occurrence of the external cause: Secondary | ICD-10-CM | POA: Diagnosis not present

## 2015-03-22 DIAGNOSIS — M1990 Unspecified osteoarthritis, unspecified site: Secondary | ICD-10-CM | POA: Diagnosis not present

## 2015-03-22 DIAGNOSIS — S199XXA Unspecified injury of neck, initial encounter: Secondary | ICD-10-CM | POA: Diagnosis not present

## 2015-03-22 LAB — CBC WITH DIFFERENTIAL/PLATELET
BLASTS: 0 %
Band Neutrophils: 0 %
Basophils Absolute: 0.1 10*3/uL (ref 0.0–0.1)
Basophils Relative: 1 %
Eosinophils Absolute: 0.2 10*3/uL (ref 0.0–0.7)
Eosinophils Relative: 2 %
HEMATOCRIT: 39.2 % (ref 36.0–46.0)
HEMOGLOBIN: 13.8 g/dL (ref 12.0–15.0)
Lymphocytes Relative: 38 %
Lymphs Abs: 4.5 10*3/uL — ABNORMAL HIGH (ref 0.7–4.0)
MCH: 31.4 pg (ref 26.0–34.0)
MCHC: 35.2 g/dL (ref 30.0–36.0)
MCV: 89.1 fL (ref 78.0–100.0)
MONOS PCT: 2 %
Metamyelocytes Relative: 0 %
Monocytes Absolute: 0.2 10*3/uL (ref 0.1–1.0)
Myelocytes: 0 %
NEUTROS PCT: 57 %
Neutro Abs: 6.8 10*3/uL (ref 1.7–7.7)
Other: 0 %
PROMYELOCYTES ABS: 0 %
Platelets: 259 10*3/uL (ref 150–400)
RBC: 4.4 MIL/uL (ref 3.87–5.11)
RDW: 12.9 % (ref 11.5–15.5)
WBC: 11.8 10*3/uL — ABNORMAL HIGH (ref 4.0–10.5)
nRBC: 0 /100 WBC

## 2015-03-22 LAB — BASIC METABOLIC PANEL
Anion gap: 9 (ref 5–15)
BUN: 13 mg/dL (ref 6–20)
CHLORIDE: 109 mmol/L (ref 101–111)
CO2: 22 mmol/L (ref 22–32)
CREATININE: 0.79 mg/dL (ref 0.44–1.00)
Calcium: 9.2 mg/dL (ref 8.9–10.3)
GFR calc Af Amer: >60 mL/min (ref >60)
GFR calc non Af Amer: >60 mL/min (ref >60)
GLUCOSE: 105 mg/dL — AB (ref 65–99)
Potassium: 3.8 mmol/L (ref 3.5–5.1)
Sodium: 140 mmol/L (ref 135–145)

## 2015-03-22 LAB — TROPONIN I: Troponin I: <0.03 ng/mL (ref <0.031)

## 2015-03-22 MED ORDER — FENTANYL CITRATE (PF) 100 MCG/2ML IJ SOLN
50.0000 ug | INTRAMUSCULAR | Status: DC | PRN
  Filled 2015-03-22: qty 2

## 2015-03-22 MED ORDER — CYCLOBENZAPRINE HCL 10 MG PO TABS
10.0000 mg | ORAL_TABLET | Freq: Once | ORAL | Status: AC
  Administered 2015-03-22: 10 mg via ORAL
  Filled 2015-03-22: qty 1

## 2015-03-22 MED ORDER — CYCLOBENZAPRINE HCL 10 MG PO TABS: 10.0000 mg | ORAL_TABLET | Freq: Two times a day (BID) | ORAL | Status: DC | PRN
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
## 2015-03-22 NOTE — Discharge Instructions (Signed)
If you were given medicines take as directed.  If you are on coumadin or contraceptives realize their levels and effectiveness is altered by many different medicines.  If you have any reaction (rash, tongues swelling, other) to the medicines stop taking and see a physician.    If your blood pressure was elevated in the ER make sure you follow up for management with a primary doctor or return for chest pain, shortness of breath or stroke symptoms.  Please follow up as directed and return to the ER or see a physician for new or worsening symptoms.  Thank you. Filed Vitals:   03/22/15 1759  BP: 156/79  Pulse: 82  Temp: 97.7 F (36.5 C)  TempSrc: Oral  Height: 5\' 4"  (1.626 m)  Weight: 185 lb (83.915 kg)  SpO2: 100%
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
## 2015-03-22 NOTE — ED Notes (Signed)
Patient transported to CT and XR 

## 2015-05-01 ENCOUNTER — Encounter: Payer: Self-pay | Admitting: Cardiology

## 2015-05-01 ENCOUNTER — Ambulatory Visit (INDEPENDENT_AMBULATORY_CARE_PROVIDER_SITE_OTHER): Payer: Medicare Other | Admitting: Cardiology

## 2015-05-01 VITALS — BP 125/80 | HR 91 | Ht 64.0 in | Wt 193.0 lb

## 2015-05-01 DIAGNOSIS — I2510 Atherosclerotic heart disease of native coronary artery without angina pectoris: Secondary | ICD-10-CM | POA: Diagnosis not present

## 2015-05-01 DIAGNOSIS — I10 Essential (primary) hypertension: Secondary | ICD-10-CM

## 2015-05-01 MED ORDER — FUROSEMIDE 20 MG PO TABS: 20.0000 mg | ORAL_TABLET | ORAL | Status: DC | PRN

## 2015-05-01 MED ORDER — POTASSIUM CHLORIDE CRYS ER 20 MEQ PO TBCR: 20.0000 meq | EXTENDED_RELEASE_TABLET | ORAL | Status: DC

## 2015-05-01 NOTE — Progress Notes (Signed)
Patient ID: Casey Avila, female   DOB: 06-Nov-1945, 69 y.o.   MRN: [id number]     Clinical Summary Casey Avila is a 69 y.o.female seen today for follow up of the following medical problems.   1. CAD/ICM - admitted with anterior STEMI 05/02/14, s/p DES to LAD. LVEF 40% by echo at that time. Repeat echo 07/2014 shows LVEF 55-60%.  - denies any recent chest pain  2. HTN - bp is 110s/70s at home - compliant with meds.   3. LE edema - has noticed some increased SOB, some weight gain at times. She has not been using her prn lasix.   Past Medical History  Diagnosis Date  . Aneurysm ([place])     brain aneurysms x 2 x several yrs  . ST elevation (STEMI) myocardial infarction involving left anterior descending coronary artery ([place]) 05/02/2014  . Ischemic cardiomyopathy 05/02/2014  . HTN (hypertension)   . Coronary artery disease   . Diabetes mellitus without complication ([place])   . DDD (degenerative disc disease), cervical   . Osteoarthritis   . Ulcerative colitis ([place])      Allergies  Allergen Reactions  . Clindamycin/Lincomycin Itching and Rash    Had to go into ER   . Bactrim [Sulfamethoxazole-Trimethoprim] Rash  . Lipitor [Atorvastatin] Other (See Comments)    Aching but still takes per order from Cardiologist   . Morphine And Related     Makes me sick      Current Outpatient Prescriptions  Medication Sig Dispense Refill  . ALPRAZolam (XANAX) 0.5 MG tablet Take 0.5 mg by mouth 2 (two) times daily as needed for sleep.    Marland Kitchen aspirin 81 MG chewable tablet Chew 1 tablet (81 mg total) by mouth daily.    Marland Kitchen atorvastatin (LIPITOR) 80 MG tablet Take 1 tablet (80 mg total) by mouth daily. 90 tablet 3  . BRILINTA 90 MG TABS tablet TAKE 1 TABLET BY MOUTH TWICE A DAY 60 tablet 6  . carvedilol (COREG) 6.25 MG tablet Take 6.25 mg by mouth 2 (two) times daily with a meal.    . cyclobenzaprine (FLEXERIL) 10 MG tablet Take 1 tablet (10 mg total) by mouth 2 (two) times daily as needed for  muscle spasms. 5 tablet 0  . enalapril (VASOTEC) 5 MG tablet Take 1 tablet (5 mg total) by mouth 2 (two) times daily. 180 tablet 3  . esomeprazole (NEXIUM) 40 MG capsule Take 40 mg by mouth daily at 12 noon.    . furosemide (LASIX) 20 MG tablet Take 1 tablet (20 mg total) by mouth as needed for edema (swelling or weight gain). 90 tablet 0  . HYDROcodone-acetaminophen (NORCO/VICODIN) 5-325 MG per tablet Take 1 tablet by mouth every 6 (six) hours as needed for moderate pain.    Marland Kitchen spironolactone (ALDACTONE) 25 MG tablet Take 0.5 tablets (12.5 mg total) by mouth daily. 45 tablet 3  . topiramate (TOPAMAX) 100 MG tablet Take 100 mg by mouth daily.    Marland Kitchen venlafaxine (EFFEXOR) 75 MG tablet Take 75 mg by mouth at bedtime.      No current facility-administered medications for this visit.     Past Surgical History  Procedure Laterality Date  . Neck surgery    . Left heart catheterization with coronary angiogram N/A 05/02/2014    Procedure: LEFT HEART CATHETERIZATION WITH CORONARY ANGIOGRAM;  Surgeon: Jettie Booze, MD;  Location: [facility] CATH LAB;  Service: Cardiovascular;  Laterality: N/A;  . Cardiac catheterization    . Ptca    .  Abdominal hysterectomy    . Bladder removal       Allergies  Allergen Reactions  . Clindamycin/Lincomycin Itching and Rash    Had to go into ER   . Bactrim [Sulfamethoxazole-Trimethoprim] Rash  . Lipitor [Atorvastatin] Other (See Comments)    Aching but still takes per order from Cardiologist   . Morphine And Related     Makes me sick       Family History  Problem Relation Age of Onset  . Anuerysm Mother   . Alcoholism Father   . Anuerysm Other      Social History Ms. Crunkleton reports that she quit smoking about a year ago. Her smoking use included Cigarettes. She started smoking about 49 years ago. She has a 72 pack-year smoking history. She has never used smokeless tobacco. Ms. Katayama reports that she does not drink alcohol.   Review of  Systems CONSTITUTIONAL: No weight loss, fever, chills, weakness or fatigue.  HEENT: Eyes: No visual loss, blurred vision, double vision or yellow sclerae.No hearing loss, sneezing, congestion, runny nose or sore throat.  SKIN: No rash or itching.  CARDIOVASCULAR: per hpi RESPIRATORY: No shortness of breath, cough or sputum.  GASTROINTESTINAL: No anorexia, nausea, vomiting or diarrhea. No abdominal pain or blood.  GENITOURINARY: No burning on urination, no polyuria NEUROLOGICAL: No headache, dizziness, syncope, paralysis, ataxia, numbness or tingling in the extremities. No change in bowel or bladder control.  MUSCULOSKELETAL: No muscle, back pain, joint pain or stiffness.  LYMPHATICS: No enlarged nodes. No history of splenectomy.  PSYCHIATRIC: No history of depression or anxiety.  ENDOCRINOLOGIC: No reports of sweating, cold or heat intolerance. No polyuria or polydipsia.  Marland Kitchen   Physical Examination Filed Vitals:   05/01/15 1437  BP: 125/80  Pulse: 91   Filed Vitals:   05/01/15 1437  Height: 5\' 4"  (1.626 m)  Weight: 193 lb (87.544 kg)    Gen: resting comfortably, no acute distress HEENT: no scleral icterus, pupils equal round and reactive, no palptable cervical adenopathy,  CV: RRR, no mr/g, no jvd Resp: Clear to auscultation bilaterally GI: abdomen is soft, non-tender, non-distended, normal bowel sounds, no hepatosplenomegaly MSK: extremities are warm, no edema.  Skin: warm, no rash Neuro:  no focal deficits Psych: appropriate affect   Diagnostic Studies  04/2014 Echo Study Conclusions  - Left ventricle: Can not rule out apical clot. Severe hypokinesis mid/apical inferior segments, mid/apical anterior segments, mid anteroseptal segment. The cavity size was normal. Wall thickness was increased in a pattern of mild LVH. The estimated ejection fraction was 40%. Doppler parameters are consistent with abnormal left ventricular relaxation (grade 1 diastolic  dysfunction). - Right ventricle: The cavity size was normal. Systolic function was normal.  04/2014 Cath  HEMODYNAMICS: Aortic pressure was 132/67; LV pressure was 136/19; LVEDP 34. There was no gradient between the left ventricle and aorta.   ANGIOGRAPHIC DATA: The left main coronary artery is widely patent.  The left anterior descending artery is a large vessel. The vessel is occluded in the midportion. After intervention, it was noted that there was disease just past the occlusion. The remainder of the mid to distal LAD was widely patent. There were several small diagonals which were patent.  The left circumflex artery is a large vessel with mild irregularities proximally. There is a large first obtuse marginal which is widely patent. The second obtuse marginal is large and widely patent.  The right coronary artery is a large dominant vessel. In the mid vessel, there is  an eccentric 40% stenosis. The posterior descending artery is large and widely patent. The posterior lateral artery is large and widely patent.  LEFT VENTRICULOGRAM: Left ventricular angiogram was not done due to elevated LVEDP. LVEDP was 34 mmHg.  PCI NARRATIVE: A CLS 3.0 guiding catheter was used to engage the left main. A pro-water wire was placed across the area disease in the LAD. A fetch thrombectomy catheter was used to perform aspiration thrombectomy. There was significant thrombus removal. A second pass was performed with even more thrombus removal. TIMI-3 flow was restored after the first pass. There was a residual mid vessel lesion which was about 12 mm in length, up to 80%. Just past this, there was another segment of moderate disease. The more proximal area was ballooned with a 2.5 x 12 balloon. The entire area was stented with a 2.75 x 28 Promus drug-eluting stent, postdilated to greater than 3 mm in diameter. There was some sluggish flow in the very apical LAD. Multiple doses of IC adenosine were given.  TIMI-3 flow was present at the end of the procedure. The patient was pain-free.  IMPRESSIONS:  1. Normal left main coronary artery. 2. Occluded mid left anterior descending artery which was the culprit for today's presentation. This was successfully treated with a 2.75 x 28 Promus drug-eluting stent, postdilated to greater than 3 mm in diameter. 3. Mild disease in the left circumflex artery and its branches. 4. Mild to moderate disease in the right coronary artery. 5. Left ventricular systolic function was not assessed. LVEDP 34 mmHg.  RECOMMENDATION: We'll watch her in the ICU. Start low-dose beta blocker. Start high-dose statin. She needs aggressive secondary prevention. Of note, during the case, we checked about the safety of dual antiplatelet therapy with Dr. Ellene Route, as the patient has small brain aneurysms per her report. She was thought to be appropriate for prolonged dual antiplatelet therapy. She will need dual antiplatelet therapy for at least a year. Check echocardiogram to evaluate LV function.    07/2014 Echo Study Conclusions  - Left ventricle: The cavity size was normal. Wall thickness was normal. Systolic function was normal. The estimated ejection fraction was in the range of 55% to 60%. There is mild hypokinesis of the mid-apicalanteroseptal myocardium. Doppler parameters are consistent with abnormal left ventricular relaxation (grade 1 diastolic dysfunction). Doppler parameters are consistent with elevated ventricular end-diastolic filling pressure. - Aortic valve: Mildly calcified annulus. Trileaflet. - Left atrium: The atrium was mildly dilated. - Right atrium: Central venous pressure (est): 3 mm Hg. - Atrial septum: There was increased thickness of the septum, consistent with lipomatous hypertrophy. - Pulmonary arteries: Systolic pressure could not be accurately estimated. - Pericardium, extracardiac: There was no pericardial  effusion.  Impressions:  - Normal LV wall thickness with LVEF 55-60%, mild mid to apical anteroseptal hypokinesis, and grade 1 diastolic dysfunction. There has been improvement in LVEF compared to the prior study from December 2015. Mild left atrial enlargement. Unable to assess PASP.      Assessment and Plan   1. CAD/ICM - hx of anterior STEMI with DES to LAD. We will d/c brillinta later this month as she has completed a year.  - LVEF initially 40% but has since improved to 55-60% - continue other meds  2. HTN - at goal, continue current meds   F/u 6 months  Arnoldo Lenis, M.D.

## 2015-05-01 NOTE — Patient Instructions (Addendum)
   Stop your  Brilinta on Sunday, 05/03/2015.  (removed from medication list today)  Begin Potassium 66meq as needed.    New prescription on above & refill for Lasix sent to [facility] today. Continue all other medications.   Your physician wants you to follow up in: 6 months.  You will receive a reminder letter in the mail one-two months in advance.  If you don't receive a letter, please call our office to schedule the follow up appointment

## 2015-06-08 DIAGNOSIS — M5414 Radiculopathy, thoracic region: Secondary | ICD-10-CM | POA: Diagnosis not present

## 2015-06-08 DIAGNOSIS — M546 Pain in thoracic spine: Secondary | ICD-10-CM | POA: Diagnosis not present

## 2015-06-24 DIAGNOSIS — E1142 Type 2 diabetes mellitus with diabetic polyneuropathy: Secondary | ICD-10-CM | POA: Diagnosis not present

## 2015-06-24 DIAGNOSIS — E669 Obesity, unspecified: Secondary | ICD-10-CM | POA: Diagnosis not present

## 2015-06-24 DIAGNOSIS — Z6833 Body mass index (BMI) 33.0-33.9, adult: Secondary | ICD-10-CM | POA: Diagnosis not present

## 2015-06-24 DIAGNOSIS — Z87891 Personal history of nicotine dependence: Secondary | ICD-10-CM | POA: Diagnosis not present

## 2015-06-29 DIAGNOSIS — J029 Acute pharyngitis, unspecified: Secondary | ICD-10-CM | POA: Diagnosis not present

## 2015-06-29 DIAGNOSIS — J069 Acute upper respiratory infection, unspecified: Secondary | ICD-10-CM | POA: Diagnosis not present

## 2015-06-29 DIAGNOSIS — E7800 Pure hypercholesterolemia, unspecified: Secondary | ICD-10-CM | POA: Diagnosis not present

## 2015-06-29 DIAGNOSIS — B354 Tinea corporis: Secondary | ICD-10-CM | POA: Diagnosis not present

## 2015-06-30 DIAGNOSIS — M5414 Radiculopathy, thoracic region: Secondary | ICD-10-CM | POA: Diagnosis not present

## 2015-06-30 DIAGNOSIS — M546 Pain in thoracic spine: Secondary | ICD-10-CM | POA: Diagnosis not present

## 2015-07-10 DIAGNOSIS — M546 Pain in thoracic spine: Secondary | ICD-10-CM | POA: Diagnosis not present

## 2015-07-10 DIAGNOSIS — M5134 Other intervertebral disc degeneration, thoracic region: Secondary | ICD-10-CM | POA: Diagnosis not present

## 2015-07-10 DIAGNOSIS — M4724 Other spondylosis with radiculopathy, thoracic region: Secondary | ICD-10-CM | POA: Diagnosis not present

## 2015-07-10 DIAGNOSIS — M5414 Radiculopathy, thoracic region: Secondary | ICD-10-CM | POA: Diagnosis not present

## 2015-07-28 DIAGNOSIS — H9202 Otalgia, left ear: Secondary | ICD-10-CM | POA: Diagnosis not present

## 2015-07-28 DIAGNOSIS — Z6833 Body mass index (BMI) 33.0-33.9, adult: Secondary | ICD-10-CM | POA: Diagnosis not present

## 2015-07-28 DIAGNOSIS — M26602 Left temporomandibular joint disorder, unspecified: Secondary | ICD-10-CM | POA: Diagnosis not present

## 2015-07-28 DIAGNOSIS — M5414 Radiculopathy, thoracic region: Secondary | ICD-10-CM | POA: Diagnosis not present

## 2015-07-28 DIAGNOSIS — M5134 Other intervertebral disc degeneration, thoracic region: Secondary | ICD-10-CM | POA: Diagnosis not present

## 2015-07-28 DIAGNOSIS — H6123 Impacted cerumen, bilateral: Secondary | ICD-10-CM | POA: Diagnosis not present

## 2015-07-28 DIAGNOSIS — H9313 Tinnitus, bilateral: Secondary | ICD-10-CM | POA: Diagnosis not present

## 2015-07-28 DIAGNOSIS — M546 Pain in thoracic spine: Secondary | ICD-10-CM | POA: Diagnosis not present

## 2015-07-31 DIAGNOSIS — Z8679 Personal history of other diseases of the circulatory system: Secondary | ICD-10-CM | POA: Diagnosis not present

## 2015-07-31 DIAGNOSIS — M549 Dorsalgia, unspecified: Secondary | ICD-10-CM | POA: Diagnosis not present

## 2015-08-05 DIAGNOSIS — J449 Chronic obstructive pulmonary disease, unspecified: Secondary | ICD-10-CM | POA: Diagnosis not present

## 2015-08-05 DIAGNOSIS — I10 Essential (primary) hypertension: Secondary | ICD-10-CM | POA: Diagnosis not present

## 2015-08-05 DIAGNOSIS — E119 Type 2 diabetes mellitus without complications: Secondary | ICD-10-CM | POA: Diagnosis not present

## 2015-08-28 DIAGNOSIS — J449 Chronic obstructive pulmonary disease, unspecified: Secondary | ICD-10-CM | POA: Diagnosis not present

## 2015-08-28 DIAGNOSIS — E119 Type 2 diabetes mellitus without complications: Secondary | ICD-10-CM | POA: Diagnosis not present

## 2015-08-28 DIAGNOSIS — I10 Essential (primary) hypertension: Secondary | ICD-10-CM | POA: Diagnosis not present

## 2015-09-10 DIAGNOSIS — R319 Hematuria, unspecified: Secondary | ICD-10-CM | POA: Diagnosis not present

## 2015-09-10 DIAGNOSIS — E1165 Type 2 diabetes mellitus with hyperglycemia: Secondary | ICD-10-CM | POA: Diagnosis not present

## 2015-09-10 DIAGNOSIS — R350 Frequency of micturition: Secondary | ICD-10-CM | POA: Diagnosis not present

## 2015-09-10 DIAGNOSIS — R5383 Other fatigue: Secondary | ICD-10-CM | POA: Diagnosis not present

## 2015-09-10 DIAGNOSIS — R1031 Right lower quadrant pain: Secondary | ICD-10-CM | POA: Diagnosis not present

## 2015-09-10 DIAGNOSIS — E7800 Pure hypercholesterolemia, unspecified: Secondary | ICD-10-CM | POA: Diagnosis not present

## 2015-09-10 DIAGNOSIS — I10 Essential (primary) hypertension: Secondary | ICD-10-CM | POA: Diagnosis not present

## 2015-09-15 DIAGNOSIS — E7800 Pure hypercholesterolemia, unspecified: Secondary | ICD-10-CM | POA: Diagnosis not present

## 2015-09-15 DIAGNOSIS — R5383 Other fatigue: Secondary | ICD-10-CM | POA: Diagnosis not present

## 2015-09-15 DIAGNOSIS — R1031 Right lower quadrant pain: Secondary | ICD-10-CM | POA: Diagnosis not present

## 2015-09-16 DIAGNOSIS — R300 Dysuria: Secondary | ICD-10-CM | POA: Diagnosis not present

## 2015-09-16 DIAGNOSIS — Z299 Encounter for prophylactic measures, unspecified: Secondary | ICD-10-CM | POA: Diagnosis not present

## 2015-09-16 DIAGNOSIS — J449 Chronic obstructive pulmonary disease, unspecified: Secondary | ICD-10-CM | POA: Diagnosis not present

## 2015-09-16 DIAGNOSIS — E7800 Pure hypercholesterolemia, unspecified: Secondary | ICD-10-CM | POA: Diagnosis not present

## 2015-10-01 DIAGNOSIS — Z419 Encounter for procedure for purposes other than remedying health state, unspecified: Secondary | ICD-10-CM | POA: Diagnosis not present

## 2015-10-01 DIAGNOSIS — L720 Epidermal cyst: Secondary | ICD-10-CM | POA: Diagnosis not present

## 2015-10-01 DIAGNOSIS — L570 Actinic keratosis: Secondary | ICD-10-CM | POA: Diagnosis not present

## 2015-10-01 DIAGNOSIS — L821 Other seborrheic keratosis: Secondary | ICD-10-CM | POA: Diagnosis not present

## 2015-10-15 DIAGNOSIS — J449 Chronic obstructive pulmonary disease, unspecified: Secondary | ICD-10-CM | POA: Diagnosis not present

## 2015-10-15 DIAGNOSIS — I10 Essential (primary) hypertension: Secondary | ICD-10-CM | POA: Diagnosis not present

## 2015-10-15 DIAGNOSIS — E119 Type 2 diabetes mellitus without complications: Secondary | ICD-10-CM | POA: Diagnosis not present

## 2015-10-22 DIAGNOSIS — M5414 Radiculopathy, thoracic region: Secondary | ICD-10-CM | POA: Diagnosis not present

## 2015-10-23 DIAGNOSIS — J449 Chronic obstructive pulmonary disease, unspecified: Secondary | ICD-10-CM | POA: Diagnosis not present

## 2015-10-23 DIAGNOSIS — I10 Essential (primary) hypertension: Secondary | ICD-10-CM | POA: Diagnosis not present

## 2015-10-23 DIAGNOSIS — E119 Type 2 diabetes mellitus without complications: Secondary | ICD-10-CM | POA: Diagnosis not present

## 2015-11-13 ENCOUNTER — Encounter: Payer: Self-pay | Admitting: *Deleted

## 2015-11-13 ENCOUNTER — Ambulatory Visit (INDEPENDENT_AMBULATORY_CARE_PROVIDER_SITE_OTHER): Payer: Medicare Other | Admitting: Cardiology

## 2015-11-13 ENCOUNTER — Encounter: Payer: Self-pay | Admitting: Cardiology

## 2015-11-13 VITALS — BP 120/79 | HR 95 | Ht 64.0 in | Wt 195.0 lb

## 2015-11-13 DIAGNOSIS — R079 Chest pain, unspecified: Secondary | ICD-10-CM

## 2015-11-13 DIAGNOSIS — I2510 Atherosclerotic heart disease of native coronary artery without angina pectoris: Secondary | ICD-10-CM

## 2015-11-13 DIAGNOSIS — I255 Ischemic cardiomyopathy: Secondary | ICD-10-CM

## 2015-11-13 DIAGNOSIS — I10 Essential (primary) hypertension: Secondary | ICD-10-CM

## 2015-11-13 MED ORDER — CARVEDILOL 6.25 MG PO TABS: 6.2500 mg | ORAL_TABLET | Freq: Two times a day (BID) | ORAL | Status: DC

## 2015-11-13 MED ORDER — ATORVASTATIN CALCIUM 80 MG PO TABS: 80.0000 mg | ORAL_TABLET | Freq: Every day | ORAL | Status: DC

## 2015-11-13 MED ORDER — ENALAPRIL MALEATE 5 MG PO TABS: 5.0000 mg | ORAL_TABLET | Freq: Two times a day (BID) | ORAL | Status: DC

## 2015-11-13 MED ORDER — SPIRONOLACTONE 25 MG PO TABS: 12.5000 mg | ORAL_TABLET | Freq: Every day | ORAL | Status: DC

## 2015-11-13 MED ORDER — NITROGLYCERIN 0.4 MG SL SUBL: 0.4000 mg | SUBLINGUAL_TABLET | SUBLINGUAL | Status: DC | PRN

## 2015-11-13 NOTE — Patient Instructions (Signed)
Your physician recommends that you schedule a follow-up appointment in: TO BE DETERMINED AFTER TESTING  Your physician has recommended you make the following change in your medication:   START NITROGLYCERIN AS NEEDED FOR CHEST PAIN   Your physician has requested that you have en exercise stress myoview. For further information please visit [email or web address]. Please follow instruction sheet, as given.    Nitroglycerin sublingual tablets What is this medicine? NITROGLYCERIN (nye troe GLI ser in) is a type of vasodilator. It relaxes blood vessels, increasing the blood and oxygen supply to your heart. This medicine is used to relieve chest pain caused by angina. It is also used to prevent chest pain before activities like climbing stairs, going outdoors in cold weather, or sexual activity. This medicine may be used for other purposes; ask your health care provider or pharmacist if you have questions. What should I tell my health care provider before I take this medicine? They need to know if you have any of these conditions: -anemia -head injury, recent stroke, or bleeding in the brain -liver disease -previous heart attack -an unusual or allergic reaction to nitroglycerin, other medicines, foods, dyes, or preservatives -pregnant or trying to get pregnant -breast-feeding How should I use this medicine? Take this medicine by mouth as needed. At the first sign of an angina attack (chest pain or tightness) place one tablet under your tongue. You can also take this medicine 5 to 10 minutes before an event likely to produce chest pain. Follow the directions on the prescription label. Let the tablet dissolve under the tongue. Do not swallow whole. Replace the dose if you accidentally swallow it. It will help if your mouth is not dry. Saliva around the tablet will help it to dissolve more quickly. Do not eat or drink, smoke or chew tobacco while a tablet is dissolving. If you are not better within 5  minutes after taking ONE dose of nitroglycerin, call 9-1-1 immediately to seek emergency medical care. Do not take more than 3 nitroglycerin tablets over 15 minutes. If you take this medicine often to relieve symptoms of angina, your doctor or health care professional may provide you with different instructions to manage your symptoms. If symptoms do not go away after following these instructions, it is important to call 9-1-1 immediately. Do not take more than 3 nitroglycerin tablets over 15 minutes. Talk to your pediatrician regarding the use of this medicine in children. Special care may be needed. Overdosage: If you think you have taken too much of this medicine contact a poison control center or emergency room at once. NOTE: This medicine is only for you. Do not share this medicine with others. What if I miss a dose? This does not apply. This medicine is only used as needed. What may interact with this medicine? Do not take this medicine with any of the following medications: -certain migraine medicines like ergotamine and dihydroergotamine (DHE) -medicines used to treat erectile dysfunction like sildenafil, tadalafil, and vardenafil -riociguat This medicine may also interact with the following medications: -alteplase -aspirin -heparin -medicines for high blood pressure -medicines for mental depression -other medicines used to treat angina -phenothiazines like chlorpromazine, mesoridazine, prochlorperazine, thioridazine This list may not describe all possible interactions. Give your health care provider a list of all the medicines, herbs, non-prescription drugs, or dietary supplements you use. Also tell them if you smoke, drink alcohol, or use illegal drugs. Some items may interact with your medicine. What should I watch for while using this medicine? Tell  your doctor or health care professional if you feel your medicine is no longer working. Keep this medicine with you at all times. Sit  or lie down when you take your medicine to prevent falling if you feel dizzy or faint after using it. Try to remain calm. This will help you to feel better faster. If you feel dizzy, take several deep breaths and lie down with your feet propped up, or bend forward with your head resting between your knees. You may get drowsy or dizzy. Do not drive, use machinery, or do anything that needs mental alertness until you know how this drug affects you. Do not stand or sit up quickly, especially if you are an older patient. This reduces the risk of dizzy or fainting spells. Alcohol can make you more drowsy and dizzy. Avoid alcoholic drinks. Do not treat yourself for coughs, colds, or pain while you are taking this medicine without asking your doctor or health care professional for advice. Some ingredients may increase your blood pressure. What side effects may I notice from receiving this medicine? Side effects that you should report to your doctor or health care professional as soon as possible: -blurred vision -dry mouth -skin rash -sweating -the feeling of extreme pressure in the head -unusually weak or tired Side effects that usually do not require medical attention (report to your doctor or health care professional if they continue or are bothersome): -flushing of the face or neck -headache -irregular heartbeat, palpitations -nausea, vomiting This list may not describe all possible side effects. Call your doctor for medical advice about side effects. You may report side effects to FDA at 1-800-FDA-1088. Where should I keep my medicine? Keep out of the reach of children. Store at room temperature between 20 and 25 degrees C (68 and 77 degrees F). Store in [facility] [employer]. Protect from light and moisture. Keep tightly closed. Throw away any unused medicine after the expiration date. NOTE: This sheet is a summary. It may not cover all possible information. If you have questions about this medicine,  talk to your doctor, pharmacist, or health care provider.    2016, [employer]vier/[employer] Standard. (2013-03-07 17:57:36)

## 2015-11-13 NOTE — Progress Notes (Signed)
Clinical Summary Casey Avila is a 70 y.o.female seen today for follow up of the following medical problems.   1. CAD/ICM - admitted with anterior STEMI 05/02/14, s/p DES to LAD. LVEF 40% by echo at that time. Repeat echo 07/2014 shows LVEF 55-60%.  - recent episodes of diaphoresis that occurs with activity. +SOB, no chest pain. No palpitations. Stops and rests, symptoms improve.  - started about 1 month. Approx 3-4 episodes since that time.  - compliant with meds  2. HTN - bp is 110s/70s at home. Can have occasional low bp's.  - compliant with meds.   3. LE edema - compliant with prn lasix.    Past Medical History  Diagnosis Date  . Aneurysm ([place])     brain aneurysms x 2 x several yrs  . ST elevation (STEMI) myocardial infarction involving left anterior descending coronary artery ([place]) 05/02/2014  . Ischemic cardiomyopathy 05/02/2014  . HTN (hypertension)   . Coronary artery disease   . Diabetes mellitus without complication ([place])   . DDD (degenerative disc disease), cervical   . Osteoarthritis   . Ulcerative colitis ([place])      Allergies  Allergen Reactions  . Clindamycin/Lincomycin Itching and Rash    Had to go into ER   . Bactrim [Sulfamethoxazole-Trimethoprim] Rash  . Lipitor [Atorvastatin] Other (See Comments)    Aching but still takes per order from Cardiologist   . Morphine And Related     Makes me sick      Current Outpatient Prescriptions  Medication Sig Dispense Refill  . ALPRAZolam (XANAX) 0.5 MG tablet Take 0.5 mg by mouth 2 (two) times daily as needed for sleep.    Marland Kitchen aspirin 81 MG chewable tablet Chew 1 tablet (81 mg total) by mouth daily.    Marland Kitchen atorvastatin (LIPITOR) 80 MG tablet Take 1 tablet (80 mg total) by mouth daily. 90 tablet 3  . carvedilol (COREG) 6.25 MG tablet Take 6.25 mg by mouth 2 (two) times daily with a meal.    . enalapril (VASOTEC) 5 MG tablet Take 1 tablet (5 mg total) by mouth 2 (two) times daily. 180 tablet 3  . esomeprazole  (NEXIUM) 40 MG capsule Take 40 mg by mouth daily at 12 noon.    . furosemide (LASIX) 20 MG tablet Take 1 tablet (20 mg total) by mouth as needed for edema (swelling or weight gain). 90 tablet 0  . HYDROcodone-acetaminophen (NORCO/VICODIN) 5-325 MG per tablet Take 1 tablet by mouth every 6 (six) hours as needed for moderate pain.    . potassium chloride SA (K-DUR,KLOR-CON) 20 MEQ tablet Take 1 tablet (20 mEq total) by mouth as directed. On the days that you take your as needed Lasix 90 tablet 0  . spironolactone (ALDACTONE) 25 MG tablet Take 0.5 tablets (12.5 mg total) by mouth daily. 45 tablet 3  . topiramate (TOPAMAX) 100 MG tablet Take 100 mg by mouth daily.    Marland Kitchen venlafaxine (EFFEXOR) 75 MG tablet Take 75 mg by mouth at bedtime.      No current facility-administered medications for this visit.     Past Surgical History  Procedure Laterality Date  . Neck surgery    . Left heart catheterization with coronary angiogram N/A 05/02/2014    Procedure: LEFT HEART CATHETERIZATION WITH CORONARY ANGIOGRAM;  Surgeon: Jettie Booze, MD;  Location: [facility] CATH LAB;  Service: Cardiovascular;  Laterality: N/A;  . Cardiac catheterization    . Ptca    . Abdominal hysterectomy    .  Bladder removal       Allergies  Allergen Reactions  . Clindamycin/Lincomycin Itching and Rash    Had to go into ER   . Bactrim [Sulfamethoxazole-Trimethoprim] Rash  . Lipitor [Atorvastatin] Other (See Comments)    Aching but still takes per order from Cardiologist   . Morphine And Related     Makes me sick       Family History  Problem Relation Age of Onset  . Anuerysm Mother   . Alcoholism Father   . Anuerysm Other      Social History Ms. Tankard reports that she quit smoking about 18 months ago. Her smoking use included Cigarettes. She started smoking about 49 years ago. She has a 72 pack-year smoking history. She has never used smokeless tobacco. Ms. Wayson reports that she does not drink  alcohol.   Review of Systems CONSTITUTIONAL: No weight loss, fever, chills, weakness or fatigue.  HEENT: Eyes: No visual loss, blurred vision, double vision or yellow sclerae.No hearing loss, sneezing, congestion, runny nose or sore throat.  SKIN: No rash or itching.  CARDIOVASCULAR: per HPI RESPIRATORY: +SOB GASTROINTESTINAL: No anorexia, nausea, vomiting or diarrhea. No abdominal pain or blood.  GENITOURINARY: No burning on urination, no polyuria NEUROLOGICAL: No headache, dizziness, syncope, paralysis, ataxia, numbness or tingling in the extremities. No change in bowel or bladder control.  MUSCULOSKELETAL: No muscle, back pain, joint pain or stiffness.  LYMPHATICS: No enlarged nodes. No history of splenectomy.  PSYCHIATRIC: No history of depression or anxiety.  ENDOCRINOLOGIC: No reports of sweating, cold or heat intolerance. No polyuria or polydipsia.  Marland Kitchen   Physical Examination Filed Vitals:   11/13/15 1407  BP: 120/79  Pulse: 95   Filed Vitals:   11/13/15 1407  Height: 5\' 4"  (1.626 m)  Weight: 195 lb (88.451 kg)    Gen: resting comfortably, no acute distress HEENT: no scleral icterus, pupils equal round and reactive, no palptable cervical adenopathy,  CV: RRR, 2/6 systolic murmur RUSB, no jvd Resp: Clear to auscultation bilaterally GI: abdomen is soft, non-tender, non-distended, normal bowel sounds, no hepatosplenomegaly MSK: extremities are warm, no edema.  Skin: warm, no rash Neuro:  no focal deficits Psych: appropriate affect   Diagnostic Studies 04/2014 Echo Study Conclusions  - Left ventricle: Can not rule out apical clot. Severe hypokinesis mid/apical inferior segments, mid/apical anterior segments, mid anteroseptal segment. The cavity size was normal. Wall thickness was increased in a pattern of mild LVH. The estimated ejection fraction was 40%. Doppler parameters are consistent with abnormal left ventricular relaxation (grade 1 diastolic  dysfunction). - Right ventricle: The cavity size was normal. Systolic function was normal.  04/2014 Cath  HEMODYNAMICS: Aortic pressure was 132/67; LV pressure was 136/19; LVEDP 34. There was no gradient between the left ventricle and aorta.   ANGIOGRAPHIC DATA: The left main coronary artery is widely patent.  The left anterior descending artery is a large vessel. The vessel is occluded in the midportion. After intervention, it was noted that there was disease just past the occlusion. The remainder of the mid to distal LAD was widely patent. There were several small diagonals which were patent.  The left circumflex artery is a large vessel with mild irregularities proximally. There is a large first obtuse marginal which is widely patent. The second obtuse marginal is large and widely patent.  The right coronary artery is a large dominant vessel. In the mid vessel, there is an eccentric 40% stenosis. The posterior descending artery is large and widely  patent. The posterior lateral artery is large and widely patent.  LEFT VENTRICULOGRAM: Left ventricular angiogram was not done due to elevated LVEDP. LVEDP was 34 mmHg.  PCI NARRATIVE: A CLS 3.0 guiding catheter was used to engage the left main. A pro-water wire was placed across the area disease in the LAD. A fetch thrombectomy catheter was used to perform aspiration thrombectomy. There was significant thrombus removal. A second pass was performed with even more thrombus removal. TIMI-3 flow was restored after the first pass. There was a residual mid vessel lesion which was about 12 mm in length, up to 80%. Just past this, there was another segment of moderate disease. The more proximal area was ballooned with a 2.5 x 12 balloon. The entire area was stented with a 2.75 x 28 Promus drug-eluting stent, postdilated to greater than 3 mm in diameter. There was some sluggish flow in the very apical LAD. Multiple doses of IC adenosine were given.  TIMI-3 flow was present at the end of the procedure. The patient was pain-free.  IMPRESSIONS:  1. Normal left main coronary artery. 2. Occluded mid left anterior descending artery which was the culprit for today's presentation. This was successfully treated with a 2.75 x 28 Promus drug-eluting stent, postdilated to greater than 3 mm in diameter. 3. Mild disease in the left circumflex artery and its branches. 4. Mild to moderate disease in the right coronary artery. 5. Left ventricular systolic function was not assessed. LVEDP 34 mmHg.  RECOMMENDATION: We'll watch her in the ICU. Start low-dose beta blocker. Start high-dose statin. She needs aggressive secondary prevention. Of note, during the case, we checked about the safety of dual antiplatelet therapy with Dr. Ellene Route, as the patient has small brain aneurysms per her report. She was thought to be appropriate for prolonged dual antiplatelet therapy. She will need dual antiplatelet therapy for at least a year. Check echocardiogram to evaluate LV function.    07/2014 Echo Study Conclusions  - Left ventricle: The cavity size was normal. Wall thickness was normal. Systolic function was normal. The estimated ejection fraction was in the range of 55% to 60%. There is mild hypokinesis of the mid-apicalanteroseptal myocardium. Doppler parameters are consistent with abnormal left ventricular relaxation (grade 1 diastolic dysfunction). Doppler parameters are consistent with elevated ventricular end-diastolic filling pressure. - Aortic valve: Mildly calcified annulus. Trileaflet. - Left atrium: The atrium was mildly dilated. - Right atrium: Central venous pressure (est): 3 mm Hg. - Atrial septum: There was increased thickness of the septum, consistent with lipomatous hypertrophy. - Pulmonary arteries: Systolic pressure could not be accurately estimated. - Pericardium, extracardiac: There was no pericardial  effusion.  Impressions:  - Normal LV wall thickness with LVEF 55-60%, mild mid to apical anteroseptal hypokinesis, and grade 1 diastolic dysfunction. There has been improvement in LVEF compared to the prior study from December 2015. Mild left atrial enlargement. Unable to assess PASP.    Assessment and Plan   1. CAD/ICM - hx of anterior STEMI with DES to LAD. We will d/c brillinta later this month as she has completed a year.  - LVEF initially 40% but has since improved to 55-60% - recent episodes of significant exertional SOB and diaphoresis, given her history unclear if possible anginal equivalent - we will obtain an exercise MPI  2. HTN - at goal, we will continue current meds   F/u pending stress results     Arnoldo Lenis, M.D.

## 2015-11-23 ENCOUNTER — Encounter (HOSPITAL_COMMUNITY)
Admission: RE | Admit: 2015-11-23 | Discharge: 2015-11-23 | Disposition: A | Discharge status: Home / Self Care | Payer: Medicare Other | Source: Ambulatory Visit | Attending: Cardiology | Admitting: Cardiology

## 2015-11-23 ENCOUNTER — Inpatient Hospital Stay (HOSPITAL_COMMUNITY): Admission: RE | Admit: 2015-11-23 | Payer: Medicare Other | Source: Ambulatory Visit

## 2015-11-23 ENCOUNTER — Encounter (HOSPITAL_COMMUNITY): Payer: Self-pay

## 2015-11-23 DIAGNOSIS — R079 Chest pain, unspecified: Secondary | ICD-10-CM | POA: Diagnosis not present

## 2015-11-23 LAB — NM MYOCAR MULTI W/SPECT W/WALL MOTION / EF
CHL CUP MPHR: 150 {beats}/min
CHL CUP NUCLEAR SDS: 5
CHL CUP NUCLEAR SRS: 1
CHL CUP NUCLEAR SSS: 6
CSEPHR: 94 %
Estimated workload: 4.6 METS
Exercise duration (min): 2 min
Exercise duration (sec): 40 s
LHR: 0.26
LV sys vol: 20 mL
LVDIAVOL: 45 mL (ref 46–106)
Peak HR: 142 {beats}/min
RPE: 13
Rest HR: 100 {beats}/min
TID: 0.86

## 2015-11-23 MED ORDER — TECHNETIUM TC 99M TETROFOSMIN IV KIT
30.0000 | PACK | Freq: Once | INTRAVENOUS | Status: AC | PRN
  Administered 2015-11-23: 30.6 via INTRAVENOUS

## 2015-11-23 MED ORDER — SODIUM CHLORIDE 0.9% FLUSH
INTRAVENOUS | Status: AC
  Administered 2015-11-23: 10 mL via INTRAVENOUS
  Filled 2015-11-23: qty 10

## 2015-11-23 MED ORDER — REGADENOSON 0.4 MG/5ML IV SOLN
INTRAVENOUS | Status: AC
  Filled 2015-11-23: qty 5

## 2015-11-23 MED ORDER — TECHNETIUM TC 99M TETROFOSMIN IV KIT
10.0000 | PACK | Freq: Once | INTRAVENOUS | Status: AC | PRN
  Administered 2015-11-23: 10.8 via INTRAVENOUS

## 2015-11-25 ENCOUNTER — Telehealth: Payer: Self-pay | Admitting: *Deleted

## 2015-11-25 DIAGNOSIS — M5134 Other intervertebral disc degeneration, thoracic region: Secondary | ICD-10-CM | POA: Diagnosis not present

## 2015-11-25 DIAGNOSIS — M546 Pain in thoracic spine: Secondary | ICD-10-CM | POA: Diagnosis not present

## 2015-11-25 DIAGNOSIS — M5414 Radiculopathy, thoracic region: Secondary | ICD-10-CM | POA: Diagnosis not present

## 2015-11-25 DIAGNOSIS — M5440 Lumbago with sciatica, unspecified side: Secondary | ICD-10-CM | POA: Diagnosis not present

## 2015-11-25 NOTE — Telephone Encounter (Signed)
Pt aware, says symptoms are unchanged no better no worse. Routed to pcp scheduled 1 month f/u

## 2015-11-25 NOTE — Telephone Encounter (Signed)
-----   Message from Arnoldo Lenis, MD sent at 11/25/2015 12:22 PM EDT ----- Stress test looks good, no evidence of any new blockages. How are her symptoms doing? I'd like to see her in 1 month to reevaluate  J BrancH MD

## 2015-11-26 DIAGNOSIS — M549 Dorsalgia, unspecified: Secondary | ICD-10-CM | POA: Diagnosis not present

## 2015-11-26 DIAGNOSIS — L723 Sebaceous cyst: Secondary | ICD-10-CM | POA: Diagnosis not present

## 2015-11-26 DIAGNOSIS — E2839 Other primary ovarian failure: Secondary | ICD-10-CM | POA: Diagnosis not present

## 2015-11-26 DIAGNOSIS — M542 Cervicalgia: Secondary | ICD-10-CM | POA: Diagnosis not present

## 2015-12-15 ENCOUNTER — Ambulatory Visit (INDEPENDENT_AMBULATORY_CARE_PROVIDER_SITE_OTHER): Payer: Medicare Other | Admitting: Cardiology

## 2015-12-15 ENCOUNTER — Encounter: Payer: Self-pay | Admitting: Cardiology

## 2015-12-15 ENCOUNTER — Encounter: Payer: Self-pay | Admitting: *Deleted

## 2015-12-15 VITALS — BP 126/81 | HR 98 | Ht 64.0 in | Wt 206.0 lb

## 2015-12-15 DIAGNOSIS — I2510 Atherosclerotic heart disease of native coronary artery without angina pectoris: Secondary | ICD-10-CM

## 2015-12-15 DIAGNOSIS — R0602 Shortness of breath: Secondary | ICD-10-CM | POA: Diagnosis not present

## 2015-12-15 DIAGNOSIS — E1142 Type 2 diabetes mellitus with diabetic polyneuropathy: Secondary | ICD-10-CM | POA: Diagnosis not present

## 2015-12-15 DIAGNOSIS — E1165 Type 2 diabetes mellitus with hyperglycemia: Secondary | ICD-10-CM | POA: Diagnosis not present

## 2015-12-15 DIAGNOSIS — I10 Essential (primary) hypertension: Secondary | ICD-10-CM | POA: Diagnosis not present

## 2015-12-15 DIAGNOSIS — Z7189 Other specified counseling: Secondary | ICD-10-CM | POA: Diagnosis not present

## 2015-12-15 DIAGNOSIS — R1031 Right lower quadrant pain: Secondary | ICD-10-CM | POA: Diagnosis not present

## 2015-12-15 DIAGNOSIS — I255 Ischemic cardiomyopathy: Secondary | ICD-10-CM | POA: Diagnosis not present

## 2015-12-15 DIAGNOSIS — Z1389 Encounter for screening for other disorder: Secondary | ICD-10-CM | POA: Diagnosis not present

## 2015-12-15 DIAGNOSIS — E7800 Pure hypercholesterolemia, unspecified: Secondary | ICD-10-CM | POA: Diagnosis not present

## 2015-12-15 DIAGNOSIS — Z299 Encounter for prophylactic measures, unspecified: Secondary | ICD-10-CM | POA: Diagnosis not present

## 2015-12-15 DIAGNOSIS — Z6834 Body mass index (BMI) 34.0-34.9, adult: Secondary | ICD-10-CM | POA: Diagnosis not present

## 2015-12-15 DIAGNOSIS — Z1211 Encounter for screening for malignant neoplasm of colon: Secondary | ICD-10-CM | POA: Diagnosis not present

## 2015-12-15 DIAGNOSIS — Z0000 Encounter for general adult medical examination without abnormal findings: Secondary | ICD-10-CM | POA: Diagnosis not present

## 2015-12-15 MED ORDER — FUROSEMIDE 40 MG PO TABS: ORAL_TABLET | ORAL | 3 refills | Status: DC

## 2015-12-15 NOTE — Patient Instructions (Signed)
Your physician recommends that you schedule a follow-up appointment in: 4-6 [place] [address]. BRANCH  Your physician has recommended you make the following change in your medication:   INCREASE LASIX 40 MG DAILY FOR 4 DAYS THEN TAKE AS NEEDED.   PLEASE CALL us ON Friday WITH AN UPDATE ON SWELLING   Thank you for choosing [facility]!!

## 2015-12-15 NOTE — Progress Notes (Signed)
Clinical Summary Casey Avila is a 70 y.o.female seen today for follow up of the following medical problems.   1. CAD/ICM - admitted with anterior STEMI 05/02/14, s/p DES to LAD. LVEF 40% by echo at that time. Repeat echo 07/2014 shows LVEF 55-60%.  - recent episodes of diaphoresis that occurs with activity. +SOB, no chest pain. No palpitations. Stops and rests, symptoms improve.  - started about 1 month. Approx 3-4 episodes since that time.  - since last visit, she completed a nuclear stress test which showed no significant ischemia.  - stable SOB/DOE since last visit. Weight up 11 lbs since last visit. Takes lasix just prn, does not seem to be responding when she does take it. +PND   2. HTN - home bp is around 110s/70s at home - compliant with meds.    Past Medical History:  Diagnosis Date  . Aneurysm ([place])    brain aneurysms x 2 x several yrs  . Coronary artery disease   . DDD (degenerative disc disease), cervical   . Diabetes mellitus without complication ([place])   . HTN (hypertension)   . Ischemic cardiomyopathy 05/02/2014  . Osteoarthritis   . ST elevation (STEMI) myocardial infarction involving left anterior descending coronary artery ([place]) 05/02/2014  . Ulcerative colitis ([place])      Allergies  Allergen Reactions  . Clindamycin/Lincomycin Itching and Rash    Had to go into ER   . Bactrim [Sulfamethoxazole-Trimethoprim] Rash  . Morphine And Related     Makes me sick      Current Outpatient Prescriptions  Medication Sig Dispense Refill  . ALPRAZolam (XANAX) 0.5 MG tablet Take 0.5 mg by mouth 2 (two) times daily as needed for sleep.    Marland Kitchen aspirin 81 MG chewable tablet Chew 1 tablet (81 mg total) by mouth daily.    Marland Kitchen atorvastatin (LIPITOR) 80 MG tablet Take 1 tablet (80 mg total) by mouth daily. 90 tablet 3  . carvedilol (COREG) 6.25 MG tablet Take 1 tablet (6.25 mg total) by mouth 2 (two) times daily with a meal. 180 tablet 3  . enalapril (VASOTEC) 5 MG tablet  Take 1 tablet (5 mg total) by mouth 2 (two) times daily. 180 tablet 3  . esomeprazole (NEXIUM) 40 MG capsule Take 40 mg by mouth daily at 12 noon.    . furosemide (LASIX) 20 MG tablet Take 1 tablet (20 mg total) by mouth as needed for edema (swelling or weight gain). 90 tablet 0  . HYDROcodone-acetaminophen (NORCO/VICODIN) 5-325 MG per tablet Take 1 tablet by mouth every 6 (six) hours as needed for moderate pain.    Marland Kitchen lidocaine (LIDODERM) 5 % Place 3 patches onto the skin daily. Remove & Discard patch within 12 hours or as directed by MD    . nitroGLYCERIN (NITROSTAT) 0.4 MG SL tablet Place 1 tablet (0.4 mg total) under the tongue every 5 (five) minutes as needed for chest pain. 25 tablet 3  . potassium chloride SA (K-DUR,KLOR-CON) 20 MEQ tablet Take 1 tablet (20 mEq total) by mouth as directed. On the days that you take your as needed Lasix 90 tablet 0  . spironolactone (ALDACTONE) 25 MG tablet Take 0.5 tablets (12.5 mg total) by mouth daily. 45 tablet 3  . topiramate (TOPAMAX) 100 MG tablet Take 100 mg by mouth daily.    Marland Kitchen venlafaxine (EFFEXOR) 75 MG tablet Take 75 mg by mouth at bedtime.      No current facility-administered medications for this visit.  Past Surgical History:  Procedure Laterality Date  . ABDOMINAL HYSTERECTOMY    . BLADDER REMOVAL    . CARDIAC CATHETERIZATION    . LEFT HEART CATHETERIZATION WITH CORONARY ANGIOGRAM N/A 05/02/2014   Procedure: LEFT HEART CATHETERIZATION WITH CORONARY ANGIOGRAM;  Surgeon: Jettie Booze, MD;  Location: [facility] CATH LAB;  Service: Cardiovascular;  Laterality: N/A;  . NECK SURGERY    . PTCA       Allergies  Allergen Reactions  . Clindamycin/Lincomycin Itching and Rash    Had to go into ER   . Bactrim [Sulfamethoxazole-Trimethoprim] Rash  . Morphine And Related     Makes me sick       Family History  Problem Relation Age of Onset  . Anuerysm Mother   . Alcoholism Father   . Anuerysm Other      Social History Ms.  Avila reports that she quit smoking about 19 months ago. Her smoking use included Cigarettes. She started smoking about 49 years ago. She has a 72.00 pack-year smoking history. She has never used smokeless tobacco. Casey Avila reports that she does not drink alcohol.   Review of Systems CONSTITUTIONAL: No weight loss, fever, chills, weakness or fatigue.  HEENT: Eyes: No visual loss, blurred vision, double vision or yellow sclerae.No hearing loss, sneezing, congestion, runny nose or sore throat.  SKIN: No rash or itching.  CARDIOVASCULAR: per HPI RESPIRATORY: per HPI GASTROINTESTINAL: No anorexia, nausea, vomiting or diarrhea. No abdominal pain or blood.  GENITOURINARY: No burning on urination, no polyuria NEUROLOGICAL: No headache, dizziness, syncope, paralysis, ataxia, numbness or tingling in the extremities. No change in bowel or bladder control.  MUSCULOSKELETAL: No muscle, back pain, joint pain or stiffness.  LYMPHATICS: No enlarged nodes. No history of splenectomy.  PSYCHIATRIC: No history of depression or anxiety.  ENDOCRINOLOGIC: No reports of sweating, cold or heat intolerance. No polyuria or polydipsia.  Marland Kitchen   Physical Examination Vitals:   12/15/15 1458  BP: 126/81  Pulse: 98   Vitals:   12/15/15 1458  Weight: 206 lb (93.4 kg)  Height: 5\' 4"  (1.626 m)    Gen: resting comfortably, no acute distress HEENT: no scleral icterus, pupils equal round and reactive, no palptable cervical adenopathy,  CV: RRR, no m/r/g, no jvd Resp: Clear to auscultation bilaterally GI: abdomen is soft, non-tender, non-distended, normal bowel sounds, no hepatosplenomegaly MSK: extremities are warm, no edema.  Skin: warm, no rash Neuro:  no focal deficits Psych: appropriate affect   Diagnostic Studies  04/2014 Echo Study Conclusions  - Left ventricle: Can not rule out apical clot. Severe hypokinesis mid/apical inferior segments, mid/apical anterior segments, mid anteroseptal segment.  The cavity size was normal. Wall thickness was increased in a pattern of mild LVH. The estimated ejection fraction was 40%. Doppler parameters are consistent with abnormal left ventricular relaxation (grade 1 diastolic dysfunction). - Right ventricle: The cavity size was normal. Systolic function was normal.  04/2014 Cath  HEMODYNAMICS: Aortic pressure was 132/67; LV pressure was 136/19; LVEDP 34. There was no gradient between the left ventricle and aorta.   ANGIOGRAPHIC DATA: The left main coronary artery is widely patent.  The left anterior descending artery is a large vessel. The vessel is occluded in the midportion. After intervention, it was noted that there was disease just past the occlusion. The remainder of the mid to distal LAD was widely patent. There were several small diagonals which were patent.  The left circumflex artery is a large vessel with mild irregularities proximally. There  is a large first obtuse marginal which is widely patent. The second obtuse marginal is large and widely patent.  The right coronary artery is a large dominant vessel. In the mid vessel, there is an eccentric 40% stenosis. The posterior descending artery is large and widely patent. The posterior lateral artery is large and widely patent.  LEFT VENTRICULOGRAM: Left ventricular angiogram was not done due to elevated LVEDP. LVEDP was 34 mmHg.  PCI NARRATIVE: A CLS 3.0 guiding catheter was used to engage the left main. A pro-water wire was placed across the area disease in the LAD. A fetch thrombectomy catheter was used to perform aspiration thrombectomy. There was significant thrombus removal. A second pass was performed with even more thrombus removal. TIMI-3 flow was restored after the first pass. There was a residual mid vessel lesion which was about 12 mm in length, up to 80%. Just past this, there was another segment of moderate disease. The more proximal area was ballooned with  a 2.5 x 12 balloon. The entire area was stented with a 2.75 x 28 Promus drug-eluting stent, postdilated to greater than 3 mm in diameter. There was some sluggish flow in the very apical LAD. Multiple doses of IC adenosine were given. TIMI-3 flow was present at the end of the procedure. The patient was pain-free.  IMPRESSIONS:  1. Normal left main coronary artery. 2. Occluded mid left anterior descending artery which was the culprit for today's presentation. This was successfully treated with a 2.75 x 28 Promus drug-eluting stent, postdilated to greater than 3 mm in diameter. 3. Mild disease in the left circumflex artery and its branches. 4. Mild to moderate disease in the right coronary artery. 5. Left ventricular systolic function was not assessed. LVEDP 34 mmHg.  RECOMMENDATION: We'll watch her in the ICU. Start low-dose beta blocker. Start high-dose statin. She needs aggressive secondary prevention. Of note, during the case, we checked about the safety of dual antiplatelet therapy with Dr. Ellene Route, as the patient has small brain aneurysms per her report. She was thought to be appropriate for prolonged dual antiplatelet therapy. She will need dual antiplatelet therapy for at least a year. Check echocardiogram to evaluate LV function.    07/2014 Echo Study Conclusions  - Left ventricle: The cavity size was normal. Wall thickness was normal. Systolic function was normal. The estimated ejection fraction was in the range of 55% to 60%. There is mild hypokinesis of the mid-apicalanteroseptal myocardium. Doppler parameters are consistent with abnormal left ventricular relaxation (grade 1 diastolic dysfunction). Doppler parameters are consistent with elevated ventricular end-diastolic filling pressure. - Aortic valve: Mildly calcified annulus. Trileaflet. - Left atrium: The atrium was mildly dilated. - Right atrium: Central venous pressure (est): 3 mm Hg. - Atrial  septum: There was increased thickness of the septum, consistent with lipomatous hypertrophy. - Pulmonary arteries: Systolic pressure could not be accurately estimated. - Pericardium, extracardiac: There was no pericardial effusion.  Impressions:  - Normal LV wall thickness with LVEF 55-60%, mild mid to apical anteroseptal hypokinesis, and grade 1 diastolic dysfunction. There has been improvement in LVEF compared to the prior study from December 2015. Mild left atrial enlargement. Unable to assess PASP.   11/2015 Nuclear Stress  There was no ST segment deviation noted during stress.  The study is normal. There are no perfusion defects consistent with prior infarction or current ischemia.  This is a low risk study.  The left ventricular ejection fraction is normal (55-65%).    Assessment and Plan  1. CAD/ICM -recent symptoms of SOB and DOE. Nuclear stress without significant ischemia - significant weight gain over the last few weeks, likely some fluid contributing to her symptoms - she reports decreased response to home lasix. We will increase to 40mg . - she will update [place] on Friday on weights and symptoms.   2. HTN - at goal, she we will continue current meds   F/u 4-6 weeks. Request pcp labs   Arnoldo Lenis, M.D.

## 2015-12-18 ENCOUNTER — Telehealth: Payer: Self-pay | Admitting: *Deleted

## 2015-12-18 NOTE — Telephone Encounter (Signed)
If weights accurate she is down 8 lbs since we saw her. I would continue her current meds, perhaps as more weight and fluid comes off she will start to feel better. Have her update [place] on Monday or Tuesday again please   J BrancH MD

## 2015-12-18 NOTE — Telephone Encounter (Signed)
F/u with pt from 7/25 OV - says swelling/SOB hasn't changed since taking lasix 40 mg daily - weight was 198lbs this morning. Will forward to Dr. Harl Bowie

## 2015-12-21 DIAGNOSIS — E119 Type 2 diabetes mellitus without complications: Secondary | ICD-10-CM | POA: Diagnosis not present

## 2015-12-21 DIAGNOSIS — I10 Essential (primary) hypertension: Secondary | ICD-10-CM | POA: Diagnosis not present

## 2015-12-21 NOTE — Telephone Encounter (Signed)
Pt says that weight is down to 196lbs today, says swelling and SOB much better, she did hold lasix Saturday but has resumed taking 40 mg daily. Pt will continue current meds until 8/23 appt.

## 2015-12-31 DIAGNOSIS — R600 Localized edema: Secondary | ICD-10-CM | POA: Diagnosis not present

## 2015-12-31 DIAGNOSIS — G4700 Insomnia, unspecified: Secondary | ICD-10-CM | POA: Diagnosis not present

## 2015-12-31 DIAGNOSIS — E1142 Type 2 diabetes mellitus with diabetic polyneuropathy: Secondary | ICD-10-CM | POA: Diagnosis not present

## 2015-12-31 DIAGNOSIS — E2839 Other primary ovarian failure: Secondary | ICD-10-CM | POA: Diagnosis not present

## 2015-12-31 DIAGNOSIS — I10 Essential (primary) hypertension: Secondary | ICD-10-CM | POA: Diagnosis not present

## 2016-01-12 ENCOUNTER — Encounter: Payer: Self-pay | Admitting: *Deleted

## 2016-01-13 ENCOUNTER — Ambulatory Visit (INDEPENDENT_AMBULATORY_CARE_PROVIDER_SITE_OTHER): Payer: Medicare Other | Admitting: Cardiology

## 2016-01-13 VITALS — BP 91/60 | HR 108 | Ht 64.0 in | Wt 202.0 lb

## 2016-01-13 DIAGNOSIS — I255 Ischemic cardiomyopathy: Secondary | ICD-10-CM | POA: Diagnosis not present

## 2016-01-13 DIAGNOSIS — I2510 Atherosclerotic heart disease of native coronary artery without angina pectoris: Secondary | ICD-10-CM

## 2016-01-13 DIAGNOSIS — I9589 Other hypotension: Secondary | ICD-10-CM | POA: Diagnosis not present

## 2016-01-13 DIAGNOSIS — I10 Essential (primary) hypertension: Secondary | ICD-10-CM

## 2016-01-13 DIAGNOSIS — I5041 Acute combined systolic (congestive) and diastolic (congestive) heart failure: Secondary | ICD-10-CM | POA: Diagnosis not present

## 2016-01-13 NOTE — Progress Notes (Signed)
Clinical Summary Casey Avila is a 70 y.o.female seen today for follow up of the following medical problems.    1. CAD/ICM - admitted with anterior STEMI 05/02/14, s/p DES to LAD. LVEF 40% by echo at that time. Repeat echo 07/2014 shows LVEF 55-60%.  - recent episodes of diaphoresis that occurs with activity. +SOB, no chest pain. No palpitations. Stops and rests, symptoms improve.  - started about 1 month. Approx 3-4 episodes since that time.  - she completed a nuclear stress test which showed no significant ischemia.   - last visit we increased her lasix to 40mg  daily. Occasionally will take 60mg  if needed. Swelling has improved. Improved breathing with diuresis.  - home weights around 197 lbs and stable    2. HTN - home bp is around 100s/60s. Occasional lightheadness/dizziness.  - compliant with meds.  Past Medical History:  Diagnosis Date  . Aneurysm ([place])    brain aneurysms x 2 x several yrs  . Coronary artery disease   . DDD (degenerative disc disease), cervical   . Diabetes mellitus without complication ([place])   . HTN (hypertension)   . Ischemic cardiomyopathy 05/02/2014  . Osteoarthritis   . ST elevation (STEMI) myocardial infarction involving left anterior descending coronary artery ([place]) 05/02/2014  . Ulcerative colitis ([place])      Allergies  Allergen Reactions  . Clindamycin/Lincomycin Itching and Rash    Had to go into ER   . Bactrim [Sulfamethoxazole-Trimethoprim] Rash  . Morphine And Related     Makes me sick      Current Outpatient Prescriptions  Medication Sig Dispense Refill  . ALPRAZolam (XANAX) 0.5 MG tablet Take 0.5 mg by mouth 2 (two) times daily as needed for sleep.    Marland Kitchen aspirin 81 MG chewable tablet Chew 1 tablet (81 mg total) by mouth daily.    Marland Kitchen atorvastatin (LIPITOR) 80 MG tablet Take 1 tablet (80 mg total) by mouth daily. 90 tablet 3  . carvedilol (COREG) 6.25 MG tablet Take 1 tablet (6.25 mg total) by mouth 2 (two) times daily with a  meal. 180 tablet 3  . enalapril (VASOTEC) 5 MG tablet Take 1 tablet (5 mg total) by mouth 2 (two) times daily. 180 tablet 3  . esomeprazole (NEXIUM) 40 MG capsule Take 40 mg by mouth daily at 12 noon.    . furosemide (LASIX) 40 MG tablet TAKE 1 TAB DAILY FOR 4 DAYS THEN AS NEEDED 90 tablet 3  . HYDROcodone-acetaminophen (NORCO/VICODIN) 5-325 MG per tablet Take 1 tablet by mouth every 6 (six) hours as needed for moderate pain.    Marland Kitchen lidocaine (LIDODERM) 5 % Place 3 patches onto the skin daily. Remove & Discard patch within 12 hours or as directed by MD    . nitroGLYCERIN (NITROSTAT) 0.4 MG SL tablet Place 1 tablet (0.4 mg total) under the tongue every 5 (five) minutes as needed for chest pain. 25 tablet 3  . potassium chloride SA (K-DUR,KLOR-CON) 20 MEQ tablet Take 1 tablet (20 mEq total) by mouth as directed. On the days that you take your as needed Lasix 90 tablet 0  . spironolactone (ALDACTONE) 25 MG tablet Take 0.5 tablets (12.5 mg total) by mouth daily. 45 tablet 3  . topiramate (TOPAMAX) 100 MG tablet Take 100 mg by mouth daily.    . Turmeric 1053 MG TABS Take 2 tablets by mouth at bedtime.    Marland Kitchen venlafaxine (EFFEXOR) 75 MG tablet Take 75 mg by mouth at bedtime.  No current facility-administered medications for this visit.      Past Surgical History:  Procedure Laterality Date  . ABDOMINAL HYSTERECTOMY    . BLADDER REMOVAL    . CARDIAC CATHETERIZATION    . LEFT HEART CATHETERIZATION WITH CORONARY ANGIOGRAM N/A 05/02/2014   Procedure: LEFT HEART CATHETERIZATION WITH CORONARY ANGIOGRAM;  Surgeon: Jettie Booze, MD;  Location: [facility] CATH LAB;  Service: Cardiovascular;  Laterality: N/A;  . NECK SURGERY    . PTCA       Allergies  Allergen Reactions  . Clindamycin/Lincomycin Itching and Rash    Had to go into ER   . Bactrim [Sulfamethoxazole-Trimethoprim] Rash  . Morphine And Related     Makes me sick       Family History  Problem Relation Age of Onset  . Casey Avila    . Casey Avila   . Casey Other      Social History Casey Avila reports that she quit smoking about 20 months ago. Her smoking use included Cigarettes. She started smoking about 49 years ago. She has a 72.00 pack-year smoking history. She has never used smokeless tobacco. Casey Avila reports that she does not drink alcohol.   Review of Systems CONSTITUTIONAL: No weight loss, fever, chills, weakness or fatigue.  HEENT: Eyes: No visual loss, blurred vision, double vision or yellow sclerae.No hearing loss, sneezing, congestion, runny nose or sore throat.  SKIN: No rash or itching.  CARDIOVASCULAR: per HPI RESPIRATORY: No shortness of breath, cough or sputum.  GASTROINTESTINAL: No anorexia, nausea, vomiting or diarrhea. No abdominal pain or blood.  GENITOURINARY: No burning on urination, no polyuria NEUROLOGICAL: No headache, dizziness, syncope, paralysis, ataxia, numbness or tingling in the extremities. No change in bowel or bladder control.  MUSCULOSKELETAL: No muscle, back pain, joint pain or stiffness.  LYMPHATICS: No enlarged nodes. No history of splenectomy.  PSYCHIATRIC: No history of depression or anxiety.  ENDOCRINOLOGIC: No reports of sweating, cold or heat intolerance. No polyuria or polydipsia.  Marland Kitchen   Physical Examination Vitals:   01/13/16 1319 01/13/16 1332  BP: 99/66 91/60  Pulse: (!) 105 (!) 108    Vitals:   01/13/16 1319  Weight: 202 lb (91.6 kg)  Height: 5\' 4"  (1.626 m)    Gen: resting comfortably, no acute distress HEENT: no scleral icterus, pupils equal round and reactive, no palptable cervical adenopathy,  CV: RRR, no m/r/g, no jvd Resp: Clear to auscultation bilaterally GI: abdomen is soft, non-tender, non-distended, normal bowel sounds, no hepatosplenomegaly MSK: extremities are warm, no edema.  Skin: warm, no rash Neuro:  no focal deficits Psych: appropriate affect   Diagnostic Studies 04/2014 Echo Study Conclusions  - Left ventricle:  Can not rule out apical clot. Severe hypokinesis mid/apical inferior segments, mid/apical anterior segments, mid anteroseptal segment. The cavity size was normal. Wall thickness was increased in a pattern of mild LVH. The estimated ejection fraction was 40%. Doppler parameters are consistent with abnormal left ventricular relaxation (grade 1 diastolic dysfunction). - Right ventricle: The cavity size was normal. Systolic function was normal.  04/2014 Cath  HEMODYNAMICS: Aortic pressure was 132/67; LV pressure was 136/19; LVEDP 34. There was no gradient between the left ventricle and aorta.   ANGIOGRAPHIC DATA: The left main coronary artery is widely patent.  The left anterior descending artery is a large vessel. The vessel is occluded in the midportion. After intervention, it was noted that there was disease just past the occlusion. The remainder of the mid to distal LAD was  widely patent. There were several small diagonals which were patent.  The left circumflex artery is a large vessel with mild irregularities proximally. There is a large first obtuse marginal which is widely patent. The second obtuse marginal is large and widely patent.  The right coronary artery is a large dominant vessel. In the mid vessel, there is an eccentric 40% stenosis. The posterior descending artery is large and widely patent. The posterior lateral artery is large and widely patent.  LEFT VENTRICULOGRAM: Left ventricular angiogram was not done due to elevated LVEDP. LVEDP was 34 mmHg.  PCI NARRATIVE: A CLS 3.0 guiding catheter was used to engage the left main. A pro-water wire was placed across the area disease in the LAD. A fetch thrombectomy catheter was used to perform aspiration thrombectomy. There was significant thrombus removal. A second pass was performed with even more thrombus removal. TIMI-3 flow was restored after the first pass. There was a residual mid vessel lesion which  was about 12 mm in length, up to 80%. Just past this, there was another segment of moderate disease. The more proximal area was ballooned with a 2.5 x 12 balloon. The entire area was stented with a 2.75 x 28 Promus drug-eluting stent, postdilated to greater than 3 mm in diameter. There was some sluggish flow in the very apical LAD. Multiple doses of IC adenosine were given. TIMI-3 flow was present at the end of the procedure. The patient was pain-free.  IMPRESSIONS:  1. Normal left main coronary artery. 2. Occluded mid left anterior descending artery which was the culprit for today's presentation. This was successfully treated with a 2.75 x 28 Promus drug-eluting stent, postdilated to greater than 3 mm in diameter. 3. Mild disease in the left circumflex artery and its branches. 4. Mild to moderate disease in the right coronary artery. 5. Left ventricular systolic function was not assessed. LVEDP 34 mmHg.  RECOMMENDATION: We'll watch her in the ICU. Start low-dose beta blocker. Start high-dose statin. She needs aggressive secondary prevention. Of note, during the case, we checked about the safety of dual antiplatelet therapy with Dr. Ellene Route, as the patient has small brain aneurysms per her report. She was thought to be appropriate for prolonged dual antiplatelet therapy. She will need dual antiplatelet therapy for at least a year. Check echocardiogram to evaluate LV function.    07/2014 Echo Study Conclusions  - Left ventricle: The cavity size was normal. Wall thickness was normal. Systolic function was normal. The estimated ejection fraction was in the range of 55% to 60%. There is mild hypokinesis of the mid-apicalanteroseptal myocardium. Doppler parameters are consistent with abnormal left ventricular relaxation (grade 1 diastolic dysfunction). Doppler parameters are consistent with elevated ventricular end-diastolic filling pressure. - Aortic valve: Mildly  calcified annulus. Trileaflet. - Left atrium: The atrium was mildly dilated. - Right atrium: Central venous pressure (est): 3 mm Hg. - Atrial septum: There was increased thickness of the septum, consistent with lipomatous hypertrophy. - Pulmonary arteries: Systolic pressure could not be accurately estimated. - Pericardium, extracardiac: There was no pericardial effusion.  Impressions:  - Normal LV wall thickness with LVEF 55-60%, mild mid to apical anteroseptal hypokinesis, and grade 1 diastolic dysfunction. There has been improvement in LVEF compared to the prior study from December 2015. Mild left atrial enlargement. Unable to assess PASP.   11/2015 Nuclear Stress  There was no ST segment deviation noted during stress.  The study is normal. There are no perfusion defects consistent with prior infarction or current  ischemia.  This is a low risk study.  The left ventricular ejection fraction is normal (55-65%).     Assessment and Plan   1. CAD/ICM -SOB improved with increased lasix, however now with some orthostatic symptoms and sinus tach I suspected due to EKG in clinic shows sinus tach.  - she will cut back on her prn lasix and follow symptoms. Ideal weight probably around 205 lbs as opposed to low 200s.  - repeat labs.   2. HTN - at goal, running on the low side, I suspected due to over diuresis. Decrease prn lasix.    F/u 2 months      Arnoldo Lenis, M.D

## 2016-01-13 NOTE — Patient Instructions (Signed)
Your physician recommends that you schedule a follow-up appointment in: 2 MONTHS WITH DR. [place]  Your physician recommends that you continue on your current medications as directed. Please refer to the Current Medication list given to you today.  Your physician recommends that you return for lab work BMP/CBC/TSH/MG  Thank you for choosing [facility]!!

## 2016-01-15 ENCOUNTER — Encounter: Payer: Self-pay | Admitting: Cardiology

## 2016-01-18 DIAGNOSIS — H353132 Nonexudative age-related macular degeneration, bilateral, intermediate dry stage: Secondary | ICD-10-CM | POA: Diagnosis not present

## 2016-01-18 DIAGNOSIS — D3131 Benign neoplasm of right choroid: Secondary | ICD-10-CM | POA: Diagnosis not present

## 2016-01-18 DIAGNOSIS — H40013 Open angle with borderline findings, low risk, bilateral: Secondary | ICD-10-CM | POA: Diagnosis not present

## 2016-01-18 DIAGNOSIS — H524 Presbyopia: Secondary | ICD-10-CM | POA: Diagnosis not present

## 2016-01-18 DIAGNOSIS — H2513 Age-related nuclear cataract, bilateral: Secondary | ICD-10-CM | POA: Diagnosis not present

## 2016-01-18 DIAGNOSIS — E119 Type 2 diabetes mellitus without complications: Secondary | ICD-10-CM | POA: Diagnosis not present

## 2016-01-18 DIAGNOSIS — Z7984 Long term (current) use of oral hypoglycemic drugs: Secondary | ICD-10-CM | POA: Diagnosis not present

## 2016-01-19 ENCOUNTER — Telehealth: Payer: Self-pay | Admitting: *Deleted

## 2016-01-19 NOTE — Telephone Encounter (Signed)
-----   Message from Arnoldo Lenis, MD sent at 01/18/2016 10:50 AM EDT ----- Labs look good  Zandra Abts MD

## 2016-01-19 NOTE — Telephone Encounter (Signed)
Pt aware, routed to pcp 

## 2016-01-20 ENCOUNTER — Other Ambulatory Visit: Payer: Self-pay | Admitting: Internal Medicine

## 2016-01-20 DIAGNOSIS — Z1231 Encounter for screening mammogram for malignant neoplasm of breast: Secondary | ICD-10-CM

## 2016-01-20 DIAGNOSIS — L820 Inflamed seborrheic keratosis: Secondary | ICD-10-CM | POA: Diagnosis not present

## 2016-01-20 DIAGNOSIS — L814 Other melanin hyperpigmentation: Secondary | ICD-10-CM | POA: Diagnosis not present

## 2016-01-20 DIAGNOSIS — L821 Other seborrheic keratosis: Secondary | ICD-10-CM | POA: Diagnosis not present

## 2016-01-20 DIAGNOSIS — L570 Actinic keratosis: Secondary | ICD-10-CM | POA: Diagnosis not present

## 2016-01-20 DIAGNOSIS — L720 Epidermal cyst: Secondary | ICD-10-CM | POA: Diagnosis not present

## 2016-01-20 DIAGNOSIS — L918 Other hypertrophic disorders of the skin: Secondary | ICD-10-CM | POA: Diagnosis not present

## 2016-01-21 ENCOUNTER — Ambulatory Visit
Admission: RE | Admit: 2016-01-21 | Discharge: 2016-01-21 | Disposition: A | Discharge status: Home / Self Care | Payer: Medicare Other | Source: Ambulatory Visit | Attending: Internal Medicine | Admitting: Internal Medicine

## 2016-01-21 DIAGNOSIS — Z1231 Encounter for screening mammogram for malignant neoplasm of breast: Secondary | ICD-10-CM | POA: Diagnosis not present

## 2016-01-29 IMAGING — CT CT ANGIO HEAD
1 of 9 series · 12 of 47 positions shown · IV contrast (80CC OMNI 350)
Comparison: 01/24/2013

CLINICAL DATA: Intracranial aneurysm follow-up. History right MCA
and left posterior communicating artery aneurysms. Headaches.

EXAM:
CT ANGIOGRAPHY HEAD
TECHNIQUE: Multidetector CT imaging of the head was performed using the
standard protocol during bolus administration of intravenous
contrast. Multiplanar CT image reconstructions and MIPs were
obtained to evaluate the vascular anatomy.
CONTRAST:  80mL OMNIPAQUE IOHEXOL 350 MG/ML SOLN

[Series 500: axial thin · axial · 0.49mm/px · z∈[+145,+286]mm · 12 of 167 slices shown]
[im 13/167  brain]
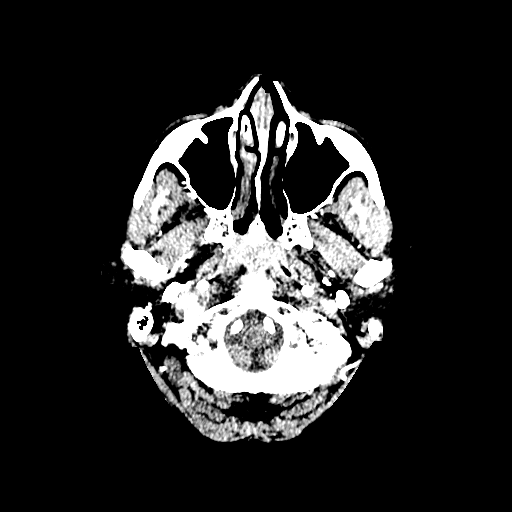
[im 26/167  bone]
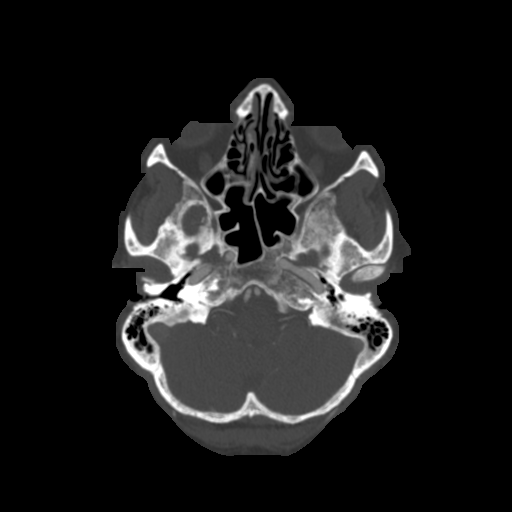
[im 39/167  brain]
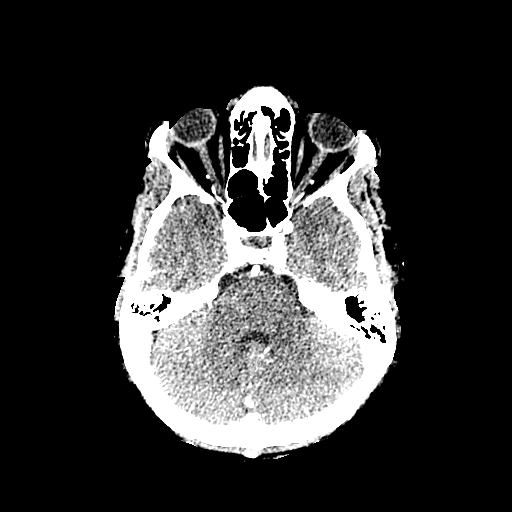
[im 52/167  bone]
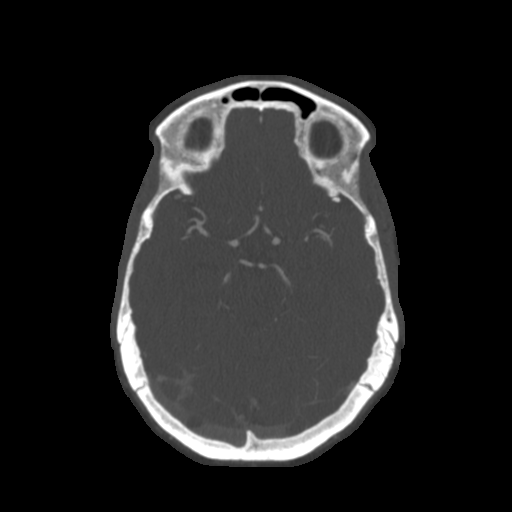
[im 64/167  brain]
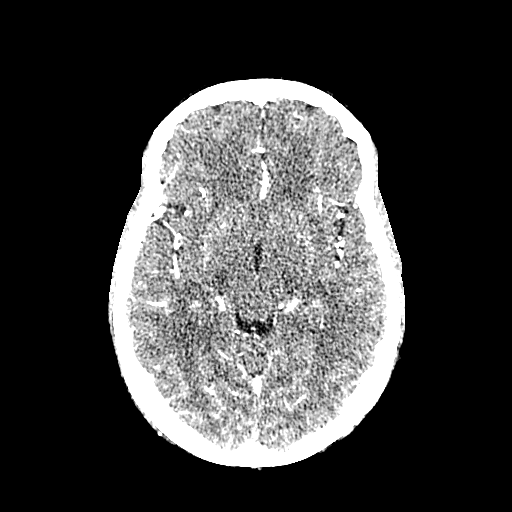
[im 77/167  bone]
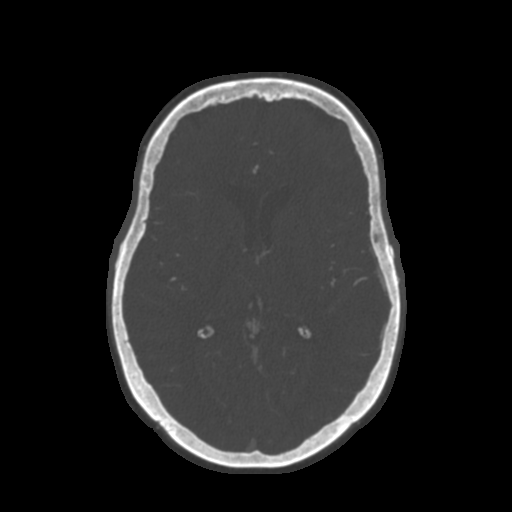
[im 90/167  brain]
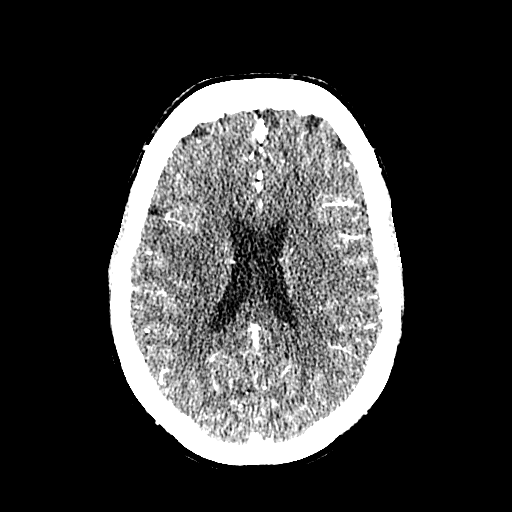
[im 103/167  bone]
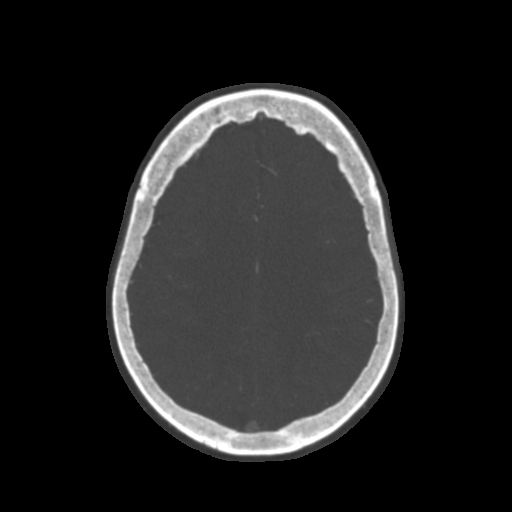
[im 115/167  brain]
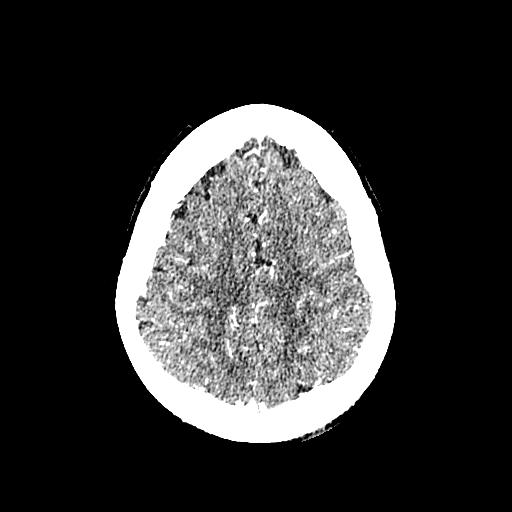
[im 128/167  bone]
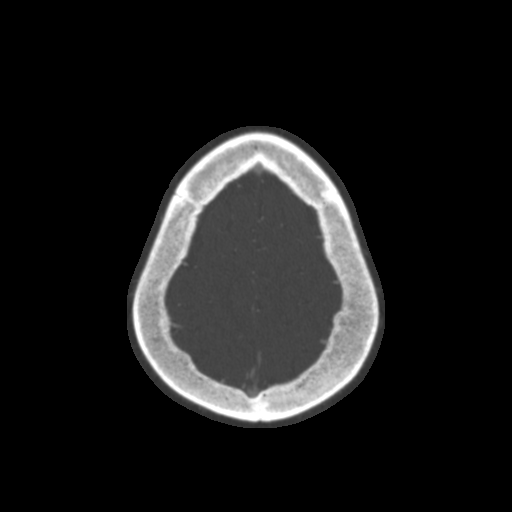
[im 141/167  brain]
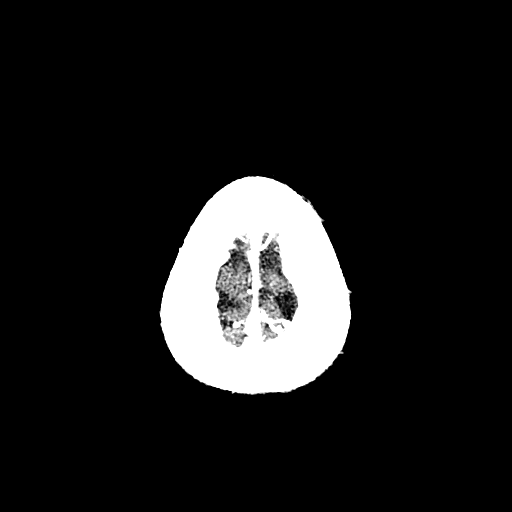
[im 154/167  bone]
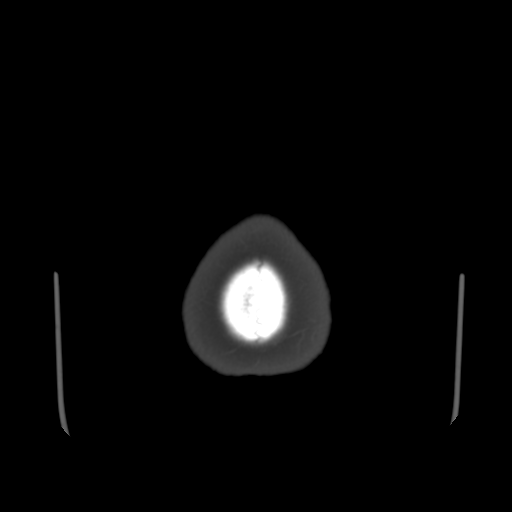

[12 of 47 positions shown; findings below may reference images not displayed]

FINDINGS: There is no evidence of acute cortical infarct, intracranial
hemorrhage, mass, midline shift, or extra-axial fluid collection.
Ventricles and sulci are within normal limits for age. Mild
periventricular white matter hypodensities are similar to the prior
study and nonspecific but compatible with mild chronic small vessel
ischemic disease. There is no abnormal enhancement. Mastoid air
cells and visualized paranasal sinuses are clear.

Visualized distal vertebral arteries are patent and codominant.
PICAs appear patent, with the right sharing a common origin with the
right AICA. SCA origins are patent. Basilar artery is patent without
stenosis. PCAs are unremarkable.

Internal carotid arteries are patent from skullbase to carotid
termini. There is mild atherosclerotic irregularity of both carotid
siphons without stenosis. 3 mm long x 1 mm wide inferiorly directed
vascular structure in the left posterior communicating artery origin
region is unchanged and compatible with a small aneurysm. 3 x 4 mm
anteroinferiorly directed aneurysm from the right MCA trifurcation
is unchanged. ACAs and MCAs are otherwise unremarkable.

Review of the MIP images confirms the above findings.
IMPRESSION: Unchanged 4 mm right MCA trifurcation aneurysm and 3 x 1 mm left
posterior communicating artery aneurysm.

## 2016-02-16 DIAGNOSIS — M546 Pain in thoracic spine: Secondary | ICD-10-CM | POA: Diagnosis not present

## 2016-02-16 DIAGNOSIS — M5414 Radiculopathy, thoracic region: Secondary | ICD-10-CM | POA: Diagnosis not present

## 2016-02-16 DIAGNOSIS — Z6834 Body mass index (BMI) 34.0-34.9, adult: Secondary | ICD-10-CM | POA: Diagnosis not present

## 2016-02-16 DIAGNOSIS — M5440 Lumbago with sciatica, unspecified side: Secondary | ICD-10-CM | POA: Diagnosis not present

## 2016-02-18 DIAGNOSIS — Z23 Encounter for immunization: Secondary | ICD-10-CM | POA: Diagnosis not present

## 2016-03-03 DIAGNOSIS — I10 Essential (primary) hypertension: Secondary | ICD-10-CM | POA: Diagnosis not present

## 2016-03-03 DIAGNOSIS — E119 Type 2 diabetes mellitus without complications: Secondary | ICD-10-CM | POA: Diagnosis not present

## 2016-03-14 ENCOUNTER — Ambulatory Visit: Payer: Medicare Other | Admitting: Cardiology

## 2016-03-15 DIAGNOSIS — Z6834 Body mass index (BMI) 34.0-34.9, adult: Secondary | ICD-10-CM | POA: Diagnosis not present

## 2016-03-15 DIAGNOSIS — G4700 Insomnia, unspecified: Secondary | ICD-10-CM | POA: Diagnosis not present

## 2016-03-15 DIAGNOSIS — L03119 Cellulitis of unspecified part of limb: Secondary | ICD-10-CM | POA: Diagnosis not present

## 2016-03-15 DIAGNOSIS — E1165 Type 2 diabetes mellitus with hyperglycemia: Secondary | ICD-10-CM | POA: Diagnosis not present

## 2016-03-15 DIAGNOSIS — E1142 Type 2 diabetes mellitus with diabetic polyneuropathy: Secondary | ICD-10-CM | POA: Diagnosis not present

## 2016-03-15 DIAGNOSIS — Z299 Encounter for prophylactic measures, unspecified: Secondary | ICD-10-CM | POA: Diagnosis not present

## 2016-03-19 ENCOUNTER — Emergency Department (HOSPITAL_COMMUNITY)
Admission: EM | Admit: 2016-03-19 | Discharge: 2016-03-19 | Disposition: A | Discharge status: Home / Self Care | Payer: Medicare Other | Attending: Emergency Medicine | Admitting: Emergency Medicine

## 2016-03-19 ENCOUNTER — Encounter (HOSPITAL_COMMUNITY): Payer: Self-pay | Admitting: *Deleted

## 2016-03-19 DIAGNOSIS — Z7982 Long term (current) use of aspirin: Secondary | ICD-10-CM | POA: Insufficient documentation

## 2016-03-19 DIAGNOSIS — I252 Old myocardial infarction: Secondary | ICD-10-CM | POA: Diagnosis not present

## 2016-03-19 DIAGNOSIS — E119 Type 2 diabetes mellitus without complications: Secondary | ICD-10-CM | POA: Diagnosis not present

## 2016-03-19 DIAGNOSIS — Z87891 Personal history of nicotine dependence: Secondary | ICD-10-CM | POA: Diagnosis not present

## 2016-03-19 DIAGNOSIS — I2510 Atherosclerotic heart disease of native coronary artery without angina pectoris: Secondary | ICD-10-CM | POA: Diagnosis not present

## 2016-03-19 DIAGNOSIS — I10 Essential (primary) hypertension: Secondary | ICD-10-CM | POA: Insufficient documentation

## 2016-03-19 DIAGNOSIS — L02215 Cutaneous abscess of perineum: Secondary | ICD-10-CM | POA: Diagnosis not present

## 2016-03-19 DIAGNOSIS — N764 Abscess of vulva: Secondary | ICD-10-CM | POA: Diagnosis present

## 2016-03-19 DIAGNOSIS — L0291 Cutaneous abscess, unspecified: Secondary | ICD-10-CM

## 2016-03-19 LAB — CBG MONITORING, ED: Glucose-Capillary: 108 mg/dL — ABNORMAL HIGH (ref 65–99)

## 2016-03-19 MED ORDER — LIDOCAINE-EPINEPHRINE-TETRACAINE (LET) SOLUTION
3.0000 mL | Freq: Once | NASAL | Status: AC
  Administered 2016-03-19: 3 mL via TOPICAL
  Filled 2016-03-19: qty 3

## 2016-03-19 MED ORDER — ONDANSETRON 4 MG PO TBDP
4.0000 mg | ORAL_TABLET | Freq: Once | ORAL | Status: AC
  Administered 2016-03-19: 4 mg via ORAL
  Filled 2016-03-19: qty 1

## 2016-03-19 MED ORDER — MORPHINE SULFATE (PF) 4 MG/ML IV SOLN
4.0000 mg | Freq: Once | INTRAVENOUS | Status: AC
  Administered 2016-03-19: 4 mg via INTRAMUSCULAR
  Filled 2016-03-19: qty 1

## 2016-03-19 MED ORDER — LIDOCAINE HCL (PF) 1 % IJ SOLN: 30.0000 mL | Freq: Once | INTRAMUSCULAR | Status: DC

## 2016-03-19 MED ORDER — OXYCODONE-ACETAMINOPHEN 5-325 MG PO TABS
1.0000 | ORAL_TABLET | ORAL | Status: DC | PRN
  Administered 2016-03-19: 1 via ORAL

## 2016-03-19 MED ORDER — OXYCODONE-ACETAMINOPHEN 5-325 MG PO TABS
ORAL_TABLET | ORAL | Status: AC
  Filled 2016-03-19: qty 1

## 2016-03-19 MED ORDER — LIDOCAINE-EPINEPHRINE 2 %-1:100000 IJ SOLN
20.0000 mL | Freq: Once | INTRAMUSCULAR | Status: AC
  Administered 2016-03-19: 20 mL via INTRADERMAL
  Filled 2016-03-19: qty 20

## 2016-03-19 NOTE — ED Triage Notes (Signed)
Patient reports she had abcess on the right labia for a month. Patient states the area seemed to be better.  She took antibiotics.  Patient states the area returned last weeik.  She states she started antibiotic on Tuesday.  Patient states the area is too large for him to lance and suggested ED visit.  Patient is taking doxycycline.   She states no drainage at this time

## 2016-03-19 NOTE — ED Provider Notes (Signed)
[facility] DEPT Provider Note   CSN: [id number] Arrival date & time: 03/19/16  1322     History   Chief Complaint Chief Complaint  Patient presents with  . Recurrent Skin Infections    boil on the right labia   HPI   Blood pressure 156/75, pulse 97, temperature 98.6 F (37 C), temperature source Oral, resp. rate 22, SpO2 96 %.  Casey Avila is a 70 y.o. female with past medical history significant for diabetes complaining of boil to right via onset 2 weeks ago, saw primary care started her on doxy twice a day which she's been taking for greater than a week however, abscess is growing, pain is severe. She has not checked her sugar in one week. She denies fever, chills, nausea, vomiting, pain with defecation, perianal pain.  Past Medical History:  Diagnosis Date  . Aneurysm ([place])    brain aneurysms x 2 x several yrs  . Coronary artery disease   . DDD (degenerative disc disease), cervical   . Diabetes mellitus without complication ([place])   . HTN (hypertension)   . Ischemic cardiomyopathy 05/02/2014  . Osteoarthritis   . ST elevation (STEMI) myocardial infarction involving left anterior descending coronary artery ([place]) 05/02/2014  . Ulcerative colitis [facility])     Patient Active Problem List   Diagnosis Date Noted  . Sinus tachycardia 05/05/2014  . Cardiomyopathy, ischemic - status post anterior STEMI with EF 40% 05/05/2014  . Acute combined systolic and diastolic heart failure ([place]) 05/05/2014  . Hyperlipidemia with target LDL less than 70 05/05/2014  . Current smoker 05/05/2014  . Essential hypertension   . ST elevation (STEMI) myocardial infarction involving left anterior descending coronary artery ([place])   . GERD 07/21/2008  . DIVERTICULOSIS-COLON 07/21/2008  . FLATULENCE-GAS-BLOATING 07/21/2008  . DIARRHEA 07/21/2008  . ABDOMINAL PAIN-GENERALIZED 07/21/2008  . ABDOMINAL PAIN-MULTIPLE SITES 07/21/2008  . PERSONAL HX COLONIC POLYPS 07/21/2008    Past Surgical  History:  Procedure Laterality Date  . ABDOMINAL HYSTERECTOMY    . BLADDER REMOVAL    . CARDIAC CATHETERIZATION    . LEFT HEART CATHETERIZATION WITH CORONARY ANGIOGRAM N/A 05/02/2014   Procedure: LEFT HEART CATHETERIZATION WITH CORONARY ANGIOGRAM;  Surgeon: Jettie Booze, MD;  Location: [facility] CATH LAB;  Service: Cardiovascular;  Laterality: N/A;  . NECK SURGERY    . PTCA      OB History    Gravida Para Term Preterm AB Living   2 2 2          SAB TAB Ectopic Multiple Live Births                   Home Medications    Prior to Admission medications   Medication Sig Start Date End Date Taking? Authorizing Provider  aspirin 81 MG chewable tablet Chew 1 tablet (81 mg total) by mouth daily. 05/05/14   Rhonda G Barrett, PA-C  atorvastatin (LIPITOR) 80 MG tablet Take 1 tablet (80 mg total) by mouth daily. 11/13/15   Arnoldo Lenis, MD  carvedilol (COREG) 6.25 MG tablet Take 1 tablet (6.25 mg total) by mouth 2 (two) times daily with a meal. 11/13/15   Arnoldo Lenis, MD  enalapril (VASOTEC) 5 MG tablet Take 1 tablet (5 mg total) by mouth 2 (two) times daily. 11/13/15   Arnoldo Lenis, MD  esomeprazole (NEXIUM) 40 MG capsule Take 40 mg by mouth daily at 12 noon.    Historical Provider, MD  furosemide (LASIX) 40 MG tablet TAKE 1  TAB DAILY FOR 4 DAYS THEN AS NEEDED 12/15/15   Arnoldo Lenis, MD  HYDROcodone-acetaminophen (NORCO/VICODIN) 5-325 MG per tablet Take 1 tablet by mouth every 6 (six) hours as needed for moderate pain.    Historical Provider, MD  lidocaine (LIDODERM) 5 % Place 3 patches onto the skin daily. Remove & Discard patch within 12 hours or as directed by MD    Historical Provider, MD  nitroGLYCERIN (NITROSTAT) 0.4 MG SL tablet Place 1 tablet (0.4 mg total) under the tongue every 5 (five) minutes as needed for chest pain. 11/13/15   Arnoldo Lenis, MD  potassium chloride SA (K-DUR,KLOR-CON) 20 MEQ tablet Take 1 tablet (20 mEq total) by mouth as directed. On the days  that you take your as needed Lasix 05/01/15   Arnoldo Lenis, MD  spironolactone (ALDACTONE) 25 MG tablet Take 0.5 tablets (12.5 mg total) by mouth daily. 11/13/15   Arnoldo Lenis, MD  topiramate (TOPAMAX) 100 MG tablet Take 100 mg by mouth daily.    Historical Provider, MD  venlafaxine (EFFEXOR) 75 MG tablet Take 75 mg by mouth at bedtime.     Historical Provider, MD    Family History Family History  Problem Relation Age of Onset  . Anuerysm Mother   . Alcoholism Father   . Anuerysm Other     Social History Social History  Substance Use Topics  . Smoking status: Former Smoker    Packs/day: 1.50    Years: 48.00    Types: Cigarettes    Start date: 05/09/1966    Quit date: 05/01/2014  . Smokeless tobacco: Never Used  . Alcohol use No     Allergies   Clindamycin/lincomycin; Bactrim [sulfamethoxazole-trimethoprim]; and Morphine and related   Review of Systems Review of Systems  10 systems reviewed and found to be negative, except as noted in the HPI.   Physical Exam Updated Vital Signs BP 156/75 (BP Location: Left Arm)   Pulse 97   Temp 98.6 F (37 C) (Oral)   Resp 22   SpO2 96%   Physical Exam  Constitutional: She is oriented to person, place, and time. She appears well-developed and well-nourished. No distress.  HENT:  Head: Normocephalic and atraumatic.  Mouth/Throat: Oropharynx is clear and moist.  Eyes: Conjunctivae and EOM are normal. Pupils are equal, round, and reactive to light.  Neck: Normal range of motion.  Cardiovascular: Normal rate, regular rhythm and intact distal pulses.   Pulmonary/Chest: Effort normal and breath sounds normal.  Abdominal: Soft. There is no tenderness.  Genitourinary:     Musculoskeletal: Normal range of motion.  Neurological: She is alert and oriented to person, place, and time.  Skin: She is not diaphoretic.  Psychiatric: She has a normal mood and affect.  Nursing note and vitals reviewed.    ED Treatments /  Results  Labs (all labs ordered are listed, but only abnormal results are displayed) Labs Reviewed  CBG MONITORING, ED    EKG  EKG Interpretation None       Radiology No results found.  Procedures .Marland KitchenIncision and Drainage Date/Time: 03/19/2016 9:21 PM Performed by: Monico Blitz Authorized by: Monico Blitz   Consent:    Consent obtained:  Verbal   Consent given by:  Patient Location:    Type:  Abscess   Location:  Anogenital   Anogenital location:  Perineum Pre-procedure details:    Skin preparation:  Betadine Procedure type:    Complexity:  Simple Procedure details:    Needle aspiration:  no     Incision types:  Single straight   Incision depth:  Dermal   Scalpel blade:  11   Wound management:  Probed and deloculated, irrigated with saline and extensive cleaning   Drainage:  Bloody and purulent   Drainage amount:  Copious   Wound treatment:  Wound left open   Packing materials:  None Post-procedure details:    Patient tolerance of procedure:  Tolerated well, no immediate complications   (including critical care time)  Medications Ordered in ED Medications  oxyCODONE-acetaminophen (PERCOCET/ROXICET) 5-325 MG per tablet 1 tablet (1 tablet Oral Given 03/19/16 1410)  oxyCODONE-acetaminophen (PERCOCET/ROXICET) 5-325 MG per tablet (not administered)  lidocaine-EPINEPHrine-tetracaine (LET) solution (not administered)  ondansetron (ZOFRAN-ODT) disintegrating tablet 4 mg (not administered)  morphine 4 MG/ML injection 4 mg (not administered)  lidocaine-EPINEPHrine (XYLOCAINE W/EPI) 2 %-1:100000 (with pres) injection 20 mL (not administered)     Initial Impression / Assessment and Plan / ED Course  I have reviewed the triage vital signs and the nursing notes.  Pertinent labs & imaging results that were available during my care of the patient were reviewed by me and considered in my medical decision making (see chart for details).  Clinical Course      Vitals:   03/19/16 1348  BP: 156/75  Pulse: 97  Resp: 22  Temp: 98.6 F (37 C)  TempSrc: Oral  SpO2: 96%    Medications  oxyCODONE-acetaminophen (PERCOCET/ROXICET) 5-325 MG per tablet 1 tablet (1 tablet Oral Given 03/19/16 1410)  oxyCODONE-acetaminophen (PERCOCET/ROXICET) 5-325 MG per tablet (not administered)  lidocaine-EPINEPHrine-tetracaine (LET) solution (3 mLs Topical Given 03/19/16 1702)  ondansetron (ZOFRAN-ODT) disintegrating tablet 4 mg (4 mg Oral Given 03/19/16 1701)  morphine 4 MG/ML injection 4 mg (4 mg Intramuscular Given 03/19/16 1658)  lidocaine-EPINEPHrine (XYLOCAINE W/EPI) 2 %-1:100000 (with pres) injection 20 mL (20 mLs Intradermal Given 03/19/16 1744)    Casey Avila is 70 y.o. female presenting with Recurrent abscess to perianal area. No signs of systemic infection, normal blood sugar. I and D is performed, counseled patient on wound care and return precautions. Counseled to continue her doxycycline.  Evaluation does not show pathology that would require ongoing emergent intervention or inpatient treatment. Pt is hemodynamically stable and mentating appropriately. Discussed findings and plan with patient/guardian, who agrees with care plan. All questions answered. Return precautions discussed and outpatient follow up given.      Final Clinical Impressions(s) / ED Diagnoses   Final diagnoses:  None    New Prescriptions New Prescriptions   No medications on file     Monico Blitz, PA-C 03/19/16 2123    Leonard Schwartz, MD 03/20/16 2026

## 2016-03-19 NOTE — Discharge Instructions (Signed)
Continue to take her doxycycline as directed. Do not hesitate to return to the emergency department for any new, worsening or concerning symptoms including fever, worsening pain.  Wash the area with soap and water morning and night and after urination, take sitz baths after defecation or twice a day.

## 2016-03-23 DIAGNOSIS — Z299 Encounter for prophylactic measures, unspecified: Secondary | ICD-10-CM | POA: Diagnosis not present

## 2016-03-23 DIAGNOSIS — N764 Abscess of vulva: Secondary | ICD-10-CM | POA: Diagnosis not present

## 2016-03-23 DIAGNOSIS — J449 Chronic obstructive pulmonary disease, unspecified: Secondary | ICD-10-CM | POA: Diagnosis not present

## 2016-03-23 DIAGNOSIS — Z6835 Body mass index (BMI) 35.0-35.9, adult: Secondary | ICD-10-CM | POA: Diagnosis not present

## 2016-03-23 DIAGNOSIS — E1165 Type 2 diabetes mellitus with hyperglycemia: Secondary | ICD-10-CM | POA: Diagnosis not present

## 2016-03-23 DIAGNOSIS — E1142 Type 2 diabetes mellitus with diabetic polyneuropathy: Secondary | ICD-10-CM | POA: Diagnosis not present

## 2016-03-31 DIAGNOSIS — E119 Type 2 diabetes mellitus without complications: Secondary | ICD-10-CM | POA: Diagnosis not present

## 2016-03-31 DIAGNOSIS — I10 Essential (primary) hypertension: Secondary | ICD-10-CM | POA: Diagnosis not present

## 2016-04-12 ENCOUNTER — Ambulatory Visit (INDEPENDENT_AMBULATORY_CARE_PROVIDER_SITE_OTHER): Payer: Medicare Other | Admitting: Cardiology

## 2016-04-12 ENCOUNTER — Encounter: Payer: Self-pay | Admitting: Cardiology

## 2016-04-12 VITALS — BP 119/69 | HR 99 | Ht 64.0 in | Wt 207.0 lb

## 2016-04-12 DIAGNOSIS — I10 Essential (primary) hypertension: Secondary | ICD-10-CM

## 2016-04-12 DIAGNOSIS — E782 Mixed hyperlipidemia: Secondary | ICD-10-CM

## 2016-04-12 DIAGNOSIS — I2510 Atherosclerotic heart disease of native coronary artery without angina pectoris: Secondary | ICD-10-CM | POA: Diagnosis not present

## 2016-04-12 DIAGNOSIS — I255 Ischemic cardiomyopathy: Secondary | ICD-10-CM | POA: Diagnosis not present

## 2016-04-12 NOTE — Patient Instructions (Signed)

## 2016-04-12 NOTE — Progress Notes (Signed)
Clinical Summary Casey Avila is a 70 y.o.female seen today for follow up of the following medical problems.    1. CAD/ICM - admitted with anterior STEMI 05/02/14, s/p DES to LAD. LVEF 40% by echo at that time. Repeat echo 07/2014 shows LVEF 55-60%.  - recent episodes of diaphoresis that occurs with activity. +SOB, no chest pain. No palpitations. Stops and rests, symptoms improve.  - started about 1 month. Approx 3-4 episodes since that time.  - she completed a nuclear stress test which showed no significant ischemia.     - had some orthostatic symptoms last visit, was instructed to cut back on her prn lasix. Goal weight probable around 205 as opposed to low 200s. - epigastric pain at times, better with eating. No exertional chest pain.  - compliant with meds. Takes lasix prn, about 2-3 times a month. Orthostatic symptoms improved since last visit.   2. HTN - home bp's around 110-120s/60-70s.  3. Fatigue - feels tired and sleepy - typically goes to bed around 11pm, wakes up 5-6AM.  - denies any snoring, no apneic episodes - she thinks could be related to her vitamin B12 which has been a problem in th epast  4. Hyperlipidemia - 11/2015 TC 115 TG 210 HDL 45 LDL 28  - compliant with statin   Past Medical History:  Diagnosis Date  . Aneurysm ([place])    brain aneurysms x 2 x several yrs  . Coronary artery disease   . DDD (degenerative disc disease), cervical   . Diabetes mellitus without complication ([place])   . HTN (hypertension)   . Ischemic cardiomyopathy 05/02/2014  . Osteoarthritis   . ST elevation (STEMI) myocardial infarction involving left anterior descending coronary artery ([place]) 05/02/2014  . Ulcerative colitis ([place])      Allergies  Allergen Reactions  . Clindamycin/Lincomycin Itching and Rash    Had to go into ER   . Bactrim [Sulfamethoxazole-Trimethoprim] Rash  . Morphine And Related     Makes me sick      Current Outpatient Prescriptions  Medication  Sig Dispense Refill  . aspirin 81 MG chewable tablet Chew 1 tablet (81 mg total) by mouth daily.    Marland Kitchen atorvastatin (LIPITOR) 80 MG tablet Take 1 tablet (80 mg total) by mouth daily. 90 tablet 3  . carvedilol (COREG) 6.25 MG tablet Take 1 tablet (6.25 mg total) by mouth 2 (two) times daily with a meal. 180 tablet 3  . enalapril (VASOTEC) 5 MG tablet Take 1 tablet (5 mg total) by mouth 2 (two) times daily. 180 tablet 3  . esomeprazole (NEXIUM) 40 MG capsule Take 40 mg by mouth daily at 12 noon.    . furosemide (LASIX) 40 MG tablet TAKE 1 TAB DAILY FOR 4 DAYS THEN AS NEEDED 90 tablet 3  . HYDROcodone-acetaminophen (NORCO/VICODIN) 5-325 MG per tablet Take 1 tablet by mouth every 6 (six) hours as needed for moderate pain.    Marland Kitchen lidocaine (LIDODERM) 5 % Place 3 patches onto the skin daily. Remove & Discard patch within 12 hours or as directed by MD    . nitroGLYCERIN (NITROSTAT) 0.4 MG SL tablet Place 1 tablet (0.4 mg total) under the tongue every 5 (five) minutes as needed for chest pain. 25 tablet 3  . potassium chloride SA (K-DUR,KLOR-CON) 20 MEQ tablet Take 1 tablet (20 mEq total) by mouth as directed. On the days that you take your as needed Lasix 90 tablet 0  . spironolactone (ALDACTONE) 25 MG  tablet Take 0.5 tablets (12.5 mg total) by mouth daily. 45 tablet 3  . topiramate (TOPAMAX) 100 MG tablet Take 100 mg by mouth daily.    Marland Kitchen venlafaxine (EFFEXOR) 75 MG tablet Take 75 mg by mouth at bedtime.      No current facility-administered medications for this visit.      Past Surgical History:  Procedure Laterality Date  . ABDOMINAL HYSTERECTOMY    . BLADDER REMOVAL    . CARDIAC CATHETERIZATION    . LEFT HEART CATHETERIZATION WITH CORONARY ANGIOGRAM N/A 05/02/2014   Procedure: LEFT HEART CATHETERIZATION WITH CORONARY ANGIOGRAM;  Surgeon: Jettie Booze, MD;  Location: [facility] CATH LAB;  Service: Cardiovascular;  Laterality: N/A;  . NECK SURGERY    . PTCA       Allergies  Allergen Reactions    . Clindamycin/Lincomycin Itching and Rash    Had to go into ER   . Bactrim [Sulfamethoxazole-Trimethoprim] Rash  . Morphine And Related     Makes me sick       Family History  Problem Relation Age of Onset  . Anuerysm Mother   . Alcoholism Father   . Anuerysm Other      Social History Casey Avila reports that she quit smoking about 1 years ago. Her smoking use included Cigarettes. She started smoking about 49 years ago. She has a 72.00 pack-year smoking history. She has never used smokeless tobacco. Casey Avila reports that she does not drink alcohol.   Review of Systems CONSTITUTIONAL: +generalized fatigue HEENT: Eyes: No visual loss, blurred vision, double vision or yellow sclerae.No hearing loss, sneezing, congestion, runny nose or sore throat.  SKIN: No rash or itching.  CARDIOVASCULAR:  RESPIRATORY: No shortness of breath, cough or sputum.  GASTROINTESTINAL: No anorexia, nausea, vomiting or diarrhea. No abdominal pain or blood.  GENITOURINARY: No burning on urination, no polyuria NEUROLOGICAL: No headache, dizziness, syncope, paralysis, ataxia, numbness or tingling in the extremities. No change in bowel or bladder control.  MUSCULOSKELETAL: No muscle, back pain, joint pain or stiffness.  LYMPHATICS: No enlarged nodes. No history of splenectomy.  PSYCHIATRIC: No history of depression or anxiety.  ENDOCRINOLOGIC: No reports of sweating, cold or heat intolerance. No polyuria or polydipsia.  Marland Kitchen   Physical Examination Vitals:   04/12/16 0959  BP: 119/69  Pulse: 99   Vitals:   04/12/16 0959  Weight: 207 lb (93.9 kg)  Height: 5\' 4"  (1.626 m)    Gen: resting comfortably, no acute distress HEENT: no scleral icterus, pupils equal round and reactive, no palptable cervical adenopathy,  CV: RRR, no m/r/g, no jvd Resp: Clear to auscultation bilaterally GI: abdomen is soft, non-tender, non-distended, normal bowel sounds, no hepatosplenomegaly MSK: extremities are warm, no  edema.  Skin: warm, no rash Neuro:  no focal deficits Psych: appropriate affect   Diagnostic Studies 04/2014 Echo Study Conclusions  - Left ventricle: Can not rule out apical clot. Severe hypokinesis mid/apical inferior segments, mid/apical anterior segments, mid anteroseptal segment. The cavity size was normal. Wall thickness was increased in a pattern of mild LVH. The estimated ejection fraction was 40%. Doppler parameters are consistent with abnormal left ventricular relaxation (grade 1 diastolic dysfunction). - Right ventricle: The cavity size was normal. Systolic function was normal.  04/2014 Cath  HEMODYNAMICS: Aortic pressure was 132/67; LV pressure was 136/19; LVEDP 34. There was no gradient between the left ventricle and aorta.   ANGIOGRAPHIC DATA: The left main coronary artery is widely patent.  The left anterior descending artery  is a large vessel. The vessel is occluded in the midportion. After intervention, it was noted that there was disease just past the occlusion. The remainder of the mid to distal LAD was widely patent. There were several small diagonals which were patent.  The left circumflex artery is a large vessel with mild irregularities proximally. There is a large first obtuse marginal which is widely patent. The second obtuse marginal is large and widely patent.  The right coronary artery is a large dominant vessel. In the mid vessel, there is an eccentric 40% stenosis. The posterior descending artery is large and widely patent. The posterior lateral artery is large and widely patent.  LEFT VENTRICULOGRAM: Left ventricular angiogram was not done due to elevated LVEDP. LVEDP was 34 mmHg.  PCI NARRATIVE: A CLS 3.0 guiding catheter was used to engage the left main. A pro-water wire was placed across the area disease in the LAD. A fetch thrombectomy catheter was used to perform aspiration thrombectomy. There was significant thrombus  removal. A second pass was performed with even more thrombus removal. TIMI-3 flow was restored after the first pass. There was a residual mid vessel lesion which was about 12 mm in length, up to 80%. Just past this, there was another segment of moderate disease. The more proximal area was ballooned with a 2.5 x 12 balloon. The entire area was stented with a 2.75 x 28 Promus drug-eluting stent, postdilated to greater than 3 mm in diameter. There was some sluggish flow in the very apical LAD. Multiple doses of IC adenosine were given. TIMI-3 flow was present at the end of the procedure. The patient was pain-free.  IMPRESSIONS:  1. Normal left main coronary artery. 2. Occluded mid left anterior descending artery which was the culprit for today's presentation. This was successfully treated with a 2.75 x 28 Promus drug-eluting stent, postdilated to greater than 3 mm in diameter. 3. Mild disease in the left circumflex artery and its branches. 4. Mild to moderate disease in the right coronary artery. 5. Left ventricular systolic function was not assessed. LVEDP 34 mmHg.  RECOMMENDATION: We'll watch her in the ICU. Start low-dose beta blocker. Start high-dose statin. She needs aggressive secondary prevention. Of note, during the case, we checked about the safety of dual antiplatelet therapy with Dr. Ellene Route, as the patient has small brain aneurysms per her report. She was thought to be appropriate for prolonged dual antiplatelet therapy. She will need dual antiplatelet therapy for at least a year. Check echocardiogram to evaluate LV function.    07/2014 Echo Study Conclusions  - Left ventricle: The cavity size was normal. Wall thickness was normal. Systolic function was normal. The estimated ejection fraction was in the range of 55% to 60%. There is mild hypokinesis of the mid-apicalanteroseptal myocardium. Doppler parameters are consistent with abnormal left ventricular relaxation  (grade 1 diastolic dysfunction). Doppler parameters are consistent with elevated ventricular end-diastolic filling pressure. - Aortic valve: Mildly calcified annulus. Trileaflet. - Left atrium: The atrium was mildly dilated. - Right atrium: Central venous pressure (est): 3 mm Hg. - Atrial septum: There was increased thickness of the septum, consistent with lipomatous hypertrophy. - Pulmonary arteries: Systolic pressure could not be accurately estimated. - Pericardium, extracardiac: There was no pericardial effusion.  Impressions:  - Normal LV wall thickness with LVEF 55-60%, mild mid to apical anteroseptal hypokinesis, and grade 1 diastolic dysfunction. There has been improvement in LVEF compared to the prior study from December 2015. Mild left atrial enlargement. Unable to  assess PASP.   11/2015 Nuclear Stress  There was no ST segment deviation noted during stress.  The study is normal. There are no perfusion defects consistent with prior infarction or current ischemia.  This is a low risk study.  The left ventricular ejection fraction is normal (55-65%).     Assessment and Plan  1. CAD/ICM -no recent symptoms, continue current meds  2. HTN - at goal, continue current meds  3. Fatigue - generalized fatigue, symptoms are not cardiac - she is to discuss with pcp  4. Hyperlipidemia - at goal, continue statin   F/u 6 months      Arnoldo Lenis, M.D., F.A.C.C.

## 2016-04-19 DIAGNOSIS — M79606 Pain in leg, unspecified: Secondary | ICD-10-CM | POA: Diagnosis not present

## 2016-04-19 DIAGNOSIS — Z6835 Body mass index (BMI) 35.0-35.9, adult: Secondary | ICD-10-CM | POA: Diagnosis not present

## 2016-04-19 DIAGNOSIS — Z713 Dietary counseling and surveillance: Secondary | ICD-10-CM | POA: Diagnosis not present

## 2016-04-19 DIAGNOSIS — Z299 Encounter for prophylactic measures, unspecified: Secondary | ICD-10-CM | POA: Diagnosis not present

## 2016-04-19 DIAGNOSIS — J449 Chronic obstructive pulmonary disease, unspecified: Secondary | ICD-10-CM | POA: Diagnosis not present

## 2016-04-28 DIAGNOSIS — I10 Essential (primary) hypertension: Secondary | ICD-10-CM | POA: Diagnosis not present

## 2016-04-28 DIAGNOSIS — E119 Type 2 diabetes mellitus without complications: Secondary | ICD-10-CM | POA: Diagnosis not present

## 2016-05-12 DIAGNOSIS — Z299 Encounter for prophylactic measures, unspecified: Secondary | ICD-10-CM | POA: Diagnosis not present

## 2016-05-12 DIAGNOSIS — L0390 Cellulitis, unspecified: Secondary | ICD-10-CM | POA: Diagnosis not present

## 2016-05-12 DIAGNOSIS — L0291 Cutaneous abscess, unspecified: Secondary | ICD-10-CM | POA: Diagnosis not present

## 2016-05-12 DIAGNOSIS — E1165 Type 2 diabetes mellitus with hyperglycemia: Secondary | ICD-10-CM | POA: Diagnosis not present

## 2016-05-12 DIAGNOSIS — E1142 Type 2 diabetes mellitus with diabetic polyneuropathy: Secondary | ICD-10-CM | POA: Diagnosis not present

## 2016-05-12 DIAGNOSIS — Z6835 Body mass index (BMI) 35.0-35.9, adult: Secondary | ICD-10-CM | POA: Diagnosis not present

## 2016-06-06 DIAGNOSIS — E1140 Type 2 diabetes mellitus with diabetic neuropathy, unspecified: Secondary | ICD-10-CM | POA: Diagnosis not present

## 2016-06-06 DIAGNOSIS — K5190 Ulcerative colitis, unspecified, without complications: Secondary | ICD-10-CM | POA: Diagnosis not present

## 2016-06-06 DIAGNOSIS — E1142 Type 2 diabetes mellitus with diabetic polyneuropathy: Secondary | ICD-10-CM | POA: Diagnosis not present

## 2016-06-06 DIAGNOSIS — Z713 Dietary counseling and surveillance: Secondary | ICD-10-CM | POA: Diagnosis not present

## 2016-06-06 DIAGNOSIS — Z87891 Personal history of nicotine dependence: Secondary | ICD-10-CM | POA: Diagnosis not present

## 2016-06-06 DIAGNOSIS — I2510 Atherosclerotic heart disease of native coronary artery without angina pectoris: Secondary | ICD-10-CM | POA: Diagnosis not present

## 2016-06-06 DIAGNOSIS — Z299 Encounter for prophylactic measures, unspecified: Secondary | ICD-10-CM | POA: Diagnosis not present

## 2016-06-06 DIAGNOSIS — E1165 Type 2 diabetes mellitus with hyperglycemia: Secondary | ICD-10-CM | POA: Diagnosis not present

## 2016-06-06 DIAGNOSIS — E7800 Pure hypercholesterolemia, unspecified: Secondary | ICD-10-CM | POA: Diagnosis not present

## 2016-06-06 DIAGNOSIS — J449 Chronic obstructive pulmonary disease, unspecified: Secondary | ICD-10-CM | POA: Diagnosis not present

## 2016-06-06 DIAGNOSIS — Z6835 Body mass index (BMI) 35.0-35.9, adult: Secondary | ICD-10-CM | POA: Diagnosis not present

## 2016-06-06 DIAGNOSIS — R05 Cough: Secondary | ICD-10-CM | POA: Diagnosis not present

## 2016-06-22 DIAGNOSIS — I10 Essential (primary) hypertension: Secondary | ICD-10-CM | POA: Diagnosis not present

## 2016-06-22 DIAGNOSIS — Z6836 Body mass index (BMI) 36.0-36.9, adult: Secondary | ICD-10-CM | POA: Diagnosis not present

## 2016-06-22 DIAGNOSIS — M546 Pain in thoracic spine: Secondary | ICD-10-CM | POA: Diagnosis not present

## 2016-06-22 DIAGNOSIS — M5440 Lumbago with sciatica, unspecified side: Secondary | ICD-10-CM | POA: Diagnosis not present

## 2016-06-22 DIAGNOSIS — M5416 Radiculopathy, lumbar region: Secondary | ICD-10-CM | POA: Diagnosis not present

## 2016-06-28 DIAGNOSIS — M542 Cervicalgia: Secondary | ICD-10-CM | POA: Diagnosis not present

## 2016-06-28 DIAGNOSIS — I10 Essential (primary) hypertension: Secondary | ICD-10-CM | POA: Diagnosis not present

## 2016-06-28 DIAGNOSIS — E7800 Pure hypercholesterolemia, unspecified: Secondary | ICD-10-CM | POA: Diagnosis not present

## 2016-06-28 DIAGNOSIS — E1142 Type 2 diabetes mellitus with diabetic polyneuropathy: Secondary | ICD-10-CM | POA: Diagnosis not present

## 2016-06-28 DIAGNOSIS — M549 Dorsalgia, unspecified: Secondary | ICD-10-CM | POA: Diagnosis not present

## 2016-06-28 DIAGNOSIS — E1165 Type 2 diabetes mellitus with hyperglycemia: Secondary | ICD-10-CM | POA: Diagnosis not present

## 2016-06-28 DIAGNOSIS — Z713 Dietary counseling and surveillance: Secondary | ICD-10-CM | POA: Diagnosis not present

## 2016-06-28 DIAGNOSIS — Z6835 Body mass index (BMI) 35.0-35.9, adult: Secondary | ICD-10-CM | POA: Diagnosis not present

## 2016-06-28 DIAGNOSIS — Z299 Encounter for prophylactic measures, unspecified: Secondary | ICD-10-CM | POA: Diagnosis not present

## 2016-07-06 DIAGNOSIS — E119 Type 2 diabetes mellitus without complications: Secondary | ICD-10-CM | POA: Diagnosis not present

## 2016-07-06 DIAGNOSIS — I10 Essential (primary) hypertension: Secondary | ICD-10-CM | POA: Diagnosis not present

## 2016-08-09 DIAGNOSIS — I10 Essential (primary) hypertension: Secondary | ICD-10-CM | POA: Diagnosis not present

## 2016-08-09 DIAGNOSIS — M810 Age-related osteoporosis without current pathological fracture: Secondary | ICD-10-CM | POA: Diagnosis not present

## 2016-08-09 DIAGNOSIS — I2510 Atherosclerotic heart disease of native coronary artery without angina pectoris: Secondary | ICD-10-CM | POA: Diagnosis not present

## 2016-08-09 DIAGNOSIS — Z6834 Body mass index (BMI) 34.0-34.9, adult: Secondary | ICD-10-CM | POA: Diagnosis not present

## 2016-08-09 DIAGNOSIS — E1142 Type 2 diabetes mellitus with diabetic polyneuropathy: Secondary | ICD-10-CM | POA: Diagnosis not present

## 2016-08-09 DIAGNOSIS — E1165 Type 2 diabetes mellitus with hyperglycemia: Secondary | ICD-10-CM | POA: Diagnosis not present

## 2016-08-09 DIAGNOSIS — K219 Gastro-esophageal reflux disease without esophagitis: Secondary | ICD-10-CM | POA: Diagnosis not present

## 2016-08-09 DIAGNOSIS — J449 Chronic obstructive pulmonary disease, unspecified: Secondary | ICD-10-CM | POA: Diagnosis not present

## 2016-08-09 DIAGNOSIS — E7800 Pure hypercholesterolemia, unspecified: Secondary | ICD-10-CM | POA: Diagnosis not present

## 2016-08-09 DIAGNOSIS — Z789 Other specified health status: Secondary | ICD-10-CM | POA: Diagnosis not present

## 2016-08-09 DIAGNOSIS — K5190 Ulcerative colitis, unspecified, without complications: Secondary | ICD-10-CM | POA: Diagnosis not present

## 2016-08-09 DIAGNOSIS — Z299 Encounter for prophylactic measures, unspecified: Secondary | ICD-10-CM | POA: Diagnosis not present

## 2016-08-12 ENCOUNTER — Other Ambulatory Visit (HOSPITAL_COMMUNITY): Payer: Self-pay | Admitting: Internal Medicine

## 2016-08-12 DIAGNOSIS — Z713 Dietary counseling and surveillance: Secondary | ICD-10-CM | POA: Diagnosis not present

## 2016-08-12 DIAGNOSIS — J449 Chronic obstructive pulmonary disease, unspecified: Secondary | ICD-10-CM | POA: Diagnosis not present

## 2016-08-12 DIAGNOSIS — Z6834 Body mass index (BMI) 34.0-34.9, adult: Secondary | ICD-10-CM | POA: Diagnosis not present

## 2016-08-12 DIAGNOSIS — Z87891 Personal history of nicotine dependence: Secondary | ICD-10-CM

## 2016-08-12 DIAGNOSIS — E1142 Type 2 diabetes mellitus with diabetic polyneuropathy: Secondary | ICD-10-CM | POA: Diagnosis not present

## 2016-08-12 DIAGNOSIS — Z299 Encounter for prophylactic measures, unspecified: Secondary | ICD-10-CM | POA: Diagnosis not present

## 2016-08-12 DIAGNOSIS — E7800 Pure hypercholesterolemia, unspecified: Secondary | ICD-10-CM | POA: Diagnosis not present

## 2016-08-12 DIAGNOSIS — N907 Vulvar cyst: Secondary | ICD-10-CM | POA: Diagnosis not present

## 2016-08-12 DIAGNOSIS — I10 Essential (primary) hypertension: Secondary | ICD-10-CM | POA: Diagnosis not present

## 2016-08-25 ENCOUNTER — Ambulatory Visit (HOSPITAL_COMMUNITY)
Admission: RE | Admit: 2016-08-25 | Discharge: 2016-08-25 | Disposition: A | Discharge status: Home / Self Care | Payer: Medicare Other | Source: Ambulatory Visit | Attending: Internal Medicine | Admitting: Internal Medicine

## 2016-08-25 DIAGNOSIS — Z87891 Personal history of nicotine dependence: Secondary | ICD-10-CM | POA: Insufficient documentation

## 2016-08-25 DIAGNOSIS — I700 Atherosclerosis of aorta: Secondary | ICD-10-CM | POA: Insufficient documentation

## 2016-08-25 DIAGNOSIS — I2510 Atherosclerotic heart disease of native coronary artery without angina pectoris: Secondary | ICD-10-CM | POA: Insufficient documentation

## 2016-09-02 ENCOUNTER — Encounter: Payer: Self-pay | Admitting: Physician Assistant

## 2016-09-06 ENCOUNTER — Other Ambulatory Visit (INDEPENDENT_AMBULATORY_CARE_PROVIDER_SITE_OTHER): Payer: Medicare Other

## 2016-09-06 ENCOUNTER — Ambulatory Visit (INDEPENDENT_AMBULATORY_CARE_PROVIDER_SITE_OTHER): Payer: Medicare Other | Admitting: Physician Assistant

## 2016-09-06 ENCOUNTER — Encounter: Payer: Self-pay | Admitting: Physician Assistant

## 2016-09-06 VITALS — BP 110/60 | HR 93 | Ht 64.0 in | Wt 202.0 lb

## 2016-09-06 DIAGNOSIS — Z86010 Personal history of colonic polyps: Secondary | ICD-10-CM

## 2016-09-06 DIAGNOSIS — K529 Noninfective gastroenteritis and colitis, unspecified: Secondary | ICD-10-CM

## 2016-09-06 DIAGNOSIS — R1012 Left upper quadrant pain: Secondary | ICD-10-CM

## 2016-09-06 DIAGNOSIS — K279 Peptic ulcer, site unspecified, unspecified as acute or chronic, without hemorrhage or perforation: Secondary | ICD-10-CM

## 2016-09-06 DIAGNOSIS — Z8719 Personal history of other diseases of the digestive system: Secondary | ICD-10-CM

## 2016-09-06 LAB — COMPREHENSIVE METABOLIC PANEL
ALBUMIN: 4.3 g/dL (ref 3.5–5.2)
ALT: 35 U/L (ref 0–35)
AST: 23 U/L (ref 0–37)
Alkaline Phosphatase: 75 U/L (ref 39–117)
BILIRUBIN TOTAL: 0.4 mg/dL (ref 0.2–1.2)
BUN: 8 mg/dL (ref 6–23)
CALCIUM: 9.1 mg/dL (ref 8.4–10.5)
CO2: 29 mEq/L (ref 19–32)
CREATININE: 0.82 mg/dL (ref 0.40–1.20)
Chloride: 108 mEq/L (ref 96–112)
GFR: 72.97 mL/min (ref >60.00)
GLUCOSE: 103 mg/dL — AB (ref 70–99)
Potassium: 4.2 mEq/L (ref 3.5–5.1)
Sodium: 141 mEq/L (ref 135–145)
TOTAL PROTEIN: 7 g/dL (ref 6.0–8.3)

## 2016-09-06 LAB — CBC WITH DIFFERENTIAL/PLATELET
Basophils Absolute: 0.1 10*3/uL (ref 0.0–0.1)
Basophils Relative: 1.2 % (ref 0.0–3.0)
EOS PCT: 1.9 % (ref 0.0–5.0)
Eosinophils Absolute: 0.2 10*3/uL (ref 0.0–0.7)
HEMATOCRIT: 39 % (ref 36.0–46.0)
Hemoglobin: 13.2 g/dL (ref 12.0–15.0)
LYMPHS ABS: 3.8 10*3/uL (ref 0.7–4.0)
LYMPHS PCT: 36 % (ref 12.0–46.0)
MCHC: 33.9 g/dL (ref 30.0–36.0)
MCV: 90.8 fl (ref 78.0–100.0)
MONOS PCT: 9.4 % (ref 3.0–12.0)
Monocytes Absolute: 1 10*3/uL (ref 0.1–1.0)
NEUTROS PCT: 51.5 % (ref 43.0–77.0)
Neutro Abs: 5.5 10*3/uL (ref 1.4–7.7)
Platelets: 234 10*3/uL (ref 150.0–400.0)
RBC: 4.29 Mil/uL (ref 3.87–5.11)
RDW: 13.5 % (ref 11.5–15.5)
WBC: 10.7 10*3/uL — ABNORMAL HIGH (ref 4.0–10.5)

## 2016-09-06 LAB — LIPASE: Lipase: 16 U/L (ref 11.0–59.0)

## 2016-09-06 MED ORDER — NA SULFATE-K SULFATE-MG SULF 17.5-3.13-1.6 GM/177ML PO SOLN: 1.0000 | ORAL | 0 refills | Status: DC

## 2016-09-06 NOTE — Progress Notes (Addendum)
Subjective:    Patient ID: Casey Avila, female    DOB: 1946/03/09, 71 y.o.   MRN: [id number]  HPI Casey Avila is a pleasant 71 year old white female, referred by Dr. Lenord Fellers  Internal medicine for GI evaluation with complaints of upper and left-sided abdominal pain over the past several months in the setting of history of ulcerative colitis. Patient is known remotely to Dr. Alyce Pagan. She has history of hypertension, ischemic cardiomyopathy with EF of 50-55%, coronary artery disease status post MI with drug-eluting stent to the LAD. She is followed by Dr. Harl Bowie. Patient was seen here in 2005 by Dr. Suella Grove and had colonoscopy which showed a tortuous colon and left-sided diverticulosis. Showed a 5 mm polyp removed which was hyperplastic. She also had EGD at that time showed distal esophagitis, otherwise negative exam and biopsies were negative for Barrett's. Patient relates remote history of diagnosis of ulcerative colitis and peptic ulcer disease both in the 1980s. She says she was followed by Dr. Rachelle Hora at that time required steroids, and then eventually symptoms subsided and she has not had any active colitis symptoms since. She and her husband relate that this she has had fairly chronic problems with diarrhea for years however she requires chronic pain medication for chronic back pain and she says that the pain medications lowest her bowels down. She still tends to have soft to looser stools but just once or twice daily. She has not had any bleeding. She describes her upper abdominal and left upper quadrant pain as burning in nature and associated with abdominal bloating. She feels the pain reminds her of her colitis-type pain. She is on Nexium 40 mg by mouth daily for reflux type symptoms, she takes 1 baby aspirin per day denies any other NSAID use and no blood thinners. No dysphagia or odynophagia. She apparently also had a colonoscopy in 2014 by a Dr. Britta Mccreedy at [facility]. I requested those records but did not have a copy of the report.  Review of Systems Pertinent positive and negative review of systems were noted in the above HPI section.  All other review of systems was otherwise negative.  Outpatient Encounter Prescriptions as of 09/06/2016  Medication Sig  . aspirin 81 MG chewable tablet Chew 1 tablet (81 mg total) by mouth daily.  Marland Kitchen atorvastatin (LIPITOR) 80 MG tablet Take 1 tablet (80 mg total) by mouth daily.  . baclofen (LIORESAL) 10 MG tablet Take 10 mg by mouth 2 (two) times daily.  . carvedilol (COREG) 6.25 MG tablet Take 1 tablet (6.25 mg total) by mouth 2 (two) times daily with a meal.  . Cholecalciferol (VITAMIN D) 2000 units CAPS Take by mouth daily.  . Cyanocobalamin 1000 MCG/ML KIT Inject as directed every 30 (thirty) days.  . enalapril (VASOTEC) 5 MG tablet Take 1 tablet (5 mg total) by mouth 2 (two) times daily.  Marland Kitchen esomeprazole (NEXIUM) 40 MG capsule Take 40 mg by mouth daily at 12 noon.  . furosemide (LASIX) 40 MG tablet TAKE 1 TAB DAILY FOR 4 DAYS THEN AS NEEDED  . HYDROcodone-acetaminophen (NORCO) 7.5-325 MG tablet Take 1 tablet by mouth 2 (two) times daily.  Marland Kitchen lidocaine (LIDODERM) 5 % Place 3 patches onto the skin daily. Remove & Discard patch within 12 hours or as directed by MD  . metFORMIN (GLUCOPHAGE) 500 MG tablet Take 500 mg by mouth daily with breakfast.  . nitroGLYCERIN (NITROSTAT) 0.4 MG SL tablet Place 1 tablet (0.4 mg total) under  the tongue every 5 (five) minutes as needed for chest pain.  . potassium chloride SA (K-DUR,KLOR-CON) 20 MEQ tablet Take 1 tablet (20 mEq total) by mouth as directed. On the days that you take your as needed Lasix  . topiramate (TOPAMAX) 100 MG tablet Take 100 mg by mouth daily.  . traZODone (DESYREL) 50 MG tablet Take 50 mg by mouth at bedtime.  Marland Kitchen venlafaxine (EFFEXOR) 75 MG tablet Take 75 mg by mouth at bedtime.   . [DISCONTINUED] spironolactone (ALDACTONE) 25 MG tablet Take 0.5 tablets  (12.5 mg total) by mouth daily.  . Na Sulfate-K Sulfate-Mg Sulf (SUPREP BOWEL PREP KIT) 17.5-3.13-1.6 GM/180ML SOLN Take 1 kit by mouth as directed.   No facility-administered encounter medications on file as of 09/06/2016.    Allergies  Allergen Reactions  . Clindamycin/Lincomycin Itching and Rash    Had to go into ER   . Bactrim [Sulfamethoxazole-Trimethoprim] Rash  . Morphine And Related     Makes me sick    Patient Active Problem List   Diagnosis Date Noted  . Sinus tachycardia 05/05/2014  . Cardiomyopathy, ischemic - status post anterior STEMI with EF 40% 05/05/2014  . Acute combined systolic and diastolic heart failure ([place]) 05/05/2014  . Hyperlipidemia with target LDL less than 70 05/05/2014  . Current smoker 05/05/2014  . Essential hypertension   . ST elevation (STEMI) myocardial infarction involving left anterior descending coronary artery ([place])   . GERD 07/21/2008  . DIVERTICULOSIS-COLON 07/21/2008  . FLATULENCE-GAS-BLOATING 07/21/2008  . DIARRHEA 07/21/2008  . ABDOMINAL PAIN-GENERALIZED 07/21/2008  . ABDOMINAL PAIN-MULTIPLE SITES 07/21/2008  . PERSONAL HX COLONIC POLYPS 07/21/2008   Social History   Social History  . Marital status: Married    Spouse name: N/A  . Number of children: N/A  . Years of education: N/A   Occupational History  . Not on file.   Social History Main Topics  . Smoking status: Former Smoker    Packs/day: 1.50    Years: 48.00    Types: Cigarettes    Start date: 05/09/1966    Quit date: 05/01/2014  . Smokeless tobacco: Never Used  . Alcohol use No  . Drug use: No  . Sexual activity: Yes    Birth control/ protection: Surgical   Other Topics Concern  . Not on file   Social History Narrative  . No narrative on file    Casey Avila's family history includes Alcoholism in her father; Anuerysm in her mother and other.      Objective:    Vitals:   09/06/16 1005  BP: 110/60  Pulse: 93    Physical Exam  Well-developed older  white female, pleasant accompanied by her husband. Blood pressure 110/60 pulse 93, height 5 foot 4, weight 202 BMI 34.6. HEENT ;nontraumatic normocephalic EOMI PERRLA sclera anicteric, Cardiovascular; regular rate and rhythm with S1-S2, Pulmonary; clear bilaterally, Abdomen ;soft she has some tenderness across the upper abdomen and into the left upper quadrant left mid quadrant there is no guarding or rebound no palpable mass or hepatosplenomegaly bowel sounds are present, Rectal; exam not done, Extremities; noclubbing cyanosis or edema skin warm and dry, Neuropsych ;mood and affect appropriate       Assessment & Plan:   #85 71 year old female with several month history of upper abdominal pain left upper quadrant and left mid quadrant pain, abdominal bloating and burning. She relates chronically loose stools which are somewhat controlled by her chronic narcotic intake. Patient has very remote history of diagnosis of  ulcerative colitis in the 1980s, and also peptic ulcer disease. As far as I can tell she has not had any active colitis or evidence of colitis since 2005 at which time she had colonoscopy. Etiology of her current pain is not clear, rule out chronic gastritis, peptic ulcer disease, occult colon lesion, symptomatic diverticulosis, #2 hypertension #3 ischemic cardiomyopathy last EF 50-55% #4 coronary artery disease status post MI/drug-eluting stent LAD #5 of onset diabetes mellitus #6 chronic pain syndrome/chronic back pain on multiple medications  Plan; Continue Nexium 40 mg by mouth every morning CBC with differential, see met and lipase We'll schedule for colonoscopy and EGD with Dr. Ardis Hughs. Both procedures discussed in detail with the patient and her husband and they're agreeable to proceed.  (husband is a patient of Dr. Ardis Hughs also) If endoscopic evaluation unrevealing she will need CT imaging.   Addendum; the records received from prior colonoscopy done by Dr. Doristine Mango  [facility] July 2014 with finding of a nonbleeding AVM in the cecum, 2 polyps were removed. One was a tubular adenoma, the other a hyperplastic polyp.-Reports will be scanned.  Casey Genia Harold PA-C 09/06/2016   Cc: Monico Blitz, MD

## 2016-09-06 NOTE — Progress Notes (Signed)
I agree with the above note, plan 

## 2016-09-06 NOTE — Patient Instructions (Addendum)
Your physician has requested that you go to the basement for lab work before leaving today.  You have been scheduled for an endoscopy and colonoscopy. Please follow the written instructions given to you at your visit today. Please pick up your prep supplies at the pharmacy within the next 1-3 days. If you use inhalers (even only as needed), please bring them with you on the day of your procedure. Your physician has requested that you go to www.startemmi.com and enter the access code given to you at your visit today. This web site gives a general overview about your procedure. However, you should still follow specific instructions given to you by our office regarding your preparation for the procedure.  Continue Nexium 40 mg every morning.

## 2016-09-14 DIAGNOSIS — M5416 Radiculopathy, lumbar region: Secondary | ICD-10-CM | POA: Diagnosis not present

## 2016-09-14 DIAGNOSIS — M5440 Lumbago with sciatica, unspecified side: Secondary | ICD-10-CM | POA: Diagnosis not present

## 2016-09-14 DIAGNOSIS — M546 Pain in thoracic spine: Secondary | ICD-10-CM | POA: Diagnosis not present

## 2016-09-14 DIAGNOSIS — Z6834 Body mass index (BMI) 34.0-34.9, adult: Secondary | ICD-10-CM | POA: Diagnosis not present

## 2016-09-20 DIAGNOSIS — L570 Actinic keratosis: Secondary | ICD-10-CM | POA: Diagnosis not present

## 2016-09-20 DIAGNOSIS — L821 Other seborrheic keratosis: Secondary | ICD-10-CM | POA: Diagnosis not present

## 2016-09-20 DIAGNOSIS — D485 Neoplasm of uncertain behavior of skin: Secondary | ICD-10-CM | POA: Diagnosis not present

## 2016-09-29 DIAGNOSIS — Z299 Encounter for prophylactic measures, unspecified: Secondary | ICD-10-CM | POA: Diagnosis not present

## 2016-09-29 DIAGNOSIS — I671 Cerebral aneurysm, nonruptured: Secondary | ICD-10-CM | POA: Diagnosis not present

## 2016-09-29 DIAGNOSIS — J449 Chronic obstructive pulmonary disease, unspecified: Secondary | ICD-10-CM | POA: Diagnosis not present

## 2016-09-29 DIAGNOSIS — Z6834 Body mass index (BMI) 34.0-34.9, adult: Secondary | ICD-10-CM | POA: Diagnosis not present

## 2016-09-29 DIAGNOSIS — E669 Obesity, unspecified: Secondary | ICD-10-CM | POA: Diagnosis not present

## 2016-09-29 DIAGNOSIS — E1165 Type 2 diabetes mellitus with hyperglycemia: Secondary | ICD-10-CM | POA: Diagnosis not present

## 2016-09-29 DIAGNOSIS — M8580 Other specified disorders of bone density and structure, unspecified site: Secondary | ICD-10-CM | POA: Diagnosis not present

## 2016-09-29 DIAGNOSIS — E7800 Pure hypercholesterolemia, unspecified: Secondary | ICD-10-CM | POA: Diagnosis not present

## 2016-09-29 DIAGNOSIS — I10 Essential (primary) hypertension: Secondary | ICD-10-CM | POA: Diagnosis not present

## 2016-09-29 DIAGNOSIS — K5190 Ulcerative colitis, unspecified, without complications: Secondary | ICD-10-CM | POA: Diagnosis not present

## 2016-09-29 DIAGNOSIS — E1142 Type 2 diabetes mellitus with diabetic polyneuropathy: Secondary | ICD-10-CM | POA: Diagnosis not present

## 2016-09-29 DIAGNOSIS — I2510 Atherosclerotic heart disease of native coronary artery without angina pectoris: Secondary | ICD-10-CM | POA: Diagnosis not present

## 2016-10-12 ENCOUNTER — Ambulatory Visit: Payer: Medicare Other | Admitting: Cardiology

## 2016-10-12 NOTE — Progress Notes (Deleted)
Clinical Summary [name] is a 71 y.o.female seen today for follow up of the following medical problems.    1. CAD/ICM - admitted with anterior STEMI 05/02/14, s/p DES to LAD. LVEF 40% by echo at that time. Repeat echo 07/2014 shows LVEF 55-60%.  - recent episodes of diaphoresis that occurs with activity. +SOB, no chest pain. No palpitations. Stops and rests, symptoms improve.  - started about 1 month. Approx 3-4 episodes since that time.  - she completed a nuclear stress test which showed no significant ischemia.     - had some orthostatic symptoms last visit, was instructed to cut back on her prn lasix. Goal weight probable around 205 as opposed to low 200s. - epigastric pain at times, better with eating. No exertional chest pain.  - compliant with meds. Takes lasix prn, about 2-3 times a month. Orthostatic symptoms improved since last visit.   2. HTN - home bp's around 110-120s/60-70s.  3. Fatigue - feels tired and sleepy - typically goes to bed around 11pm, wakes up 5-6AM.  - denies any snoring, no apneic episodes - she thinks could be related to her vitamin B12 which has been a problem in th epast  4. Hyperlipidemia - 11/2015 TC 115 TG 210 HDL 45 LDL 28  - compliant with statin   Past Medical History:  Diagnosis Date  . Aneurysm (Casey Avila)    brain aneurysms x 2 x several yrs  . Coronary artery disease   . DDD (degenerative disc disease), cervical   . Diabetes mellitus without complication (Casey Avila)   . HTN (hypertension)   . Ischemic cardiomyopathy 05/02/2014  . Osteoarthritis   . ST elevation (STEMI) myocardial infarction involving left anterior descending coronary artery (Casey Avila) 05/02/2014  . Ulcerative colitis (Casey Avila)      Allergies  Allergen Reactions  . Clindamycin/Lincomycin Itching and Rash    Had to go into ER   . Bactrim [Sulfamethoxazole-Trimethoprim] Rash  . Morphine And Related     Makes me sick      Current Outpatient Prescriptions    Medication Sig Dispense Refill  . aspirin 81 MG chewable tablet Chew 1 tablet (81 mg total) by mouth daily.    Marland Kitchen atorvastatin (LIPITOR) 80 MG tablet Take 1 tablet (80 mg total) by mouth daily. 90 tablet 3  . baclofen (LIORESAL) 10 MG tablet Take 10 mg by mouth 2 (two) times daily.    . carvedilol (COREG) 6.25 MG tablet Take 1 tablet (6.25 mg total) by mouth 2 (two) times daily with a meal. 180 tablet 3  . Cholecalciferol (VITAMIN D) 2000 units CAPS Take by mouth daily.    . Cyanocobalamin 1000 MCG/ML KIT Inject as directed every 30 (thirty) days.    . enalapril (VASOTEC) 5 MG tablet Take 1 tablet (5 mg total) by mouth 2 (two) times daily. 180 tablet 3  . esomeprazole (NEXIUM) 40 MG capsule Take 40 mg by mouth daily at 12 noon.    . furosemide (LASIX) 40 MG tablet TAKE 1 TAB DAILY FOR 4 DAYS THEN AS NEEDED 90 tablet 3  . HYDROcodone-acetaminophen (NORCO) 7.5-325 MG tablet Take 1 tablet by mouth 2 (two) times daily.  0  . lidocaine (LIDODERM) 5 % Place 3 patches onto the skin daily. Remove & Discard patch within 12 hours or as directed by MD    . metFORMIN (GLUCOPHAGE) 500 MG tablet Take 500 mg by mouth daily with breakfast.    . Na Sulfate-K Sulfate-Mg Sulf (SUPREP BOWEL  PREP KIT) 17.5-3.13-1.6 GM/180ML SOLN Take 1 kit by mouth as directed. 324 mL 0  . nitroGLYCERIN (NITROSTAT) 0.4 MG SL tablet Place 1 tablet (0.4 mg total) under the tongue every 5 (five) minutes as needed for chest pain. 25 tablet 3  . potassium chloride SA (Casey Avila,Casey Avila) 20 MEQ tablet Take 1 tablet (20 mEq total) by mouth as directed. On the days that you take your as needed Lasix 90 tablet 0  . topiramate (TOPAMAX) 100 MG tablet Take 100 mg by mouth daily.    . traZODone (DESYREL) 50 MG tablet Take 50 mg by mouth at bedtime.    Marland Kitchen venlafaxine (EFFEXOR) 75 MG tablet Take 75 mg by mouth at bedtime.      No current facility-administered medications for this visit.      Past Surgical History:  Procedure Laterality Date  .  ABDOMINAL HYSTERECTOMY    . BLADDER REMOVAL    . CARDIAC CATHETERIZATION    . LEFT HEART CATHETERIZATION WITH CORONARY ANGIOGRAM N/A 05/02/2014   Procedure: LEFT HEART CATHETERIZATION WITH CORONARY ANGIOGRAM;  Surgeon: Casey Booze, MD;  Location: [facility];  Service: Cardiovascular;  Laterality: N/A;  . NECK SURGERY    . PTCA       Allergies  Allergen Reactions  . Clindamycin/Lincomycin Itching and Rash    Had to go into ER   . Bactrim [Sulfamethoxazole-Trimethoprim] Rash  . Morphine And Related     Makes me sick       Family History  Problem Relation Age of Onset  . Anuerysm Mother   . Alcoholism Father   . Anuerysm Other      Social History [name] reports that she quit smoking about 2 years ago. Her smoking use included Cigarettes. She started smoking about 50 years ago. She has a 72.00 pack-year smoking history. She has never used smokeless tobacco. [name] reports that she does not drink alcohol.   Review of Systems CONSTITUTIONAL: No weight loss, fever, chills, weakness or fatigue.  HEENT: Eyes: No visual loss, blurred vision, double vision or yellow sclerae.No hearing loss, sneezing, congestion, runny nose or sore throat.  SKIN: No rash or itching.  CARDIOVASCULAR:  RESPIRATORY: No shortness of breath, cough or sputum.  GASTROINTESTINAL: No anorexia, nausea, vomiting or diarrhea. No abdominal pain or blood.  GENITOURINARY: No burning on urination, no polyuria NEUROLOGICAL: No headache, dizziness, syncope, paralysis, ataxia, numbness or tingling in the extremities. No change in bowel or bladder control.  MUSCULOSKELETAL: No muscle, back pain, joint pain or stiffness.  LYMPHATICS: No enlarged nodes. No history of splenectomy.  PSYCHIATRIC: No history of depression or anxiety.  ENDOCRINOLOGIC: No reports of sweating, cold or heat intolerance. No polyuria or polydipsia.  Marland Kitchen   Physical Examination There were no vitals filed for this visit. There  were no vitals filed for this visit.  Gen: resting comfortably, no acute distress HEENT: no scleral icterus, pupils equal round and reactive, no palptable cervical adenopathy,  CV Resp: Clear to auscultation bilaterally GI: abdomen is soft, non-tender, non-distended, normal bowel sounds, no hepatosplenomegaly MSK: extremities are warm, no edema.  Skin: warm, no rash Neuro:  no focal deficits Psych: appropriate affect   Diagnostic Studies 04/2014 Echo Study Conclusions  - Left ventricle: Can not rule out apical clot. Severe hypokinesis mid/apical inferior segments, mid/apical anterior segments, mid anteroseptal segment. The cavity size was normal. Wall thickness was increased in a pattern of mild LVH. The estimated ejection fraction was 40%. Doppler parameters are consistent  with abnormal left ventricular relaxation (grade 1 diastolic dysfunction). - Right ventricle: The cavity size was normal. Systolic function was normal.  04/2014 Cath  HEMODYNAMICS: Aortic pressure was 132/67; LV pressure was 136/19; LVEDP 34. There was no gradient between the left ventricle and aorta.   ANGIOGRAPHIC DATA: The left main coronary artery is widely patent.  The left anterior descending artery is a large vessel. The vessel is occluded in the midportion. After intervention, it was noted that there was disease just past the occlusion. The remainder of the mid to distal LAD was widely patent. There were several small diagonals which were patent.  The left circumflex artery is a large vessel with mild irregularities proximally. There is a large first obtuse marginal which is widely patent. The second obtuse marginal is large and widely patent.  The right coronary artery is a large dominant vessel. In the mid vessel, there is an eccentric 40% stenosis. The posterior descending artery is large and widely patent. The posterior lateral artery is large and widely patent.  LEFT  VENTRICULOGRAM: Left ventricular angiogram was not done due to elevated LVEDP. LVEDP was 34 mmHg.  PCI NARRATIVE: A CLS 3.0 guiding catheter was used to engage the left main. A pro-water wire was placed across the area disease in the LAD. A fetch thrombectomy catheter was used to perform aspiration thrombectomy. There was significant thrombus removal. A second pass was performed with even more thrombus removal. TIMI-3 flow was restored after the first pass. There was a residual mid vessel lesion which was about 12 mm in length, up to 80%. Just past this, there was another segment of moderate disease. The more proximal area was ballooned with a 2.5 x 12 balloon. The entire area was stented with a 2.75 x 28 Promus drug-eluting stent, postdilated to greater than 3 mm in diameter. There was some sluggish flow in the very apical LAD. Multiple doses of IC adenosine were given. TIMI-3 flow was present at the end of the procedure. The patient was pain-free.  IMPRESSIONS:  1. Normal left main coronary artery. 2. Occluded mid left anterior descending artery which was the culprit for today's presentation. This was successfully treated with a 2.75 x 28 Promus drug-eluting stent, postdilated to greater than 3 mm in diameter. 3. Mild disease in the left circumflex artery and its branches. 4. Mild to moderate disease in the right coronary artery. 5. Left ventricular systolic function was not assessed. LVEDP 34 mmHg.  RECOMMENDATION: We'll watch her in the ICU. Start low-dose beta blocker. Start high-dose statin. She needs aggressive secondary prevention. Of note, during the case, we checked about the safety of dual antiplatelet therapy with Dr. Ellene Route, as the patient has small brain aneurysms per her report. She was thought to be appropriate for prolonged dual antiplatelet therapy. She will need dual antiplatelet therapy for at least a year. Check echocardiogram to evaluate LV function.    07/2014  Echo Study Conclusions  - Left ventricle: The cavity size was normal. Wall thickness was normal. Systolic function was normal. The estimated ejection fraction was in the range of 55% to 60%. There is mild hypokinesis of the mid-apicalanteroseptal myocardium. Doppler parameters are consistent with abnormal left ventricular relaxation (grade 1 diastolic dysfunction). Doppler parameters are consistent with elevated ventricular end-diastolic filling pressure. - Aortic valve: Mildly calcified annulus. Trileaflet. - Left atrium: The atrium was mildly dilated. - Right atrium: Central venous pressure (est): 3 mm Hg. - Atrial septum: There was increased thickness of the septum,  consistent with lipomatous hypertrophy. - Pulmonary arteries: Systolic pressure could not be accurately estimated. - Pericardium, extracardiac: There was no pericardial effusion.  Impressions:  - Normal LV wall thickness with LVEF 55-60%, mild mid to apical anteroseptal hypokinesis, and grade 1 diastolic dysfunction. There has been improvement in LVEF compared to the prior study from December 2015. Mild left atrial enlargement. Unable to assess PASP.   11/2015 Nuclear Stress  There was no ST segment deviation noted during stress.  The study is normal. There are no perfusion defects consistent with prior infarction or current ischemia.  This is a low risk study.  The left ventricular ejection fraction is normal (55-65%).     Assessment and Plan  1. CAD/ICM -no recent symptoms, continue current meds  2. HTN - at goal, continue current meds  3. Fatigue - generalized fatigue, symptoms are not cardiac - she is to discuss with pcp  4. Hyperlipidemia - at goal, continue statin   F/u 6 months      Arnoldo Lenis, M.D., F.A.C.C.

## 2016-10-24 ENCOUNTER — Encounter: Payer: Self-pay | Admitting: Gastroenterology

## 2016-10-24 ENCOUNTER — Ambulatory Visit (AMBULATORY_SURGERY_CENTER): Payer: Medicare Other | Admitting: Gastroenterology

## 2016-10-24 VITALS — BP 146/72 | HR 81 | Temp 98.2°F | Resp 18 | Ht 64.0 in | Wt 202.0 lb

## 2016-10-24 DIAGNOSIS — K2970 Gastritis, unspecified, without bleeding: Secondary | ICD-10-CM

## 2016-10-24 DIAGNOSIS — D128 Benign neoplasm of rectum: Secondary | ICD-10-CM

## 2016-10-24 DIAGNOSIS — Z86010 Personal history of colonic polyps: Secondary | ICD-10-CM | POA: Diagnosis not present

## 2016-10-24 DIAGNOSIS — R1012 Left upper quadrant pain: Secondary | ICD-10-CM

## 2016-10-24 DIAGNOSIS — K621 Rectal polyp: Secondary | ICD-10-CM

## 2016-10-24 DIAGNOSIS — K2990 Gastroduodenitis, unspecified, without bleeding: Secondary | ICD-10-CM

## 2016-10-24 DIAGNOSIS — R109 Unspecified abdominal pain: Secondary | ICD-10-CM | POA: Diagnosis not present

## 2016-10-24 DIAGNOSIS — I252 Old myocardial infarction: Secondary | ICD-10-CM | POA: Diagnosis not present

## 2016-10-24 DIAGNOSIS — R197 Diarrhea, unspecified: Secondary | ICD-10-CM | POA: Diagnosis not present

## 2016-10-24 DIAGNOSIS — I2510 Atherosclerotic heart disease of native coronary artery without angina pectoris: Secondary | ICD-10-CM | POA: Diagnosis not present

## 2016-10-24 DIAGNOSIS — K529 Noninfective gastroenteritis and colitis, unspecified: Secondary | ICD-10-CM

## 2016-10-24 DIAGNOSIS — I10 Essential (primary) hypertension: Secondary | ICD-10-CM | POA: Diagnosis not present

## 2016-10-24 MED ORDER — SODIUM CHLORIDE 0.9 % IV SOLN: 500.0000 mL | INTRAVENOUS | Status: DC

## 2016-10-24 NOTE — Progress Notes (Signed)
Called to room to assist during endoscopic procedure.  Patient ID and intended procedure confirmed with present staff. Received instructions for my participation in the procedure from the performing physician.  

## 2016-10-24 NOTE — Op Note (Signed)
[place] Patient Name: Casey Avila Procedure Date: 10/24/2016 1:25 PM MRN: [id number] Endoscopist: Milus Banister , MD Age: 71 Referring MD:  Date of Birth: 21-Feb-1946 Gender: Female Account #: [id number] Procedure:                Colonoscopy Indications:              Clinically significant diarrhea of unexplained                            origin (chronic loose stools, remote possible                            history of IBD/UC), colonoscopy Dr. Britta Mccreedy 2014 for                            heme + stools found 2 subCM polyps, one was                            adenomatous Medicines:                Monitored Anesthesia Care Procedure:                Pre-Anesthesia Assessment:                           - Prior to the procedure, a History and Physical                            was performed, and patient medications and                            allergies were reviewed. The patient's tolerance of                            previous anesthesia was also reviewed. The risks                            and benefits of the procedure and the sedation                            options and risks were discussed with the patient.                            All questions were answered, and informed consent                            was obtained. Prior Anticoagulants: The patient has                            taken no previous anticoagulant or antiplatelet                            agents. ASA Grade Assessment: III - A patient with  severe systemic disease. After reviewing the risks                            and benefits, the patient was deemed in                            satisfactory condition to undergo the procedure.                           After obtaining informed consent, the colonoscope                            was passed under direct vision. Throughout the                            procedure, the patient's blood pressure, pulse, and                        oxygen saturations were monitored continuously. The                            Colonoscope was introduced through the anus and                            advanced to the the terminal ileum. The colonoscopy                            was performed without difficulty. The patient                            tolerated the procedure well. The colonoscopy was                            performed without difficulty. The patient tolerated                            the procedure well. The quality of the bowel                            preparation was good. The terminal ileum, ileocecal                            valve, appendiceal orifice, and rectum were                            photographed. Scope In: 1:39:02 PM Scope Out: 1:53:56 PM Scope Withdrawal Time: 0 hours 10 minutes 3 seconds  Total Procedure Duration: 0 hours 14 minutes 54 seconds  Findings:                 The terminal ileum appeared normal.                           Medium sized cecal AVM.  A 2 mm polyp was found in the rectum. The polyp was                            sessile. The polyp was removed with a cold snare.                            Resection and retrieval were complete.                           The exam was otherwise without abnormality on                            direct and retroflexion views.                           Biopsies for histology were taken with a cold                            forceps from the entire colon for evaluation of                            microscopic colitis. Complications:            No immediate complications. Estimated blood loss:                            None. Estimated Blood Loss:     Estimated blood loss: none. Impression:               - The examined portion of the ileum was normal.                           - Medium sized cecal AVM.                           - One 2 mm polyp in the rectum, removed with a cold                             snare. Resected and retrieved.                           - The examination was otherwise normal on direct                            and retroflexion views.                           - Biopsies were taken with a cold forceps from the                            entire colon for evaluation of microscopic colitis. Recommendation:           - Patient has a contact number available for  emergencies. The signs and symptoms of potential                            delayed complications were discussed with the                            patient. Return to normal activities tomorrow.                            Written discharge instructions were provided to the                            patient.                           - Resume previous diet.                           - Continue present medications.                           - Repeat colonoscopy is recommended. The                            colonoscopy date will be determined after pathology                            results from today's exam become available for                            review.                           -                           You will receive a letter within 2-3 weeks with the                            pathology results and my final recommendations.                           If the polyp(s) is proven to be 'pre-cancerous' on                            pathology, you will need repeat colonoscopy in 5                            years. If the polyp(s) is NOT 'precancerous' on                            pathology then you should repeat colon cancer                            screening in 10 years with colonoscopy without need  for colon cancer screening by any method prior to                            then (including stool testing). Milus Banister, MD 10/24/2016 2:04:35 PM This report has been signed electronically.

## 2016-10-24 NOTE — Progress Notes (Signed)
Report given to PACU, vss 

## 2016-10-24 NOTE — Patient Instructions (Signed)
YOU HAD AN ENDOSCOPIC PROCEDURE TODAY AT [facility]:   Refer to the procedure report that was given to you for any specific questions about what was found during the examination.  If the procedure report does not answer your questions, please call your gastroenterologist to clarify.  If you requested that your care partner not be given the details of your procedure findings, then the procedure report has been included in a sealed envelope for you to review at your convenience later.  YOU SHOULD EXPECT: Some feelings of bloating in the abdomen. Passage of more gas than usual.  Walking can help get rid of the air that was put into your GI tract during the procedure and reduce the bloating. If you had a lower endoscopy (such as a colonoscopy or flexible sigmoidoscopy) you may notice spotting of blood in your stool or on the toilet paper. If you underwent a bowel prep for your procedure, you may not have a normal bowel movement for a few days.  Please Note:  You might notice some irritation and congestion in your nose or some drainage.  This is from the oxygen used during your procedure.  There is no need for concern and it should clear up in a day or so.  SYMPTOMS TO REPORT IMMEDIATELY:   Following lower endoscopy (colonoscopy or flexible sigmoidoscopy):  Excessive amounts of blood in the stool  Significant tenderness or worsening of abdominal pains  Swelling of the abdomen that is new, acute  Fever of 100F or higher   Following upper endoscopy (EGD)  Vomiting of blood or coffee ground material  New chest pain or pain under the shoulder blades  Painful or persistently difficult swallowing  New shortness of breath  Fever of 100F or higher  Black, tarry-looking stools  For urgent or emergent issues, a gastroenterologist can be reached at any hour by calling [phone].   DIET:  We do recommend a small meal at first, but then you may proceed to your regular diet.  Drink  plenty of fluids but you should avoid alcoholic beverages for 24 hours.  ACTIVITY:  You should plan to take it easy for the rest of today and you should NOT DRIVE or use heavy machinery until tomorrow (because of the sedation medicines used during the test).    FOLLOW UP: Our staff will call the number listed on your records the next business day following your procedure to check on you and address any questions or concerns that you may have regarding the information given to you following your procedure. If we do not reach you, we will leave a message.  However, if you are feeling well and you are not experiencing any problems, there is no need to return our call.  We will assume that you have returned to your regular daily activities without incident.  If any biopsies were taken you will be contacted by phone or by letter within the next 1-3 weeks.  Please call us at [phone] if you have not heard about the biopsies in 3 weeks.  Biopsies of the stomach were to rule out a bacteria.  If that is positive, we will call you. You will need antibiotics.   We also biopsied your colon to rule out microscopic colitis.   SIGNATURES/CONFIDENTIALITY: You and/or your care partner have signed paperwork which will be entered into your electronic medical record.  These signatures attest to the fact that that the information above on your After Visit  Summary has been reviewed and is understood.  Full responsibility of the confidentiality of this discharge information lies with you and/or your care-partner.  Read all handouts given to you by your recovery room nurse.

## 2016-10-24 NOTE — Op Note (Signed)
[place] Patient Name: Casey Avila Procedure Date: 10/24/2016 1:25 PM MRN: [id number] Endoscopist: Milus Banister , MD Age: 71 Referring MD:  Date of Birth: 1946-01-09 Gender: Female Account #: [id number] Procedure:                Upper GI endoscopy Indications:              Abdominal pain in the left upper quadrant Medicines:                Monitored Anesthesia Care Procedure:                Pre-Anesthesia Assessment:                           - Prior to the procedure, a History and Physical                            was performed, and patient medications and                            allergies were reviewed. The patient's tolerance of                            previous anesthesia was also reviewed. The risks                            and benefits of the procedure and the sedation                            options and risks were discussed with the patient.                            All questions were answered, and informed consent                            was obtained. Prior Anticoagulants: The patient has                            taken no previous anticoagulant or antiplatelet                            agents. ASA Grade Assessment: III - A patient with                            severe systemic disease. After reviewing the risks                            and benefits, the patient was deemed in                            satisfactory condition to undergo the procedure.                           After obtaining informed consent, the endoscope was  passed under direct vision. Throughout the                            procedure, the patient's blood pressure, pulse, and                            oxygen saturations were monitored continuously. The                            Model GIF-[id number]) scope was introduced                            through the mouth, and advanced to the second part                            of  duodenum. The upper GI endoscopy was                            accomplished without difficulty. The patient                            tolerated the procedure well. Scope In: Scope Out: Findings:                 The esophagus was normal.                           Mild inflammation characterized by erythema and                            friability was found in the gastric body. Biopsies                            were taken with a cold forceps for histology.                           The examined duodenum was normal. Complications:            No immediate complications. Estimated blood loss:                            None. Estimated Blood Loss:     Estimated blood loss: none. Impression:               - Normal esophagus.                           - Mild gastritis. Biopsied.                           - Normal examined duodenum. Recommendation:           - Patient has a contact number available for                            emergencies. The signs and symptoms of potential  delayed complications were discussed with the                            patient. Return to normal activities tomorrow.                            Written discharge instructions were provided to the                            patient.                           - Resume previous diet.                           - Continue present medications.                           - Await pathology results. Milus Banister, MD 10/24/2016 2:06:29 PM This report has been signed electronically.

## 2016-10-25 ENCOUNTER — Telehealth: Payer: Self-pay

## 2016-10-25 NOTE — Telephone Encounter (Signed)
  Follow up Call-  Call back number 10/24/2016  Post procedure Call Back phone  # [phone]  Permission to leave phone message Yes  Some recent data might be hidden     Patient questions:  Do you have a fever, pain , or abdominal swelling? No. Pain Score  0 *  Have you tolerated food without any problems? Yes.    Have you been able to return to your normal activities? Yes.    Do you have any questions about your discharge instructions: Diet   No. Medications  No. Follow up visit  No.  Do you have questions or concerns about your Care? No.  Actions: * If pain score is 4 or above: No action needed, pain <4.

## 2016-10-27 ENCOUNTER — Encounter: Payer: Self-pay | Admitting: *Deleted

## 2016-10-27 ENCOUNTER — Ambulatory Visit (INDEPENDENT_AMBULATORY_CARE_PROVIDER_SITE_OTHER): Payer: Medicare Other | Admitting: Cardiology

## 2016-10-27 ENCOUNTER — Encounter: Payer: Self-pay | Admitting: Cardiology

## 2016-10-27 VITALS — BP 129/73 | HR 81 | Ht 64.0 in | Wt 204.2 lb

## 2016-10-27 DIAGNOSIS — E782 Mixed hyperlipidemia: Secondary | ICD-10-CM

## 2016-10-27 DIAGNOSIS — R079 Chest pain, unspecified: Secondary | ICD-10-CM | POA: Diagnosis not present

## 2016-10-27 DIAGNOSIS — I2510 Atherosclerotic heart disease of native coronary artery without angina pectoris: Secondary | ICD-10-CM

## 2016-10-27 DIAGNOSIS — I10 Essential (primary) hypertension: Secondary | ICD-10-CM

## 2016-10-27 NOTE — Patient Instructions (Signed)
Your physician recommends that you schedule a follow-up appointment TO BE DETERMINED AFTER TESTING   Your physician recommends that you continue on your current medications as directed. Please refer to the Current Medication list given to you today.  Your physician has requested that you have en exercise stress myoview. For further information please visit [email or web address]. Please follow instruction sheet, as given.  Thank you for choosing [facility]!!   

## 2016-10-27 NOTE — Progress Notes (Signed)
Clinical Summary Casey Avila is a 71 y.o.female seen today for follow up of the following medical problems.    1. CAD/ICM - admitted with anterior STEMI 05/02/14, s/p DES to LAD. LVEF 40% by echo at that time. Repeat echo 07/2014 shows LVEF 55-60%.    - recent chest pain episodes.  - can be sharp like pain. Squeezing feeling throughout chest and in between shoulder blades, diaphoretic. 5/10 in severity. Felt dizzy. Not positional. Pain lasted about 30 minutes.  - different pain epigastric, burning like pain. Typically comes on at rest. +SOB, nauseous. - no SOB or DOE. Walks on treadmill x 10 minutes.    2. HTN - home bp's around 110-120s/60-70s. - remains compliant with meds   3. Hyperlipidemia - 11/2015 TC 115 TG 210 HDL 45 LDL 28  - compliant with statin    Past Medical History:  Diagnosis Date  . Aneurysm ([place])    brain aneurysms x 2 x several yrs  . Coronary artery disease   . DDD (degenerative disc disease), cervical   . Diabetes mellitus without complication ([place])   . GERD (gastroesophageal reflux disease)   . HTN (hypertension)   . Hyperlipidemia   . Ischemic cardiomyopathy 05/02/2014  . Osteoarthritis   . Osteoporosis   . ST elevation (STEMI) myocardial infarction involving left anterior descending coronary artery ([place]) 05/02/2014  . Ulcerative colitis ([place])      Allergies  Allergen Reactions  . Clindamycin/Lincomycin Itching and Rash    Had to go into ER   . Bactrim [Sulfamethoxazole-Trimethoprim] Rash  . Morphine And Related     Makes me sick      Current Outpatient Prescriptions  Medication Sig Dispense Refill  . aspirin 81 MG chewable tablet Chew 1 tablet (81 mg total) by mouth daily.    Marland Kitchen atorvastatin (LIPITOR) 80 MG tablet Take 1 tablet (80 mg total) by mouth daily. 90 tablet 3  . baclofen (LIORESAL) 10 MG tablet Take 10 mg by mouth 2 (two) times daily.    . carvedilol (COREG) 6.25 MG tablet Take 1 tablet (6.25 mg total) by mouth 2 (two)  times daily with a meal. 180 tablet 3  . Cholecalciferol (VITAMIN D) 2000 units CAPS Take by mouth daily.    . Cyanocobalamin 1000 MCG/ML KIT Inject as directed every 30 (thirty) days.    . enalapril (VASOTEC) 5 MG tablet Take 1 tablet (5 mg total) by mouth 2 (two) times daily. 180 tablet 3  . esomeprazole (NEXIUM) 40 MG capsule Take 40 mg by mouth daily at 12 noon.    . furosemide (LASIX) 40 MG tablet TAKE 1 TAB DAILY FOR 4 DAYS THEN AS NEEDED 90 tablet 3  . HYDROcodone-acetaminophen (NORCO) 7.5-325 MG tablet Take 1 tablet by mouth 2 (two) times daily.  0  . lidocaine (LIDODERM) 5 % Place 3 patches onto the skin daily. Remove & Discard patch within 12 hours or as directed by MD    . metFORMIN (GLUCOPHAGE) 500 MG tablet Take 500 mg by mouth daily with breakfast.    . nitroGLYCERIN (NITROSTAT) 0.4 MG SL tablet Place 1 tablet (0.4 mg total) under the tongue every 5 (five) minutes as needed for chest pain. 25 tablet 3  . potassium chloride SA (K-DUR,KLOR-CON) 20 MEQ tablet Take 1 tablet (20 mEq total) by mouth as directed. On the days that you take your as needed Lasix 90 tablet 0  . topiramate (TOPAMAX) 100 MG tablet Take 100 mg by mouth daily.    Marland Kitchen  traZODone (DESYREL) 50 MG tablet Take 50 mg by mouth at bedtime.    Marland Kitchen venlafaxine (EFFEXOR) 75 MG tablet Take 75 mg by mouth at bedtime.      Current Facility-Administered Medications  Medication Dose Route Frequency Provider Last Rate Last Dose  . 0.9 %  sodium chloride infusion  500 mL Intravenous Continuous Milus Banister, MD         Past Surgical History:  Procedure Laterality Date  . ABDOMINAL HYSTERECTOMY    . BLADDER SUSPENSION    . CARDIAC CATHETERIZATION    . LEFT HEART CATHETERIZATION WITH CORONARY ANGIOGRAM N/A 05/02/2014   Procedure: LEFT HEART CATHETERIZATION WITH CORONARY ANGIOGRAM;  Surgeon: Jettie Booze, MD;  Location: [facility] CATH LAB;  Service: Cardiovascular;  Laterality: N/A;  . NECK SURGERY     x 2  . PTCA        Allergies  Allergen Reactions  . Clindamycin/Lincomycin Itching and Rash    Had to go into ER   . Bactrim [Sulfamethoxazole-Trimethoprim] Rash  . Morphine And Related     Makes me sick       Family History  Problem Relation Age of Onset  . Anuerysm Mother   . Alcoholism Father   . Anuerysm Other   . Colon cancer Son      Social History Ms. Harl reports that she quit smoking about 2 years ago. Her smoking use included Cigarettes. She started smoking about 50 years ago. She has a 72.00 pack-year smoking history. She has never used smokeless tobacco. Ms. Yoshino reports that she does not drink alcohol.   Review of Systems CONSTITUTIONAL: No weight loss, fever, chills, weakness or fatigue.  HEENT: Eyes: No visual loss, blurred vision, double vision or yellow sclerae.No hearing loss, sneezing, congestion, runny nose or sore throat.  SKIN: No rash or itching.  CARDIOVASCULAR: per hpi RESPIRATORY: No shortness of breath, cough or sputum.  GASTROINTESTINAL: No anorexia, nausea, vomiting or diarrhea. No abdominal pain or blood.  GENITOURINARY: No burning on urination, no polyuria NEUROLOGICAL: No headache, dizziness, syncope, paralysis, ataxia, numbness or tingling in the extremities. No change in bowel or bladder control.  MUSCULOSKELETAL: No muscle, back pain, joint pain or stiffness.  LYMPHATICS: No enlarged nodes. No history of splenectomy.  PSYCHIATRIC: No history of depression or anxiety.  ENDOCRINOLOGIC: No reports of sweating, cold or heat intolerance. No polyuria or polydipsia.  Marland Kitchen   Physical Examination Vitals:   10/27/16 1501  BP: 129/73  Pulse: 81   Vitals:   10/27/16 1501  Weight: 204 lb 3.2 oz (92.6 kg)  Height: 5' 4"  (1.626 m)    Gen: resting comfortably, no acute distress HEENT: no scleral icterus, pupils equal round and reactive, no palptable cervical adenopathy,  CV: RRR, no m/r/g no jvd Resp: Clear to auscultation bilaterally GI: abdomen is  soft, non-tender, non-distended, normal bowel sounds, no hepatosplenomegaly MSK: extremities are warm, no edema.  Skin: warm, no rash Neuro:  no focal deficits Psych: appropriate affect   Diagnostic Studies  04/2014 Echo Study Conclusions  - Left ventricle: Can not rule out apical clot. Severe hypokinesis mid/apical inferior segments, mid/apical anterior segments, mid anteroseptal segment. The cavity size was normal. Wall thickness was increased in a pattern of mild LVH. The estimated ejection fraction was 40%. Doppler parameters are consistent with abnormal left ventricular relaxation (grade 1 diastolic dysfunction). - Right ventricle: The cavity size was normal. Systolic function was normal.  04/2014 Cath  HEMODYNAMICS: Aortic pressure was 132/67; LV pressure  was 136/19; LVEDP 34. There was no gradient between the left ventricle and aorta.   ANGIOGRAPHIC DATA: The left main coronary artery is widely patent.  The left anterior descending artery is a large vessel. The vessel is occluded in the midportion. After intervention, it was noted that there was disease just past the occlusion. The remainder of the mid to distal LAD was widely patent. There were several small diagonals which were patent.  The left circumflex artery is a large vessel with mild irregularities proximally. There is a large first obtuse marginal which is widely patent. The second obtuse marginal is large and widely patent.  The right coronary artery is a large dominant vessel. In the mid vessel, there is an eccentric 40% stenosis. The posterior descending artery is large and widely patent. The posterior lateral artery is large and widely patent.  LEFT VENTRICULOGRAM: Left ventricular angiogram was not done due to elevated LVEDP. LVEDP was 34 mmHg.  PCI NARRATIVE: A CLS 3.0 guiding catheter was used to engage the left main. A pro-water wire was placed across the area disease in the LAD.  A fetch thrombectomy catheter was used to perform aspiration thrombectomy. There was significant thrombus removal. A second pass was performed with even more thrombus removal. TIMI-3 flow was restored after the first pass. There was a residual mid vessel lesion which was about 12 mm in length, up to 80%. Just past this, there was another segment of moderate disease. The more proximal area was ballooned with a 2.5 x 12 balloon. The entire area was stented with a 2.75 x 28 Promus drug-eluting stent, postdilated to greater than 3 mm in diameter. There was some sluggish flow in the very apical LAD. Multiple doses of IC adenosine were given. TIMI-3 flow was present at the end of the procedure. The patient was pain-free.  IMPRESSIONS:  1. Normal left main coronary artery. 2. Occluded mid left anterior descending artery which was the culprit for today's presentation. This was successfully treated with a 2.75 x 28 Promus drug-eluting stent, postdilated to greater than 3 mm in diameter. 3. Mild disease in the left circumflex artery and its branches. 4. Mild to moderate disease in the right coronary artery. 5. Left ventricular systolic function was not assessed. LVEDP 34 mmHg.  RECOMMENDATION: We'll watch her in the ICU. Start low-dose beta blocker. Start high-dose statin. She needs aggressive secondary prevention. Of note, during the case, we checked about the safety of dual antiplatelet therapy with Dr. Ellene Route, as the patient has small brain aneurysms per her report. She was thought to be appropriate for prolonged dual antiplatelet therapy. She will need dual antiplatelet therapy for at least a year. Check echocardiogram to evaluate LV function.    07/2014 Echo Study Conclusions  - Left ventricle: The cavity size was normal. Wall thickness was normal. Systolic function was normal. The estimated ejection fraction was in the range of 55% to 60%. There is mild hypokinesis of the  mid-apicalanteroseptal myocardium. Doppler parameters are consistent with abnormal left ventricular relaxation (grade 1 diastolic dysfunction). Doppler parameters are consistent with elevated ventricular end-diastolic filling pressure. - Aortic valve: Mildly calcified annulus. Trileaflet. - Left atrium: The atrium was mildly dilated. - Right atrium: Central venous pressure (est): 3 mm Hg. - Atrial septum: There was increased thickness of the septum, consistent with lipomatous hypertrophy. - Pulmonary arteries: Systolic pressure could not be accurately estimated. - Pericardium, extracardiac: There was no pericardial effusion.  Impressions:  - Normal LV wall thickness with LVEF  55-60%, mild mid to apical anteroseptal hypokinesis, and grade 1 diastolic dysfunction. There has been improvement in LVEF compared to the prior study from December 2015. Mild left atrial enlargement. Unable to assess PASP.   11/2015 Nuclear Stress  There was no ST segment deviation noted during stress.  The study is normal. There are no perfusion defects consistent with prior infarction or current ischemia.  This is a low risk study.  The left ventricular ejection fraction is normal (55-65%).   Assessment and Plan  1. CAD/ICM -recent chest pain symptoms - we will obtain lexiscan to further evaluate  2. HTN - her bp is at goal, she will continue current meds   3. Hyperlipidemia - her lipids are at goal, continue statin      Arnoldo Lenis, M.D.

## 2016-10-28 ENCOUNTER — Telehealth: Payer: Self-pay | Admitting: Cardiology

## 2016-10-28 NOTE — Telephone Encounter (Signed)
Pre-cert Verification for the following procedure    EXERCISE NUCLEAR STRESS TEST DX CHEST PAIN  Scheduled for 11/02/16 at [facility]

## 2016-11-01 ENCOUNTER — Other Ambulatory Visit: Payer: Self-pay

## 2016-11-01 DIAGNOSIS — R1012 Left upper quadrant pain: Secondary | ICD-10-CM

## 2016-11-02 ENCOUNTER — Encounter (HOSPITAL_COMMUNITY): Payer: Self-pay

## 2016-11-02 ENCOUNTER — Encounter (HOSPITAL_COMMUNITY)
Admission: RE | Admit: 2016-11-02 | Discharge: 2016-11-02 | Disposition: A | Discharge status: Home / Self Care | Payer: Medicare Other | Source: Ambulatory Visit | Attending: Cardiology | Admitting: Cardiology

## 2016-11-02 ENCOUNTER — Other Ambulatory Visit (INDEPENDENT_AMBULATORY_CARE_PROVIDER_SITE_OTHER): Payer: Medicare Other

## 2016-11-02 ENCOUNTER — Encounter (HOSPITAL_BASED_OUTPATIENT_CLINIC_OR_DEPARTMENT_OTHER)
Admission: RE | Admit: 2016-11-02 | Discharge: 2016-11-02 | Disposition: A | Discharge status: Home / Self Care | Payer: Medicare Other | Source: Ambulatory Visit | Attending: Cardiology | Admitting: Cardiology

## 2016-11-02 DIAGNOSIS — R079 Chest pain, unspecified: Secondary | ICD-10-CM

## 2016-11-02 DIAGNOSIS — R1012 Left upper quadrant pain: Secondary | ICD-10-CM | POA: Diagnosis not present

## 2016-11-02 LAB — NM MYOCAR MULTI W/SPECT W/WALL MOTION / EF
CHL CUP NUCLEAR SDS: 7
CHL CUP NUCLEAR SRS: 6
CHL CUP RESTING HR STRESS: 87 {beats}/min
CHL RATE OF PERCEIVED EXERTION: 13
CSEPEDS: 0 s
CSEPEW: 4.6 METS
Exercise duration (min): 4 min
LVDIAVOL: 60 mL (ref 46–106)
LVSYSVOL: 22 mL
MPHR: 149 {beats}/min
NUC STRESS TID: 1.04
Peak HR: 144 {beats}/min
Percent HR: 96 %
RATE: 0.25
SSS: 13

## 2016-11-02 LAB — BUN: BUN: 12 mg/dL (ref 6–23)

## 2016-11-02 LAB — CREATININE, SERUM: CREATININE: 0.83 mg/dL (ref 0.40–1.20)

## 2016-11-02 MED ORDER — TECHNETIUM TC 99M TETROFOSMIN IV KIT
30.0000 | PACK | Freq: Once | INTRAVENOUS | Status: AC | PRN
  Administered 2016-11-02: 31 via INTRAVENOUS

## 2016-11-02 MED ORDER — REGADENOSON 0.4 MG/5ML IV SOLN
INTRAVENOUS | Status: DC
Start: 2016-11-02 — End: 2016-11-02
  Filled 2016-11-02: qty 5

## 2016-11-02 MED ORDER — TECHNETIUM TC 99M TETROFOSMIN IV KIT
10.0000 | PACK | Freq: Once | INTRAVENOUS | Status: AC | PRN
  Administered 2016-11-02: 10.5 via INTRAVENOUS

## 2016-11-02 MED ORDER — SODIUM CHLORIDE 0.9% FLUSH
INTRAVENOUS | Status: AC
  Administered 2016-11-02: 10 mL via INTRAVENOUS
  Filled 2016-11-02: qty 10

## 2016-11-03 ENCOUNTER — Telehealth: Payer: Self-pay

## 2016-11-03 MED ORDER — ISOSORBIDE MONONITRATE ER 30 MG PO TB24: 15.0000 mg | ORAL_TABLET | Freq: Every day | ORAL | 3 refills | Status: DC

## 2016-11-03 NOTE — Telephone Encounter (Signed)
Patient notified. Routed to PCP. Appointment scheduled on 12/09/16

## 2016-11-03 NOTE — Telephone Encounter (Signed)
-----   Message from Arnoldo Lenis, MD sent at 11/03/2016  2:18 PM EDT ----- Stress test shows a small possible new blockage. This is not large enough to be of any significant risk but could cause some chest pain. Id like to try treating it with medications initially, if does not work or symptoms progress may need to consider cath. Please start imdur 15mg  daily, f/u 1 month  Zandra Abts MD

## 2016-11-08 ENCOUNTER — Ambulatory Visit (INDEPENDENT_AMBULATORY_CARE_PROVIDER_SITE_OTHER)
Admission: RE | Admit: 2016-11-08 | Discharge: 2016-11-08 | Disposition: A | Discharge status: Home / Self Care | Payer: Medicare Other | Source: Ambulatory Visit | Attending: Gastroenterology | Admitting: Gastroenterology

## 2016-11-08 DIAGNOSIS — R1012 Left upper quadrant pain: Secondary | ICD-10-CM

## 2016-11-08 DIAGNOSIS — K5190 Ulcerative colitis, unspecified, without complications: Secondary | ICD-10-CM | POA: Diagnosis not present

## 2016-11-08 MED ORDER — IOPAMIDOL (ISOVUE-300) INJECTION 61%
100.0000 mL | Freq: Once | INTRAVENOUS | Status: AC | PRN
  Administered 2016-11-08: 100 mL via INTRAVENOUS

## 2016-11-10 ENCOUNTER — Telehealth: Payer: Self-pay | Admitting: Gastroenterology

## 2016-11-10 NOTE — Telephone Encounter (Signed)
Pt was advised that the results have not been reviewed.  We will call her after Dr Ardis Hughs reviews

## 2016-11-15 ENCOUNTER — Telehealth: Payer: Self-pay | Admitting: Cardiology

## 2016-11-15 MED ORDER — CARVEDILOL 6.25 MG PO TABS: 6.2500 mg | ORAL_TABLET | Freq: Two times a day (BID) | ORAL | 1 refills | Status: DC

## 2016-11-15 MED ORDER — SPIRONOLACTONE 25 MG PO TABS
12.5000 mg | ORAL_TABLET | Freq: Every day | ORAL | 1 refills | Status: DC
Start: 2016-11-15 — End: 2017-03-01

## 2016-11-15 NOTE — Telephone Encounter (Signed)
°  1. Which medications need to be refilled? (please list name of each medication and dose if known)  Coreg Spironolacton 25 mg  2. Which pharmacy/location (including street and city if local pharmacy) is medication to be sent to? [place]  3. Do they need a 30 day or 90 day supply?

## 2016-11-15 NOTE — Telephone Encounter (Signed)
Refill both meds please and update med list. She had been on sprionlactone based on prior notes, not sure how it fell off her recent list  J Lashaunta Sicard MD

## 2016-11-15 NOTE — Telephone Encounter (Signed)
Spironolactone and Coreg sent to pharmacy [id number] as requested

## 2016-11-15 NOTE — Telephone Encounter (Signed)
Pt requesting refills on spironolactone and Coreg - Coreg was on pt med list but spironolactone was not at recent 6/7 office visit - pt says she has been taking 12.5 mg daily - will route to provider if ok to refill

## 2016-12-09 ENCOUNTER — Ambulatory Visit (INDEPENDENT_AMBULATORY_CARE_PROVIDER_SITE_OTHER): Payer: Medicare Other | Admitting: Cardiology

## 2016-12-09 ENCOUNTER — Encounter: Payer: Self-pay | Admitting: Cardiology

## 2016-12-09 VITALS — BP 120/74 | HR 102 | Ht 64.0 in | Wt 207.0 lb

## 2016-12-09 DIAGNOSIS — I25119 Atherosclerotic heart disease of native coronary artery with unspecified angina pectoris: Secondary | ICD-10-CM

## 2016-12-09 DIAGNOSIS — I209 Angina pectoris, unspecified: Secondary | ICD-10-CM

## 2016-12-09 MED ORDER — ISOSORBIDE MONONITRATE ER 30 MG PO TB24: 30.0000 mg | ORAL_TABLET | Freq: Every day | ORAL | 1 refills | Status: DC

## 2016-12-09 NOTE — Patient Instructions (Signed)
Your physician recommends that you schedule a follow-up appointment in: 4 MONTHS WITH DR BRANCH  Your physician has recommended you make the following change in your medication:   INCREASE IMDUR 30 MG DAILY  Thank you for choosing [facility]!!     

## 2016-12-09 NOTE — Progress Notes (Signed)
Clinical Summary Ms. Bally is a 71 y.o.female seen today for follow up of the following medical problems. This is a focused visit on her history of CAD and recent chest pain.   1. CAD/ICM - admitted with anterior STEMI 05/02/14, s/p DES to LAD. LVEF 40% by echo at that time. Repeat echo 07/2014 shows LVEF 55-60%.    - recent chest pain episodes.  - can be sharp like pain. Squeezing feeling throughout chest and in between shoulder blades, diaphoretic. 5/10 in severity. Felt dizzy. Not positional. Pain lasted about 30 minutes.  - different pain epigastric, burning like pain. Typically comes on at rest. +SOB, nauseous. - no SOB or DOE. Walks on treadmill x 10 minutes.  10/2016 nuclear stress: prior anterior/anteroseptal scar, mild peri-infarct ischemia. Mild apical ischemia. Low risk study - started imdur 39m daily since last visit, symptoms improved but not resolved.    Past Medical History:  Diagnosis Date  . Aneurysm ([place]    brain aneurysms x 2 x several yrs  . Coronary artery disease   . DDD (degenerative disc disease), cervical   . Diabetes mellitus without complication ([facility]   . GERD (gastroesophageal reflux disease)   . HTN (hypertension)   . Hyperlipidemia   . Ischemic cardiomyopathy 05/02/2014  . Osteoarthritis   . Osteoporosis   . ST elevation (STEMI) myocardial infarction involving left anterior descending coronary artery ([place] 05/02/2014  . Ulcerative colitis ([place]      Allergies  Allergen Reactions  . Clindamycin/Lincomycin Itching and Rash    Had to go into ER   . Bactrim [Sulfamethoxazole-Trimethoprim] Rash  . Morphine And Related     Makes me sick      Current Outpatient Prescriptions  Medication Sig Dispense Refill  . aspirin 81 MG chewable tablet Chew 1 tablet (81 mg total) by mouth daily.    .Marland Kitchenatorvastatin (LIPITOR) 80 MG tablet Take 1 tablet (80 mg total) by mouth daily. 90 tablet 3  . baclofen (LIORESAL) 10 MG tablet Take 10 mg by mouth 2  (two) times daily.    . carvedilol (COREG) 6.25 MG tablet Take 1 tablet (6.25 mg total) by mouth 2 (two) times daily with a meal. 180 tablet 1  . Cholecalciferol (VITAMIN D) 2000 units CAPS Take by mouth daily.    . Cyanocobalamin 1000 MCG/ML KIT Inject as directed every 30 (thirty) days.    . enalapril (VASOTEC) 5 MG tablet Take 1 tablet (5 mg total) by mouth 2 (two) times daily. 180 tablet 3  . esomeprazole (NEXIUM) 40 MG capsule Take 40 mg by mouth daily at 12 noon.    . furosemide (LASIX) 40 MG tablet TAKE 1 TAB DAILY FOR 4 DAYS THEN AS NEEDED 90 tablet 3  . HYDROcodone-acetaminophen (NORCO) 7.5-325 MG tablet Take 1 tablet by mouth 2 (two) times daily.  0  . isosorbide mononitrate (IMDUR) 30 MG 24 hr tablet Take 0.5 tablets (15 mg total) by mouth daily. 90 tablet 3  . lidocaine (LIDODERM) 5 % Place 3 patches onto the skin daily. Remove & Discard patch within 12 hours or as directed by MD    . metFORMIN (GLUCOPHAGE) 500 MG tablet Take 500 mg by mouth daily with breakfast.    . nitroGLYCERIN (NITROSTAT) 0.4 MG SL tablet Place 1 tablet (0.4 mg total) under the tongue every 5 (five) minutes as needed for chest pain. 25 tablet 3  . potassium chloride SA (K-DUR,KLOR-CON) 20 MEQ tablet Take 1 tablet (20 mEq total)  by mouth as directed. On the days that you take your as needed Lasix 90 tablet 0  . spironolactone (ALDACTONE) 25 MG tablet Take 0.5 tablets (12.5 mg total) by mouth daily. 45 tablet 1  . topiramate (TOPAMAX) 100 MG tablet Take 100 mg by mouth daily.    . traZODone (DESYREL) 50 MG tablet Take 50 mg by mouth at bedtime.    Marland Kitchen venlafaxine (EFFEXOR) 75 MG tablet Take 75 mg by mouth at bedtime.      No current facility-administered medications for this visit.      Past Surgical History:  Procedure Laterality Date  . ABDOMINAL HYSTERECTOMY    . BLADDER SUSPENSION    . CARDIAC CATHETERIZATION    . LEFT HEART CATHETERIZATION WITH CORONARY ANGIOGRAM N/A 05/02/2014   Procedure: LEFT HEART  CATHETERIZATION WITH CORONARY ANGIOGRAM;  Surgeon: Jettie Booze, MD;  Location: [facility] CATH LAB;  Service: Cardiovascular;  Laterality: N/A;  . NECK SURGERY     x 2  . PTCA       Allergies  Allergen Reactions  . Clindamycin/Lincomycin Itching and Rash    Had to go into ER   . Bactrim [Sulfamethoxazole-Trimethoprim] Rash  . Morphine And Related     Makes me sick       Family History  Problem Relation Age of Onset  . Anuerysm Mother   . Alcoholism Father   . Anuerysm Other   . Colon cancer Son      Social History Ms. Fitchett reports that she quit smoking about 2 years ago. Her smoking use included Cigarettes. She started smoking about 50 years ago. She has a 72.00 pack-year smoking history. She has never used smokeless tobacco. Ms. Birt reports that she does not drink alcohol.   Review of Systems CONSTITUTIONAL: No weight loss, fever, chills, weakness or fatigue.  HEENT: Eyes: No visual loss, blurred vision, double vision or yellow sclerae.No hearing loss, sneezing, congestion, runny nose or sore throat.  SKIN: No rash or itching.  CARDIOVASCULAR: per hpi RESPIRATORY: No shortness of breath, cough or sputum.  GASTROINTESTINAL: No anorexia, nausea, vomiting or diarrhea. No abdominal pain or blood.  GENITOURINARY: No burning on urination, no polyuria NEUROLOGICAL: No headache, dizziness, syncope, paralysis, ataxia, numbness or tingling in the extremities. No change in bowel or bladder control.  MUSCULOSKELETAL: No muscle, back pain, joint pain or stiffness.  LYMPHATICS: No enlarged nodes. No history of splenectomy.  PSYCHIATRIC: No history of depression or anxiety.  ENDOCRINOLOGIC: No reports of sweating, cold or heat intolerance. No polyuria or polydipsia.  Marland Kitchen   Physical Examination Vitals:   12/09/16 1531  BP: 120/74  Pulse: (!) 102   Vitals:   12/09/16 1531  Weight: 207 lb (93.9 kg)  Height: '5\' 4"'$  (1.626 m)    Gen: resting comfortably, no acute  distress HEENT: no scleral icterus, pupils equal round and reactive, no palptable cervical adenopathy,  CV: RRR, no m/r/g, no jvd Resp: Clear to auscultation bilaterally GI: abdomen is soft, non-tender, non-distended, normal bowel sounds, no hepatosplenomegaly MSK: extremities are warm, no edema.  Skin: warm, no rash Neuro:  no focal deficits Psych: appropriate affect   Diagnostic Studies 04/2014 Echo Study Conclusions  - Left ventricle: Can not rule out apical clot. Severe hypokinesis mid/apical inferior segments, mid/apical anterior segments, mid anteroseptal segment. The cavity size was normal. Wall thickness was increased in a pattern of mild LVH. The estimated ejection fraction was 40%. Doppler parameters are consistent with abnormal left ventricular relaxation (grade 1  diastolic dysfunction). - Right ventricle: The cavity size was normal. Systolic function was normal.  04/2014 Cath  HEMODYNAMICS: Aortic pressure was 132/67; LV pressure was 136/19; LVEDP 34. There was no gradient between the left ventricle and aorta.   ANGIOGRAPHIC DATA: The left main coronary artery is widely patent.  The left anterior descending artery is a large vessel. The vessel is occluded in the midportion. After intervention, it was noted that there was disease just past the occlusion. The remainder of the mid to distal LAD was widely patent. There were several small diagonals which were patent.  The left circumflex artery is a large vessel with mild irregularities proximally. There is a large first obtuse marginal which is widely patent. The second obtuse marginal is large and widely patent.  The right coronary artery is a large dominant vessel. In the mid vessel, there is an eccentric 40% stenosis. The posterior descending artery is large and widely patent. The posterior lateral artery is large and widely patent.  LEFT VENTRICULOGRAM: Left ventricular angiogram was not done  due to elevated LVEDP. LVEDP was 34 mmHg.  PCI NARRATIVE: A CLS 3.0 guiding catheter was used to engage the left main. A pro-water wire was placed across the area disease in the LAD. A fetch thrombectomy catheter was used to perform aspiration thrombectomy. There was significant thrombus removal. A second pass was performed with even more thrombus removal. TIMI-3 flow was restored after the first pass. There was a residual mid vessel lesion which was about 12 mm in length, up to 80%. Just past this, there was another segment of moderate disease. The more proximal area was ballooned with a 2.5 x 12 balloon. The entire area was stented with a 2.75 x 28 Promus drug-eluting stent, postdilated to greater than 3 mm in diameter. There was some sluggish flow in the very apical LAD. Multiple doses of IC adenosine were given. TIMI-3 flow was present at the end of the procedure. The patient was pain-free.  IMPRESSIONS:  1. Normal left main coronary artery. 2. Occluded mid left anterior descending artery which was the culprit for today's presentation. This was successfully treated with a 2.75 x 28 Promus drug-eluting stent, postdilated to greater than 3 mm in diameter. 3. Mild disease in the left circumflex artery and its branches. 4. Mild to moderate disease in the right coronary artery. 5. Left ventricular systolic function was not assessed. LVEDP 34 mmHg.  RECOMMENDATION: We'll watch her in the ICU. Start low-dose beta blocker. Start high-dose statin. She needs aggressive secondary prevention. Of note, during the case, we checked about the safety of dual antiplatelet therapy with Dr. Danielle Dess, as the patient has small brain aneurysms per her report. She was thought to be appropriate for prolonged dual antiplatelet therapy. She will need dual antiplatelet therapy for at least a year. Check echocardiogram to evaluate LV function.    07/2014 Echo Study Conclusions  - Left ventricle: The cavity size  was normal. Wall thickness was normal. Systolic function was normal. The estimated ejection fraction was in the range of 55% to 60%. There is mild hypokinesis of the mid-apicalanteroseptal myocardium. Doppler parameters are consistent with abnormal left ventricular relaxation (grade 1 diastolic dysfunction). Doppler parameters are consistent with elevated ventricular end-diastolic filling pressure. - Aortic valve: Mildly calcified annulus. Trileaflet. - Left atrium: The atrium was mildly dilated. - Right atrium: Central venous pressure (est): 3 mm Hg. - Atrial septum: There was increased thickness of the septum, consistent with lipomatous hypertrophy. - Pulmonary arteries:  Systolic pressure could not be accurately estimated. - Pericardium, extracardiac: There was no pericardial effusion.  Impressions:  - Normal LV wall thickness with LVEF 55-60%, mild mid to apical anteroseptal hypokinesis, and grade 1 diastolic dysfunction. There has been improvement in LVEF compared to the prior study from December 2015. Mild left atrial enlargement. Unable to assess PASP.   11/2015 Nuclear Stress  There was no ST segment deviation noted during stress.  The study is normal. There are no perfusion defects consistent with prior infarction or current ischemia.  This is a low risk study.  The left ventricular ejection fraction is normal (55-65%).   10/2016 Nuclear stress  Blood pressure demonstrated a normal response to exercise.  There was no ST segment deviation noted during stress.  Findings consistent with prior anterior/anteroseptal myocardial infarction with mild peri-infarct ischemia. There is mild apical ischemia  This is a low risk study. Overall small amount of myocardium currently at jeopardy.  The left ventricular ejection fraction is normal (55-65%).  Assessment and Plan  1. CAD/ICM -recent chest pain/anginal symptoms, stress test with mild   ischemia, overall low risk.  - symptoms improved with imdur 15 but not resolved, increase imdur to 90m daily - continue titration of antianginal therapy, if refractory or progressive angina consider cath   F/u 4 months. Patient to [place] if progressive or ongoing symptoms      JArnoldo Lenis M.D

## 2016-12-12 DIAGNOSIS — Z6835 Body mass index (BMI) 35.0-35.9, adult: Secondary | ICD-10-CM | POA: Diagnosis not present

## 2016-12-12 DIAGNOSIS — I10 Essential (primary) hypertension: Secondary | ICD-10-CM | POA: Diagnosis not present

## 2016-12-12 DIAGNOSIS — M546 Pain in thoracic spine: Secondary | ICD-10-CM | POA: Diagnosis not present

## 2016-12-19 DIAGNOSIS — M546 Pain in thoracic spine: Secondary | ICD-10-CM | POA: Diagnosis not present

## 2017-01-02 DIAGNOSIS — I10 Essential (primary) hypertension: Secondary | ICD-10-CM | POA: Diagnosis not present

## 2017-01-02 DIAGNOSIS — R05 Cough: Secondary | ICD-10-CM | POA: Diagnosis not present

## 2017-01-02 DIAGNOSIS — Z6835 Body mass index (BMI) 35.0-35.9, adult: Secondary | ICD-10-CM | POA: Diagnosis not present

## 2017-01-02 DIAGNOSIS — Z87891 Personal history of nicotine dependence: Secondary | ICD-10-CM | POA: Diagnosis not present

## 2017-01-02 DIAGNOSIS — E1165 Type 2 diabetes mellitus with hyperglycemia: Secondary | ICD-10-CM | POA: Diagnosis not present

## 2017-01-02 DIAGNOSIS — I2510 Atherosclerotic heart disease of native coronary artery without angina pectoris: Secondary | ICD-10-CM | POA: Diagnosis not present

## 2017-01-02 DIAGNOSIS — Z299 Encounter for prophylactic measures, unspecified: Secondary | ICD-10-CM | POA: Diagnosis not present

## 2017-01-02 DIAGNOSIS — J449 Chronic obstructive pulmonary disease, unspecified: Secondary | ICD-10-CM | POA: Diagnosis not present

## 2017-01-02 DIAGNOSIS — E7800 Pure hypercholesterolemia, unspecified: Secondary | ICD-10-CM | POA: Diagnosis not present

## 2017-01-02 DIAGNOSIS — E1142 Type 2 diabetes mellitus with diabetic polyneuropathy: Secondary | ICD-10-CM | POA: Diagnosis not present

## 2017-01-17 DIAGNOSIS — Z6835 Body mass index (BMI) 35.0-35.9, adult: Secondary | ICD-10-CM | POA: Diagnosis not present

## 2017-01-17 DIAGNOSIS — I10 Essential (primary) hypertension: Secondary | ICD-10-CM | POA: Diagnosis not present

## 2017-01-17 DIAGNOSIS — M5440 Lumbago with sciatica, unspecified side: Secondary | ICD-10-CM | POA: Diagnosis not present

## 2017-01-17 DIAGNOSIS — M546 Pain in thoracic spine: Secondary | ICD-10-CM | POA: Diagnosis not present

## 2017-01-25 ENCOUNTER — Telehealth: Payer: Self-pay | Admitting: Cardiology

## 2017-01-25 MED ORDER — ENALAPRIL MALEATE 5 MG PO TABS: 5.0000 mg | ORAL_TABLET | Freq: Two times a day (BID) | ORAL | 3 refills | Status: DC

## 2017-01-25 NOTE — Telephone Encounter (Signed)
enalapril (VASOTEC) 5 MG tablet   Please send to [facility]

## 2017-01-25 NOTE — Telephone Encounter (Signed)
Done

## 2017-01-30 DIAGNOSIS — E7800 Pure hypercholesterolemia, unspecified: Secondary | ICD-10-CM | POA: Diagnosis not present

## 2017-01-30 DIAGNOSIS — E1142 Type 2 diabetes mellitus with diabetic polyneuropathy: Secondary | ICD-10-CM | POA: Diagnosis not present

## 2017-01-30 DIAGNOSIS — K5190 Ulcerative colitis, unspecified, without complications: Secondary | ICD-10-CM | POA: Diagnosis not present

## 2017-01-30 DIAGNOSIS — I10 Essential (primary) hypertension: Secondary | ICD-10-CM | POA: Diagnosis not present

## 2017-01-30 DIAGNOSIS — J449 Chronic obstructive pulmonary disease, unspecified: Secondary | ICD-10-CM | POA: Diagnosis not present

## 2017-01-30 DIAGNOSIS — M549 Dorsalgia, unspecified: Secondary | ICD-10-CM | POA: Diagnosis not present

## 2017-01-30 DIAGNOSIS — L719 Rosacea, unspecified: Secondary | ICD-10-CM | POA: Diagnosis not present

## 2017-01-30 DIAGNOSIS — E1165 Type 2 diabetes mellitus with hyperglycemia: Secondary | ICD-10-CM | POA: Diagnosis not present

## 2017-01-30 DIAGNOSIS — I2510 Atherosclerotic heart disease of native coronary artery without angina pectoris: Secondary | ICD-10-CM | POA: Diagnosis not present

## 2017-01-30 DIAGNOSIS — Z299 Encounter for prophylactic measures, unspecified: Secondary | ICD-10-CM | POA: Diagnosis not present

## 2017-01-30 DIAGNOSIS — J029 Acute pharyngitis, unspecified: Secondary | ICD-10-CM | POA: Diagnosis not present

## 2017-01-30 DIAGNOSIS — Z6835 Body mass index (BMI) 35.0-35.9, adult: Secondary | ICD-10-CM | POA: Diagnosis not present

## 2017-02-20 ENCOUNTER — Other Ambulatory Visit: Payer: Self-pay | Admitting: Internal Medicine

## 2017-02-20 DIAGNOSIS — Z1231 Encounter for screening mammogram for malignant neoplasm of breast: Secondary | ICD-10-CM

## 2017-02-22 ENCOUNTER — Ambulatory Visit
Admission: RE | Admit: 2017-02-22 | Discharge: 2017-02-22 | Disposition: A | Discharge status: Home / Self Care | Payer: Medicare Other | Source: Ambulatory Visit | Attending: Internal Medicine | Admitting: Internal Medicine

## 2017-02-22 DIAGNOSIS — Z1231 Encounter for screening mammogram for malignant neoplasm of breast: Secondary | ICD-10-CM | POA: Diagnosis not present

## 2017-02-23 DIAGNOSIS — Z23 Encounter for immunization: Secondary | ICD-10-CM | POA: Diagnosis not present

## 2017-02-27 DIAGNOSIS — H353122 Nonexudative age-related macular degeneration, left eye, intermediate dry stage: Secondary | ICD-10-CM | POA: Diagnosis not present

## 2017-02-27 DIAGNOSIS — D3131 Benign neoplasm of right choroid: Secondary | ICD-10-CM | POA: Diagnosis not present

## 2017-02-27 DIAGNOSIS — H524 Presbyopia: Secondary | ICD-10-CM | POA: Diagnosis not present

## 2017-02-27 DIAGNOSIS — H40013 Open angle with borderline findings, low risk, bilateral: Secondary | ICD-10-CM | POA: Diagnosis not present

## 2017-02-27 DIAGNOSIS — H40002 Preglaucoma, unspecified, left eye: Secondary | ICD-10-CM | POA: Diagnosis not present

## 2017-02-27 DIAGNOSIS — E119 Type 2 diabetes mellitus without complications: Secondary | ICD-10-CM | POA: Diagnosis not present

## 2017-02-27 DIAGNOSIS — Z7984 Long term (current) use of oral hypoglycemic drugs: Secondary | ICD-10-CM | POA: Diagnosis not present

## 2017-02-27 DIAGNOSIS — H2513 Age-related nuclear cataract, bilateral: Secondary | ICD-10-CM | POA: Diagnosis not present

## 2017-02-27 DIAGNOSIS — H353132 Nonexudative age-related macular degeneration, bilateral, intermediate dry stage: Secondary | ICD-10-CM | POA: Diagnosis not present

## 2017-02-27 DIAGNOSIS — H5203 Hypermetropia, bilateral: Secondary | ICD-10-CM | POA: Diagnosis not present

## 2017-02-27 DIAGNOSIS — H353112 Nonexudative age-related macular degeneration, right eye, intermediate dry stage: Secondary | ICD-10-CM | POA: Diagnosis not present

## 2017-02-27 DIAGNOSIS — H40001 Preglaucoma, unspecified, right eye: Secondary | ICD-10-CM | POA: Diagnosis not present

## 2017-03-01 ENCOUNTER — Other Ambulatory Visit: Payer: Self-pay | Admitting: Cardiology

## 2017-03-01 MED ORDER — SPIRONOLACTONE 25 MG PO TABS
12.5000 mg | ORAL_TABLET | Freq: Every day | ORAL | 3 refills | Status: DC
Start: 2017-03-01 — End: 2017-08-02

## 2017-03-01 NOTE — Telephone Encounter (Signed)
° ° ° °  1. Which medications need to be refilled? (please list name of each medication and dose if known) spironolactone (ALDACTONE) 25 MG tablet  2. Which pharmacy/location (including street and city if local pharmacy) is medication to be sent to?  [place]   3. Do they need a 30 day or 90 day supply? 90 day with 1 year prescription

## 2017-03-01 NOTE — Telephone Encounter (Signed)
Medication sent to pharmacy  

## 2017-03-06 DIAGNOSIS — N764 Abscess of vulva: Secondary | ICD-10-CM | POA: Diagnosis not present

## 2017-03-06 DIAGNOSIS — Z299 Encounter for prophylactic measures, unspecified: Secondary | ICD-10-CM | POA: Diagnosis not present

## 2017-03-06 DIAGNOSIS — M79644 Pain in right finger(s): Secondary | ICD-10-CM | POA: Diagnosis not present

## 2017-03-06 DIAGNOSIS — I10 Essential (primary) hypertension: Secondary | ICD-10-CM | POA: Diagnosis not present

## 2017-03-06 DIAGNOSIS — Z6836 Body mass index (BMI) 36.0-36.9, adult: Secondary | ICD-10-CM | POA: Diagnosis not present

## 2017-03-06 DIAGNOSIS — J449 Chronic obstructive pulmonary disease, unspecified: Secondary | ICD-10-CM | POA: Diagnosis not present

## 2017-03-08 DIAGNOSIS — N764 Abscess of vulva: Secondary | ICD-10-CM | POA: Diagnosis not present

## 2017-03-15 DIAGNOSIS — N764 Abscess of vulva: Secondary | ICD-10-CM | POA: Diagnosis not present

## 2017-03-15 DIAGNOSIS — R102 Pelvic and perineal pain: Secondary | ICD-10-CM | POA: Diagnosis not present

## 2017-04-04 ENCOUNTER — Encounter: Payer: Self-pay | Admitting: Cardiology

## 2017-04-04 ENCOUNTER — Ambulatory Visit (INDEPENDENT_AMBULATORY_CARE_PROVIDER_SITE_OTHER): Payer: Medicare Other | Admitting: Cardiology

## 2017-04-04 ENCOUNTER — Other Ambulatory Visit: Payer: Self-pay

## 2017-04-04 VITALS — BP 130/84 | HR 94 | Ht 64.0 in | Wt 211.0 lb

## 2017-04-04 DIAGNOSIS — I25119 Atherosclerotic heart disease of native coronary artery with unspecified angina pectoris: Secondary | ICD-10-CM

## 2017-04-04 DIAGNOSIS — I10 Essential (primary) hypertension: Secondary | ICD-10-CM | POA: Diagnosis not present

## 2017-04-04 DIAGNOSIS — I209 Angina pectoris, unspecified: Secondary | ICD-10-CM

## 2017-04-04 DIAGNOSIS — E782 Mixed hyperlipidemia: Secondary | ICD-10-CM | POA: Diagnosis not present

## 2017-04-04 NOTE — Progress Notes (Signed)
Clinical Summary Casey Avila is a 71 y.o.female seen today for follow up of the following medical problems.   1. CAD/ICM - admitted with anterior STEMI 05/02/14, s/p DES to LAD. LVEF 40% by echo at that time. Repeat echo 07/2014 shows LVEF 55-60%.   - recent chest pain episodes.  - can be sharp like pain. Squeezing feeling throughout chest and in between shoulder blades, diaphoretic. 5/10 in severity. Felt dizzy. Not positional. Pain lasted about 30 minutes.  - different pain epigastric, burning like pain. Typically comes on at rest. +SOB, nauseous. - no SOB or DOE. Walks on treadmill x 10 minutes.  10/2016 nuclear stress: prior anterior/anteroseptal scar, mild peri-infarct ischemia. Mild apical ischemia. Low risk study - started imdur 22m daily since last visit, symptoms improved but not resolved.   - last visit we increased imdur to 356mdaily.  - symptoms improved since visit but not resolved. . Exertion is limited by back pain as well. No exertional symptoms.    2. HTN - home bp's around 110-120s/60-70s. - she is compliant with meds   3. Hyperlipidemia - 11/2015 TC 115 TG 210 HDL 45 LDL 28  - she remains compliant with statin  Past Medical History:  Diagnosis Date  . [place]neurysm ([facility]   brain aneurysms x 2 x several yrs  . Coronary artery disease   . DDD (degenerative disc disease), cervical   . Diabetes mellitus without complication ([place]  . GERD (gastroesophageal reflux disease)   . HTN (hypertension)   . Hyperlipidemia   . Ischemic cardiomyopathy 05/02/2014  . Osteoarthritis   . Osteoporosis   . ST elevation (STEMI) myocardial infarction involving left anterior descending coronary artery ([place]2014  . Ulcerative colitis ([facility]     Allergies  Allergen Reactions  . Clindamycin/Lincomycin Itching and Rash    Had to go into ER   . Bactrim [Sulfamethoxazole-Trimethoprim] Rash  . Morphine And Related     Makes me sick      Current Outpatient  Medications  Medication Sig Dispense Refill  . aspirin 81 MG chewable tablet Chew 1 tablet (81 mg total) by mouth daily.    . Marland Kitchentorvastatin (LIPITOR) 80 MG tablet Take 1 tablet (80 mg total) by mouth daily. 90 tablet 3  . baclofen (LIORESAL) 10 MG tablet Take 10 mg by mouth 2 (two) times daily.    . carvedilol (COREG) 6.25 MG tablet Take 1 tablet (6.25 mg total) by mouth 2 (two) times daily with a meal. 180 tablet 1  . Cholecalciferol (VITAMIN D) 2000 units CAPS Take by mouth daily.    . Cyanocobalamin 1000 MCG/ML KIT Inject as directed every 30 (thirty) days.    . enalapril (VASOTEC) 5 MG tablet Take 1 tablet (5 mg total) by mouth 2 (two) times daily. 180 tablet 3  . esomeprazole (NEXIUM) 40 MG capsule Take 40 mg by mouth daily at 12 noon.    . furosemide (LASIX) 40 MG tablet TAKE 1 TAB DAILY FOR 4 DAYS THEN AS NEEDED 90 tablet 3  . HYDROcodone-acetaminophen (NORCO) 7.5-325 MG tablet Take 1 tablet by mouth 2 (two) times daily.  0  . isosorbide mononitrate (IMDUR) 30 MG 24 hr tablet Take 1 tablet (30 mg total) by mouth daily. 90 tablet 1  . lidocaine (LIDODERM) 5 % Place 3 patches onto the skin daily. Remove & Discard patch within 12 hours or as directed by MD    . metFORMIN (GLUCOPHAGE) 500 MG tablet Take 500  mg by mouth daily with breakfast.    . nitroGLYCERIN (NITROSTAT) 0.4 MG SL tablet Place 1 tablet (0.4 mg total) under the tongue every 5 (five) minutes as needed for chest pain. 25 tablet 3  . potassium chloride SA (K-DUR,KLOR-CON) 20 MEQ tablet Take 1 tablet (20 mEq total) by mouth as directed. On the days that you take your as needed Lasix 90 tablet 0  . spironolactone (ALDACTONE) 25 MG tablet Take 0.5 tablets (12.5 mg total) by mouth daily. 45 tablet 3  . topiramate (TOPAMAX) 100 MG tablet Take 100 mg by mouth daily.    . traZODone (DESYREL) 50 MG tablet Take 50 mg by mouth at bedtime.    Marland Kitchen venlafaxine (EFFEXOR) 75 MG tablet Take 75 mg by mouth at bedtime.      No current  facility-administered medications for this visit.      Past Surgical History:  Procedure Laterality Date  . ABDOMINAL HYSTERECTOMY    . BLADDER SUSPENSION    . CARDIAC CATHETERIZATION    . NECK SURGERY     x 2  . PTCA       Allergies  Allergen Reactions  . Clindamycin/Lincomycin Itching and Rash    Had to go into ER   . Bactrim [Sulfamethoxazole-Trimethoprim] Rash  . Morphine And Related     Makes me sick       Family History  Problem Relation Age of Onset  . Anuerysm Mother   . Alcoholism Father   . Anuerysm Other   . Colon cancer Son      Social History Casey Avila reports that she quit smoking about 2 years ago. Her smoking use included cigarettes. She started smoking about 50 years ago. She has a 72.00 pack-year smoking history. she has never used smokeless tobacco. Casey Avila reports that she does not drink alcohol.   Review of Systems CONSTITUTIONAL: No weight loss, fever, chills, weakness or fatigue.  HEENT: Eyes: No visual loss, blurred vision, double vision or yellow sclerae.No hearing loss, sneezing, congestion, runny nose or sore throat.  SKIN: No rash or itching.  CARDIOVASCULAR: per hp RESPIRATORY: No shortness of breath, cough or sputum.  GASTROINTESTINAL: No anorexia, nausea, vomiting or diarrhea. No abdominal pain or blood.  GENITOURINARY: No burning on urination, no polyuria NEUROLOGICAL: No headache, dizziness, syncope, paralysis, ataxia, numbness or tingling in the extremities. No change in bowel or bladder control.  MUSCULOSKELETAL: No muscle, back pain, joint pain or stiffness.  LYMPHATICS: No enlarged nodes. No history of splenectomy.  PSYCHIATRIC: No history of depression or anxiety.  ENDOCRINOLOGIC: No reports of sweating, cold or heat intolerance. No polyuria or polydipsia.  Marland Kitchen   Physical Examination Vitals:   04/04/17 1109  BP: 130/84  Pulse: 94  SpO2: 98%   Vitals:   04/04/17 1109  Weight: 211 lb (95.7 kg)  Height: 5' 4"   (1.626 m)    Gen: resting comfortably, no acute distress HEENT: no scleral icterus, pupils equal round and reactive, no palptable cervical adenopathy,  CV: RRR, no m/r/g, no jvd Resp: Clear to auscultation bilaterally GI: abdomen is soft, non-tender, non-distended, normal bowel sounds, no hepatosplenomegaly MSK: extremities are warm, no edema.  Skin: warm, no rash Neuro:  no focal deficits Psych: appropriate affect   Diagnostic Studies 04/2014 Echo Study Conclusions  - Left ventricle: Can not rule out apical clot. Severe hypokinesis mid/apical inferior segments, mid/apical anterior segments, mid anteroseptal segment. The cavity size was normal. Wall thickness was increased in a pattern of mild  LVH. The estimated ejection fraction was 40%. Doppler parameters are consistent with abnormal left ventricular relaxation (grade 1 diastolic dysfunction). - Right ventricle: The cavity size was normal. Systolic function was normal.  04/2014 Cath  HEMODYNAMICS: Aortic pressure was 132/67; LV pressure was 136/19; LVEDP 34. There was no gradient between the left ventricle and aorta.   ANGIOGRAPHIC DATA: The left main coronary artery is widely patent.  The left anterior descending artery is a large vessel. The vessel is occluded in the midportion. After intervention, it was noted that there was disease just past the occlusion. The remainder of the mid to distal LAD was widely patent. There were several small diagonals which were patent.  The left circumflex artery is a large vessel with mild irregularities proximally. There is a large first obtuse marginal which is widely patent. The second obtuse marginal is large and widely patent.  The right coronary artery is a large dominant vessel. In the mid vessel, there is an eccentric 40% stenosis. The posterior descending artery is large and widely patent. The posterior lateral artery is large and widely patent.  LEFT  VENTRICULOGRAM: Left ventricular angiogram was not done due to elevated LVEDP. LVEDP was 34 mmHg.  PCI NARRATIVE: A CLS 3.0 guiding catheter was used to engage the left main. A pro-water wire was placed across the area disease in the LAD. A fetch thrombectomy catheter was used to perform aspiration thrombectomy. There was significant thrombus removal. A second pass was performed with even more thrombus removal. TIMI-3 flow was restored after the first pass. There was a residual mid vessel lesion which was about 12 mm in length, up to 80%. Just past this, there was another segment of moderate disease. The more proximal area was ballooned with a 2.5 x 12 balloon. The entire area was stented with a 2.75 x 28 Promus drug-eluting stent, postdilated to greater than 3 mm in diameter. There was some sluggish flow in the very apical LAD. Multiple doses of IC adenosine were given. TIMI-3 flow was present at the end of the procedure. The patient was pain-free.  IMPRESSIONS:  1. Normal left main coronary artery. 2. Occluded mid left anterior descending artery which was the culprit for today's presentation. This was successfully treated with a 2.75 x 28 Promus drug-eluting stent, postdilated to greater than 3 mm in diameter. 3. Mild disease in the left circumflex artery and its branches. 4. Mild to moderate disease in the right coronary artery. 5. Left ventricular systolic function was not assessed. LVEDP 34 mmHg.  RECOMMENDATION: We'll watch her in the ICU. Start low-dose beta blocker. Start high-dose statin. She needs aggressive secondary prevention. Of note, during the case, we checked about the safety of dual antiplatelet therapy with Dr. Ellene Route, as the patient has small brain aneurysms per her report. She was thought to be appropriate for prolonged dual antiplatelet therapy. She will need dual antiplatelet therapy for at least a year. Check echocardiogram to evaluate LV function.    07/2014  Echo Study Conclusions  - Left ventricle: The cavity size was normal. Wall thickness was normal. Systolic function was normal. The estimated ejection fraction was in the range of 55% to 60%. There is mild hypokinesis of the mid-apicalanteroseptal myocardium. Doppler parameters are consistent with abnormal left ventricular relaxation (grade 1 diastolic dysfunction). Doppler parameters are consistent with elevated ventricular end-diastolic filling pressure. - Aortic valve: Mildly calcified annulus. Trileaflet. - Left atrium: The atrium was mildly dilated. - Right atrium: Central venous pressure (est): 3 mm  Hg. - Atrial septum: There was increased thickness of the septum, consistent with lipomatous hypertrophy. - Pulmonary arteries: Systolic pressure could not be accurately estimated. - Pericardium, extracardiac: There was no pericardial effusion.  Impressions:  - Normal LV wall thickness with LVEF 55-60%, mild mid to apical anteroseptal hypokinesis, and grade 1 diastolic dysfunction. There has been improvement in LVEF compared to the prior study from December 2015. Mild left atrial enlargement. Unable to assess PASP.   11/2015 Nuclear Stress  There was no ST segment deviation noted during stress.  The study is normal. There are no perfusion defects consistent with prior infarction or current ischemia.  This is a low risk study.  The left ventricular ejection fraction is normal (55-65%).   10/2016 Nuclear stress  Blood pressure demonstrated a normal response to exercise.  There was no ST segment deviation noted during stress.  Findings consistent with prior anterior/anteroseptal myocardial infarction with mild peri-infarct ischemia. There is mild apical ischemia  This is a low risk study. Overall small amount of myocardium currently at jeopardy.  The left ventricular ejection fraction is normal (55-65%).    Assessment and Plan   1.  CAD/ICM -recent chest pain/anginal symptoms, stress test with mild  ischemia, overall low risk.  - symptoms improved since last visit, still with some atypical chest pain that sounds MSK related, perhaps related to her chronic neck and back troubles - continue to monitor at this time.   2. HTN - at goal based on home numbers, conitnue current meds   3. Hyperlipidemia - continue statin  F/u 4 months  Arnoldo Lenis, M.D.

## 2017-04-04 NOTE — Patient Instructions (Signed)
Your physician recommends that you schedule a follow-up appointment in: 4 MONTHS WITH DR BRANCH  Your physician recommends that you continue on your current medications as directed. Please refer to the Current Medication list given to you today.  Thank you for choosing [facility]!!    

## 2017-04-07 ENCOUNTER — Encounter: Payer: Self-pay | Admitting: Cardiology

## 2017-04-07 DIAGNOSIS — E1142 Type 2 diabetes mellitus with diabetic polyneuropathy: Secondary | ICD-10-CM | POA: Diagnosis not present

## 2017-04-07 DIAGNOSIS — I2510 Atherosclerotic heart disease of native coronary artery without angina pectoris: Secondary | ICD-10-CM | POA: Diagnosis not present

## 2017-04-07 DIAGNOSIS — Z299 Encounter for prophylactic measures, unspecified: Secondary | ICD-10-CM | POA: Diagnosis not present

## 2017-04-07 DIAGNOSIS — E1165 Type 2 diabetes mellitus with hyperglycemia: Secondary | ICD-10-CM | POA: Diagnosis not present

## 2017-04-07 DIAGNOSIS — M542 Cervicalgia: Secondary | ICD-10-CM | POA: Diagnosis not present

## 2017-04-07 DIAGNOSIS — Z6835 Body mass index (BMI) 35.0-35.9, adult: Secondary | ICD-10-CM | POA: Diagnosis not present

## 2017-04-11 DIAGNOSIS — M5440 Lumbago with sciatica, unspecified side: Secondary | ICD-10-CM | POA: Diagnosis not present

## 2017-04-11 DIAGNOSIS — M546 Pain in thoracic spine: Secondary | ICD-10-CM | POA: Diagnosis not present

## 2017-04-11 DIAGNOSIS — Z6836 Body mass index (BMI) 36.0-36.9, adult: Secondary | ICD-10-CM | POA: Diagnosis not present

## 2017-04-19 DIAGNOSIS — N764 Abscess of vulva: Secondary | ICD-10-CM | POA: Diagnosis not present

## 2017-04-20 DIAGNOSIS — Z6835 Body mass index (BMI) 35.0-35.9, adult: Secondary | ICD-10-CM | POA: Diagnosis not present

## 2017-04-20 DIAGNOSIS — Z0000 Encounter for general adult medical examination without abnormal findings: Secondary | ICD-10-CM | POA: Diagnosis not present

## 2017-04-20 DIAGNOSIS — Z1331 Encounter for screening for depression: Secondary | ICD-10-CM | POA: Diagnosis not present

## 2017-04-20 DIAGNOSIS — Z79899 Other long term (current) drug therapy: Secondary | ICD-10-CM | POA: Diagnosis not present

## 2017-04-20 DIAGNOSIS — E7800 Pure hypercholesterolemia, unspecified: Secondary | ICD-10-CM | POA: Diagnosis not present

## 2017-04-20 DIAGNOSIS — E559 Vitamin D deficiency, unspecified: Secondary | ICD-10-CM | POA: Diagnosis not present

## 2017-04-20 DIAGNOSIS — Z299 Encounter for prophylactic measures, unspecified: Secondary | ICD-10-CM | POA: Diagnosis not present

## 2017-04-20 DIAGNOSIS — Z1339 Encounter for screening examination for other mental health and behavioral disorders: Secondary | ICD-10-CM | POA: Diagnosis not present

## 2017-04-20 DIAGNOSIS — Z7189 Other specified counseling: Secondary | ICD-10-CM | POA: Diagnosis not present

## 2017-04-20 DIAGNOSIS — Z1211 Encounter for screening for malignant neoplasm of colon: Secondary | ICD-10-CM | POA: Diagnosis not present

## 2017-04-20 DIAGNOSIS — R5383 Other fatigue: Secondary | ICD-10-CM | POA: Diagnosis not present

## 2017-04-20 DIAGNOSIS — N907 Vulvar cyst: Secondary | ICD-10-CM | POA: Diagnosis not present

## 2017-04-21 DIAGNOSIS — Z79899 Other long term (current) drug therapy: Secondary | ICD-10-CM | POA: Diagnosis not present

## 2017-04-21 DIAGNOSIS — R5383 Other fatigue: Secondary | ICD-10-CM | POA: Diagnosis not present

## 2017-04-21 DIAGNOSIS — E559 Vitamin D deficiency, unspecified: Secondary | ICD-10-CM | POA: Diagnosis not present

## 2017-04-21 DIAGNOSIS — E7800 Pure hypercholesterolemia, unspecified: Secondary | ICD-10-CM | POA: Diagnosis not present

## 2017-05-04 DIAGNOSIS — M5442 Lumbago with sciatica, left side: Secondary | ICD-10-CM | POA: Diagnosis not present

## 2017-05-04 DIAGNOSIS — M5441 Lumbago with sciatica, right side: Secondary | ICD-10-CM | POA: Diagnosis not present

## 2017-05-09 ENCOUNTER — Other Ambulatory Visit: Payer: Self-pay | Admitting: *Deleted

## 2017-05-09 MED ORDER — ISOSORBIDE MONONITRATE ER 30 MG PO TB24: 30.0000 mg | ORAL_TABLET | Freq: Every day | ORAL | 4 refills | Status: DC

## 2017-05-29 ENCOUNTER — Telehealth: Payer: Self-pay | Admitting: Cardiology

## 2017-05-29 MED ORDER — ATORVASTATIN CALCIUM 80 MG PO TABS: 80.0000 mg | ORAL_TABLET | Freq: Every day | ORAL | 1 refills | Status: DC

## 2017-05-29 NOTE — Telephone Encounter (Signed)
°*  STAT* If patient is at the pharmacy, call can be transferred to refill team.   1. Which medications need to be refilled? atorvastatin (LIPITOR) 80 MG tablet   2. Which pharmacy/location (including street and city if local pharmacy) is medication to be sent to?  [facility] - [place], [address]  3. Do they need a 30 day or 90 day supply? [place]

## 2017-05-29 NOTE — Telephone Encounter (Signed)
Medication refilled

## 2017-05-31 DIAGNOSIS — Z6835 Body mass index (BMI) 35.0-35.9, adult: Secondary | ICD-10-CM | POA: Diagnosis not present

## 2017-05-31 DIAGNOSIS — M546 Pain in thoracic spine: Secondary | ICD-10-CM | POA: Diagnosis not present

## 2017-05-31 DIAGNOSIS — M5440 Lumbago with sciatica, unspecified side: Secondary | ICD-10-CM | POA: Diagnosis not present

## 2017-07-04 ENCOUNTER — Telehealth: Payer: Self-pay | Admitting: Cardiology

## 2017-07-04 MED ORDER — CARVEDILOL 6.25 MG PO TABS: 6.2500 mg | ORAL_TABLET | Freq: Two times a day (BID) | ORAL | 0 refills | Status: DC

## 2017-07-04 NOTE — Telephone Encounter (Signed)
Casey Avila would like to speak with someone in regards to her medications.  Please call [phone].

## 2017-07-04 NOTE — Telephone Encounter (Signed)
Patient needed refill on carvedilol. Refill sent to [facility] per patients request

## 2017-07-19 ENCOUNTER — Encounter: Payer: Self-pay | Admitting: Cardiology

## 2017-07-19 DIAGNOSIS — E7800 Pure hypercholesterolemia, unspecified: Secondary | ICD-10-CM | POA: Diagnosis not present

## 2017-07-19 DIAGNOSIS — E1165 Type 2 diabetes mellitus with hyperglycemia: Secondary | ICD-10-CM | POA: Diagnosis not present

## 2017-07-19 DIAGNOSIS — M25562 Pain in left knee: Secondary | ICD-10-CM | POA: Diagnosis not present

## 2017-07-19 DIAGNOSIS — J449 Chronic obstructive pulmonary disease, unspecified: Secondary | ICD-10-CM | POA: Diagnosis not present

## 2017-07-19 DIAGNOSIS — Z299 Encounter for prophylactic measures, unspecified: Secondary | ICD-10-CM | POA: Diagnosis not present

## 2017-07-19 DIAGNOSIS — E1142 Type 2 diabetes mellitus with diabetic polyneuropathy: Secondary | ICD-10-CM | POA: Diagnosis not present

## 2017-07-19 DIAGNOSIS — Z6835 Body mass index (BMI) 35.0-35.9, adult: Secondary | ICD-10-CM | POA: Diagnosis not present

## 2017-07-19 DIAGNOSIS — R350 Frequency of micturition: Secondary | ICD-10-CM | POA: Diagnosis not present

## 2017-07-19 DIAGNOSIS — R109 Unspecified abdominal pain: Secondary | ICD-10-CM | POA: Diagnosis not present

## 2017-07-19 DIAGNOSIS — K5190 Ulcerative colitis, unspecified, without complications: Secondary | ICD-10-CM | POA: Diagnosis not present

## 2017-07-19 DIAGNOSIS — B999 Unspecified infectious disease: Secondary | ICD-10-CM | POA: Diagnosis not present

## 2017-07-19 DIAGNOSIS — I10 Essential (primary) hypertension: Secondary | ICD-10-CM | POA: Diagnosis not present

## 2017-08-02 ENCOUNTER — Ambulatory Visit (INDEPENDENT_AMBULATORY_CARE_PROVIDER_SITE_OTHER): Payer: Medicare Other | Admitting: Cardiology

## 2017-08-02 ENCOUNTER — Encounter: Payer: Self-pay | Admitting: Cardiology

## 2017-08-02 VITALS — BP 107/69 | HR 86 | Ht 64.0 in | Wt 207.8 lb

## 2017-08-02 DIAGNOSIS — I25119 Atherosclerotic heart disease of native coronary artery with unspecified angina pectoris: Secondary | ICD-10-CM

## 2017-08-02 DIAGNOSIS — E782 Mixed hyperlipidemia: Secondary | ICD-10-CM

## 2017-08-02 DIAGNOSIS — I10 Essential (primary) hypertension: Secondary | ICD-10-CM

## 2017-08-02 MED ORDER — ATORVASTATIN CALCIUM 80 MG PO TABS: 80.0000 mg | ORAL_TABLET | Freq: Every day | ORAL | 1 refills | Status: DC

## 2017-08-02 MED ORDER — FUROSEMIDE 40 MG PO TABS: ORAL_TABLET | ORAL | 1 refills | Status: AC

## 2017-08-02 MED ORDER — ISOSORBIDE MONONITRATE ER 30 MG PO TB24: 30.0000 mg | ORAL_TABLET | Freq: Every day | ORAL | 1 refills | Status: DC

## 2017-08-02 MED ORDER — POTASSIUM CHLORIDE CRYS ER 20 MEQ PO TBCR: 20.0000 meq | EXTENDED_RELEASE_TABLET | ORAL | 1 refills | Status: AC

## 2017-08-02 MED ORDER — NITROGLYCERIN 0.4 MG SL SUBL: 0.4000 mg | SUBLINGUAL_TABLET | SUBLINGUAL | 3 refills | Status: AC | PRN

## 2017-08-02 MED ORDER — SPIRONOLACTONE 25 MG PO TABS: 12.5000 mg | ORAL_TABLET | Freq: Every day | ORAL | 1 refills | Status: DC

## 2017-08-02 MED ORDER — CARVEDILOL 6.25 MG PO TABS: 6.2500 mg | ORAL_TABLET | Freq: Two times a day (BID) | ORAL | 1 refills | Status: DC

## 2017-08-02 NOTE — Progress Notes (Signed)
Clinical Summary Casey Avila is a 72 y.o.female seen today for follow up of the following medical problems.   1. CAD/ICM - admitted with anterior STEMI 05/02/14, s/p DES to LAD. LVEF 40% by echo at that time. Repeat echo 07/2014 shows LVEF 55-60%.   - recent chest pain episodes.  - can be sharp like pain. Squeezing feeling throughout chest and in between shoulder blades, diaphoretic. 5/10 in severity. Felt dizzy. Not positional. Pain lasted about 30 minutes.  - different pain epigastric, burning like pain. Typically comes on at rest. +SOB, nauseous. - no SOB or DOE. Walks on treadmill x 10 minutes.  10/2016 nuclear stress: prior anterior/anteroseptal scar, mild peri-infarct ischemia. Mild apical ischemia. Low risk study - symptoms improved on imdur  - since last visit symptoms are improved. No exertional symptoms - no SOB/DOE  2. HTN - home bp's 120s/60-70s - she is compliant with meds  3. Hyperlipidemia - 11/2015 TC 115 TG 210 HDL 45 LDL 28  - compliant with statin   Past Medical History:  Diagnosis Date  . Aneurysm ([place])    brain aneurysms x 2 x several yrs  . Coronary artery disease   . DDD (degenerative disc disease), cervical   . Diabetes mellitus without complication ([place])   . GERD (gastroesophageal reflux disease)   . HTN (hypertension)   . Hyperlipidemia   . Ischemic cardiomyopathy 05/02/2014  . Osteoarthritis   . Osteoporosis   . ST elevation (STEMI) myocardial infarction involving left anterior descending coronary artery ([place]) 05/02/2014  . Ulcerative colitis ([place])      Allergies  Allergen Reactions  . Clindamycin/Lincomycin Itching and Rash    Had to go into ER   . Bactrim [Sulfamethoxazole-Trimethoprim] Rash  . Morphine And Related     Makes me sick      Current Outpatient Medications  Medication Sig Dispense Refill  . aspirin 81 MG chewable tablet Chew 1 tablet (81 mg total) by mouth daily.    Marland Kitchen atorvastatin (LIPITOR) 80 MG tablet Take  1 tablet (80 mg total) by mouth daily. 90 tablet 1  . baclofen (LIORESAL) 10 MG tablet Take 10 mg by mouth 2 (two) times daily.    . carvedilol (COREG) 6.25 MG tablet Take 1 tablet (6.25 mg total) by mouth 2 (two) times daily with a meal. 180 tablet 0  . Cholecalciferol (VITAMIN D) 2000 units CAPS Take by mouth daily.    . Cyanocobalamin 1000 MCG/ML KIT Inject as directed every 30 (thirty) days.    . enalapril (VASOTEC) 5 MG tablet Take 1 tablet (5 mg total) by mouth 2 (two) times daily. 180 tablet 3  . esomeprazole (NEXIUM) 40 MG capsule Take 40 mg by mouth daily at 12 noon.    . furosemide (LASIX) 40 MG tablet TAKE 1 TAB DAILY FOR 4 DAYS THEN AS NEEDED 90 tablet 3  . HYDROcodone-acetaminophen (NORCO) 7.5-325 MG tablet Take 1 tablet by mouth 2 (two) times daily.  0  . isosorbide mononitrate (IMDUR) 30 MG 24 hr tablet Take 1 tablet (30 mg total) by mouth daily. 90 tablet 4  . lidocaine (LIDODERM) 5 % Place 3 patches onto the skin daily. Remove & Discard patch within 12 hours or as directed by MD    . metFORMIN (GLUCOPHAGE) 500 MG tablet Take 500 mg by mouth daily with breakfast.    . nitroGLYCERIN (NITROSTAT) 0.4 MG SL tablet Place 1 tablet (0.4 mg total) under the tongue every 5 (five) minutes as needed  for chest pain. 25 tablet 3  . potassium chloride SA (K-DUR,KLOR-CON) 20 MEQ tablet Take 1 tablet (20 mEq total) by mouth as directed. On the days that you take your as needed Lasix 90 tablet 0  . spironolactone (ALDACTONE) 25 MG tablet Take 0.5 tablets (12.5 mg total) by mouth daily. 45 tablet 3  . topiramate (TOPAMAX) 100 MG tablet Take 100 mg by mouth daily.    . traZODone (DESYREL) 50 MG tablet Take 50 mg by mouth at bedtime.    Marland Kitchen venlafaxine (EFFEXOR) 75 MG tablet Take 75 mg by mouth at bedtime.      No current facility-administered medications for this visit.      Past Surgical History:  Procedure Laterality Date  . ABDOMINAL HYSTERECTOMY    . BLADDER SUSPENSION    . CARDIAC  CATHETERIZATION    . LEFT HEART CATHETERIZATION WITH CORONARY ANGIOGRAM N/A 05/02/2014   Procedure: LEFT HEART CATHETERIZATION WITH CORONARY ANGIOGRAM;  Surgeon: Jettie Booze, MD;  Location: [facility] C[facility] LAB;  Service: Cardiovascular;  Laterality: N/A;  . NECK SURGERY     x 2  . PTCA       Allergies  Allergen Reactions  . Clindamycin/Lincomycin Itching and Rash    Had to go into ER   . Bactrim [Sulfamethoxazole-Trimethoprim] Rash  . Morphine And Related     Makes me sick       Family History  Problem Relation Age of Onset  . Anuerysm Mother   . Alcoholism Father   . Anuerysm Other   . Colon cancer Son      Social History Casey Avila reports that she quit smoking about 3 years ago. Her smoking use included cigarettes. She started smoking about 51 years ago. She has a 72.00 pack-year smoking history. she has never used smokeless tobacco. Casey Avila reports that she does not drink alcohol.   Review of Systems CONSTITUTIONAL: No weight loss, fever, chills, weakness or fatigue.  HEENT: Eyes: No visual loss, blurred vision, double vision or yellow sclerae.No hearing loss, sneezing, congestion, runny nose or sore throat.  SKIN: No rash or itching.  CARDIOVASCULAR: per hpi RESPIRATORY: No shortness of breath, cough or sputum.  GASTROINTESTINAL: No anorexia, nausea, vomiting or diarrhea. No abdominal pain or blood.  GENITOURINARY: No burning on urination, no polyuria NEUROLOGICAL: No headache, dizziness, syncope, paralysis, ataxia, numbness or tingling in the extremities. No change in bowel or bladder control.  MUSCULOSKELETAL: No muscle, back pain, joint pain or stiffness.  LYMPHATICS: No enlarged nodes. No history of splenectomy.  PSYCHIATRIC: No history of depression or anxiety.  ENDOCRINOLOGIC: No reports of sweating, cold or heat intolerance. No polyuria or polydipsia.  Marland Kitchen   Physical Examination Vitals:   08/02/17 1126  BP: 107/69  Pulse: 86  SpO2: 97%   Vitals:     08/02/17 1126  Weight: 207 lb 12.8 oz (94.3 kg)  Height: _0  (1.626 m)    Gen: resting comfortably, no acute distress HEENT: no scleral icterus, pupils equal round and reactive, no palptable cervical adenopathy,  CV: RRR, no m/r/g, no jvd Resp: Clear to auscultation bilaterally GI: abdomen is soft, non-tender, non-distended, normal bowel sounds, no hepatosplenomegaly MSK: extremities are warm, no edema.  Skin: warm, no rash Neuro:  no focal deficits Psych: appropriate affect   Diagnostic Studies 04/2014 Echo Study Conclusions  - Left ventricle: Can not rule out apical clot. Severe hypokinesis mid/apical inferior segments, mid/apical anterior segments, mid anteroseptal segment. The cavity size was normal. Wall  thickness was increased in a pattern of mild LVH. The estimated ejection fraction was 40%. Doppler parameters are consistent with abnormal left ventricular relaxation (grade 1 diastolic dysfunction). - Right ventricle: The cavity size was normal. Systolic function was normal.  04/2014 Cath  HEMODYNAMICS: Aortic pressure was 132/67; LV pressure was 136/19; LVEDP 34. There was no gradient between the left ventricle and aorta.   ANGIOGRAPHIC DATA: The left main coronary artery is widely patent.  The left anterior descending artery is a large vessel. The vessel is occluded in the midportion. After intervention, it was noted that there was disease just past the occlusion. The remainder of the mid to distal LAD was widely patent. There were several small diagonals which were patent.  The left circumflex artery is a large vessel with mild irregularities proximally. There is a large first obtuse marginal which is widely patent. The second obtuse marginal is large and widely patent.  The right coronary artery is a large dominant vessel. In the mid vessel, there is an eccentric 40% stenosis. The posterior descending artery is large and widely patent. The  posterior lateral artery is large and widely patent.  LEFT VENTRICULOGRAM: Left ventricular angiogram was not done due to elevated LVEDP. LVEDP was 34 mmHg.  PCI NARRATIVE: A CLS 3.0 guiding catheter was used to engage the left main. A pro-water wire was placed across the area disease in the LAD. A fetch thrombectomy catheter was used to perform aspiration thrombectomy. There was significant thrombus removal. A second pass was performed with even more thrombus removal. TIMI-3 flow was restored after the first pass. There was a residual mid vessel lesion which was about 12 mm in length, up to 80%. Just past this, there was another segment of moderate disease. The more proximal area was ballooned with a 2.5 x 12 balloon. The entire area was stented with a 2.75 x 28 Promus drug-eluting stent, postdilated to greater than 3 mm in diameter. There was some sluggish flow in the very apical LAD. Multiple doses of IC adenosine were given. TIMI-3 flow was present at the end of the procedure. The patient was pain-free.  IMPRESSIONS:  1. Normal left main coronary artery. 2. Occluded mid left anterior descending artery which was the culprit for today's presentation. This was successfully treated with a 2.75 x 28 Promus drug-eluting stent, postdilated to greater than 3 mm in diameter. 3. Mild disease in the left circumflex artery and its branches. 4. Mild to moderate disease in the right coronary artery. 5. Left ventricular systolic function was not assessed. LVEDP 34 mmHg.  RECOMMENDATION: We'll watch her in the ICU. Start low-dose beta blocker. Start high-dose statin. She needs aggressive secondary prevention. Of note, during the case, we checked about the safety of dual antiplatelet therapy with Dr. Ellene Route, as the patient has small brain aneurysms per her report. She was thought to be appropriate for prolonged dual antiplatelet therapy. She will need dual antiplatelet therapy for at least a year. Check  echocardiogram to evaluate LV function.    07/2014 Echo Study Conclusions  - Left ventricle: The cavity size was normal. Wall thickness was normal. Systolic function was normal. The estimated ejection fraction was in the range of 55% to 60%. There is mild hypokinesis of the mid-apicalanteroseptal myocardium. Doppler parameters are consistent with abnormal left ventricular relaxation (grade 1 diastolic dysfunction). Doppler parameters are consistent with elevated ventricular end-diastolic filling pressure. - Aortic valve: Mildly calcified annulus. Trileaflet. - Left atrium: The atrium was mildly dilated. -  Right atrium: Central venous pressure (est): 3 mm Hg. - Atrial septum: There was increased thickness of the septum, consistent with lipomatous hypertrophy. - Pulmonary arteries: Systolic pressure could not be accurately estimated. - Pericardium, extracardiac: There was no pericardial effusion.  Impressions:  - Normal LV wall thickness with LVEF 55-60%, mild mid to apical anteroseptal hypokinesis, and grade 1 diastolic dysfunction. There has been improvement in LVEF compared to the prior study from December 2015. Mild left atrial enlargement. Unable to assess PASP.   11/2015 Nuclear Stress  There was no ST segment deviation noted during stress.  The study is normal. There are no perfusion defects consistent with prior infarction or current ischemia.  This is a low risk study.  The left ventricular ejection fraction is normal (55-65%).   10/2016 Nuclear stress  Blood pressure demonstrated a normal response to exercise.  There was no ST segment deviation noted during stress.  Findings consistent with prior anterior/anteroseptal myocardial infarction with mild peri-infarct ischemia. There is mild apical ischemia  This is a low risk study. Overall small amount of myocardium currently at jeopardy.  The left ventricular ejection fraction  is normal (55-65%).    Assessment and Plan  1. CAD/ICM - no significant symptoms, recent stress test overall low risk. Prior symptoms improved with imdur - continue current meds  2. HTN - at goal, continue current meds   3. Hyperlipidemia - continue statin, request labs from pcp      Arnoldo Lenis, M.D.

## 2017-08-02 NOTE — Patient Instructions (Signed)

## 2017-08-03 ENCOUNTER — Encounter: Payer: Self-pay | Admitting: *Deleted

## 2017-08-03 DIAGNOSIS — M5440 Lumbago with sciatica, unspecified side: Secondary | ICD-10-CM | POA: Diagnosis not present

## 2017-08-03 DIAGNOSIS — M545 Low back pain: Secondary | ICD-10-CM | POA: Diagnosis not present

## 2017-08-06 ENCOUNTER — Encounter: Payer: Self-pay | Admitting: Cardiology

## 2017-08-08 ENCOUNTER — Ambulatory Visit: Payer: Medicare Other | Admitting: Cardiology

## 2017-08-16 ENCOUNTER — Telehealth: Payer: Self-pay | Admitting: Cardiology

## 2017-08-16 NOTE — Telephone Encounter (Signed)
BP has been high until recently they have been running low and she is concerned about this

## 2017-08-16 NOTE — Telephone Encounter (Signed)
Any reason she would be dehdyrated? How has she been taking her fluid pill lately? Lets lower her enalapril to 2.5mg  bid and have her update [place] Friday   J BrancH MD

## 2017-08-16 NOTE — Telephone Encounter (Signed)
Noticed BP readings running lower over the last week.  Had episode of low BP around 8pm when she got up to go to bathroom - BP was 60/40.  Felt like she was going to be sick, but did not.  Sit down & sipped on some water and slowly got to feeling better.  Did not feel like she needed to go to ED.  The following morning - BP was 100/60.  Stated she did have pain in between her shoulder blades at that time, but does have back pain already.  Dizziness noted with low BP & sweats.  Stated that she is diabetic & has not checked her sugar during these episodes.  No weight gain, states she is losing a little at a time lately.  I have encouraged patient to check blood sugar after these episodes to rule out sugar dropping since she is diabetic.  Message fwd to provider for any further suggestions.

## 2017-08-17 MED ORDER — ENALAPRIL MALEATE 5 MG PO TABS: 2.5000 mg | ORAL_TABLET | Freq: Two times a day (BID) | ORAL | Status: DC

## 2017-08-17 NOTE — Telephone Encounter (Signed)
Patient & husband notified.  They will call back on Monday with update.

## 2017-08-28 IMAGING — NM NM MYOCAR MULTI W/SPECT W/WALL MOTION & EF
2 series · 12 of 12 positions shown · non-contrast
Comparison: none

[Series 1: rest · 8.28mm/px · 6 of 64 frames shown]
[frame 6/64]
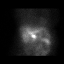
[frame 16/64]
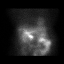
[frame 27/64]
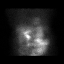
[frame 38/64]
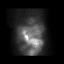
[frame 48/64]
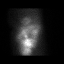
[frame 59/64]
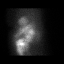

[Series 2: stress gated · 8.28mm/px · 6 of 64 frames shown]
[frame 6/64]
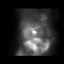
[frame 16/64]
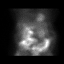
[frame 27/64]
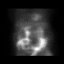
[frame 38/64]
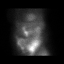
[frame 48/64]
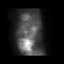
[frame 59/64]
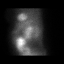

[12 of 12 positions shown; findings below may reference images not displayed]

Canned report from images found in remote index.

Refer to host system for actual result text.

## 2017-09-12 DIAGNOSIS — M5440 Lumbago with sciatica, unspecified side: Secondary | ICD-10-CM | POA: Diagnosis not present

## 2017-09-12 DIAGNOSIS — Z6835 Body mass index (BMI) 35.0-35.9, adult: Secondary | ICD-10-CM | POA: Diagnosis not present

## 2017-10-25 DIAGNOSIS — J449 Chronic obstructive pulmonary disease, unspecified: Secondary | ICD-10-CM | POA: Diagnosis not present

## 2017-10-25 DIAGNOSIS — K5190 Ulcerative colitis, unspecified, without complications: Secondary | ICD-10-CM | POA: Diagnosis not present

## 2017-10-25 DIAGNOSIS — E1165 Type 2 diabetes mellitus with hyperglycemia: Secondary | ICD-10-CM | POA: Diagnosis not present

## 2017-10-25 DIAGNOSIS — Z299 Encounter for prophylactic measures, unspecified: Secondary | ICD-10-CM | POA: Diagnosis not present

## 2017-10-25 DIAGNOSIS — I10 Essential (primary) hypertension: Secondary | ICD-10-CM | POA: Diagnosis not present

## 2017-10-25 DIAGNOSIS — Z6834 Body mass index (BMI) 34.0-34.9, adult: Secondary | ICD-10-CM | POA: Diagnosis not present

## 2017-10-25 DIAGNOSIS — E1142 Type 2 diabetes mellitus with diabetic polyneuropathy: Secondary | ICD-10-CM | POA: Diagnosis not present

## 2017-11-07 DIAGNOSIS — M5432 Sciatica, left side: Secondary | ICD-10-CM | POA: Diagnosis not present

## 2017-11-07 DIAGNOSIS — M5431 Sciatica, right side: Secondary | ICD-10-CM | POA: Diagnosis not present

## 2017-11-10 ENCOUNTER — Encounter: Payer: Self-pay | Admitting: Cardiology

## 2017-11-10 ENCOUNTER — Telehealth: Payer: Self-pay | Admitting: Cardiology

## 2017-11-10 ENCOUNTER — Other Ambulatory Visit: Payer: Self-pay | Admitting: Cardiology

## 2017-11-10 ENCOUNTER — Encounter: Payer: Self-pay | Admitting: *Deleted

## 2017-11-10 ENCOUNTER — Ambulatory Visit (INDEPENDENT_AMBULATORY_CARE_PROVIDER_SITE_OTHER): Payer: Medicare Other | Admitting: Cardiology

## 2017-11-10 VITALS — BP 112/70 | HR 82 | Ht 64.0 in | Wt 204.4 lb

## 2017-11-10 DIAGNOSIS — I255 Ischemic cardiomyopathy: Secondary | ICD-10-CM

## 2017-11-10 DIAGNOSIS — R079 Chest pain, unspecified: Secondary | ICD-10-CM

## 2017-11-10 DIAGNOSIS — I25119 Atherosclerotic heart disease of native coronary artery with unspecified angina pectoris: Secondary | ICD-10-CM | POA: Diagnosis not present

## 2017-11-10 DIAGNOSIS — R0789 Other chest pain: Secondary | ICD-10-CM

## 2017-11-10 MED ORDER — ISOSORBIDE MONONITRATE ER 30 MG PO TB24: 45.0000 mg | ORAL_TABLET | Freq: Every day | ORAL | 3 refills | Status: DC

## 2017-11-10 NOTE — Telephone Encounter (Signed)
Pre-cert Verification for the following procedure   Left Heart cath - [facility] - 11/14/17 1:30

## 2017-11-10 NOTE — H&P (View-Only) (Signed)
Clinical Summary Ms. Dingley is a 72 y.o.female seen today for follow up of the following medical problems.This is a focused visit on history of CAD and recent chest pain.   1. CAD/ICM - admitted with anterior STEMI 05/02/14, s/p DES to LAD. LVEF 40% by echo at that time. Repeat echo 07/2014 shows LVEF 55-60%.   10/2016 nuclear stress: prior anterior/anteroseptal scar, mild peri-infarct ischemia. Mild apical ischemia. Low risk study - chest pain symptoms improved on imdur initially.   - since last visit worsening chest pain.  - pain ongoing x few weeks. Bilateral shoudler into left chest. Sharp pain, 8/10 in severity. Felt nauseous. No change in breathing. Had some dizziness. Not positional. Lasted about 30 minutes. Layed and down relaxed. No current pain. - she reports history of 3 ruptured disc in back, different chronic back pain - has had some increased diaphoresis with activities which is new for her.  - she reports her prior MI was mainly pain in bilateral shoulders. Pain can radiate - compliant with meds.       Past Medical History:  Diagnosis Date  . Aneurysm ([place])    brain aneurysms x 2 x several yrs  . Coronary artery disease   . DDD (degenerative disc disease), cervical   . Diabetes mellitus without complication ([place])   . GERD (gastroesophageal reflux disease)   . HTN (hypertension)   . Hyperlipidemia   . Ischemic cardiomyopathy 05/02/2014  . Osteoarthritis   . Osteoporosis   . ST elevation (STEMI) myocardial infarction involving left anterior descending coronary artery ([place]) 05/02/2014  . Ulcerative colitis ([place])      Allergies  Allergen Reactions  . Clindamycin/Lincomycin Itching and Rash    Had to go into ER   . Bactrim [Sulfamethoxazole-Trimethoprim] Rash  . Morphine And Related     Makes me sick      Current Outpatient Medications  Medication Sig Dispense Refill  . aspirin 81 MG chewable tablet Chew 1 tablet (81 mg total) by mouth daily.      Marland Kitchen atorvastatin (LIPITOR) 80 MG tablet Take 1 tablet (80 mg total) by mouth daily. 90 tablet 1  . baclofen (LIORESAL) 10 MG tablet Take 10 mg by mouth 2 (two) times daily.    . carvedilol (COREG) 6.25 MG tablet Take 1 tablet (6.25 mg total) by mouth 2 (two) times daily with a meal. 180 tablet 1  . Cholecalciferol (VITAMIN D) 2000 units CAPS Take by mouth daily.    . Cyanocobalamin 1000 MCG/ML KIT Inject as directed every 30 (thirty) days.    . enalapril (VASOTEC) 5 MG tablet Take 0.5 tablets (2.5 mg total) by mouth 2 (two) times daily.    Marland Kitchen esomeprazole (NEXIUM) 40 MG capsule Take 40 mg by mouth daily at 12 noon.    . furosemide (LASIX) 40 MG tablet TAKE 1 TAB DAILY AS NEEDED 90 tablet 1  . HYDROcodone-acetaminophen (NORCO) 7.5-325 MG tablet Take 1 tablet by mouth 2 (two) times daily.  0  . isosorbide mononitrate (IMDUR) 30 MG 24 hr tablet Take 1 tablet (30 mg total) by mouth daily. 90 tablet 1  . lidocaine (LIDODERM) 5 % Place 3 patches onto the skin daily. Remove & Discard patch within 12 hours or as directed by MD    . nitroGLYCERIN (NITROSTAT) 0.4 MG SL tablet Place 1 tablet (0.4 mg total) under the tongue every 5 (five) minutes as needed for chest pain. 25 tablet 3  . potassium chloride SA (K-DUR,KLOR-CON) 20  MEQ tablet Take 1 tablet (20 mEq total) by mouth as directed. On the days that you take your as needed Lasix 90 tablet 1  . sitaGLIPtin (JANUVIA) 100 MG tablet Take 100 mg by mouth daily.    Marland Kitchen spironolactone (ALDACTONE) 25 MG tablet Take 0.5 tablets (12.5 mg total) by mouth daily. 45 tablet 1  . topiramate (TOPAMAX) 100 MG tablet Take 100 mg by mouth daily.    . traZODone (DESYREL) 50 MG tablet Take 50 mg by mouth at bedtime.    Marland Kitchen venlafaxine (EFFEXOR) 75 MG tablet Take 75 mg by mouth at bedtime.      No current facility-administered medications for this visit.      Past Surgical History:  Procedure Laterality Date  . ABDOMINAL HYSTERECTOMY    . BLADDER SUSPENSION    . CARDIAC  CATHETERIZATION    . LEFT HEART CATHETERIZATION WITH CORONARY ANGIOGRAM N/A 05/02/2014   Procedure: LEFT HEART CATHETERIZATION WITH CORONARY ANGIOGRAM;  Surgeon: Jettie Booze, MD;  Location: [facility] CATH LAB;  Service: Cardiovascular;  Laterality: N/A;  . NECK SURGERY     x 2  . PTCA       Allergies  Allergen Reactions  . Clindamycin/Lincomycin Itching and Rash    Had to go into ER   . Bactrim [Sulfamethoxazole-Trimethoprim] Rash  . Morphine And Related     Makes me sick       Family History  Problem Relation Age of Onset  . Anuerysm Mother   . Alcoholism Father   . Anuerysm Other   . Colon cancer Son      Social History Ms. Hartin reports that she quit smoking about 3 years ago. Her smoking use included cigarettes. She started smoking about 51 years ago. She has a 72.00 pack-year smoking history. She has never used smokeless tobacco. Ms. Buendia reports that she does not drink alcohol.   Review of Systems CONSTITUTIONAL: No weight loss, fever, chills, weakness or fatigue.  HEENT: Eyes: No visual loss, blurred vision, double vision or yellow sclerae.No hearing loss, sneezing, congestion, runny nose or sore throat.  SKIN: No rash or itching.  CARDIOVASCULAR: per hpi RESPIRATORY: No shortness of breath, cough or sputum.  GASTROINTESTINAL: No anorexia, nausea, vomiting or diarrhea. No abdominal pain or blood.  GENITOURINARY: No burning on urination, no polyuria NEUROLOGICAL: No headache, dizziness, syncope, paralysis, ataxia, numbness or tingling in the extremities. No change in bowel or bladder control.  MUSCULOSKELETAL: No muscle, back pain, joint pain or stiffness.  LYMPHATICS: No enlarged nodes. No history of splenectomy.  PSYCHIATRIC: No history of depression or anxiety.  ENDOCRINOLOGIC: No reports of sweating, cold or heat intolerance. No polyuria or polydipsia.  Marland Kitchen   Physical Examination Vitals:   11/10/17 1421  BP: 112/70  Pulse: 82  SpO2: 97%   Vitals:     11/10/17 1421  Weight: 204 lb 6.4 oz (92.7 kg)  Height: 5' 4"  (1.626 m)    Gen: resting comfortably, no acute distress HEENT: no scleral icterus, pupils equal round and reactive, no palptable cervical adenopathy,  CV: RRR, no m/r/g, no jvd Resp: Clear to auscultation bilaterally GI: abdomen is soft, non-tender, non-distended, normal bowel sounds, no hepatosplenomegaly MSK: extremities are warm, no edema.  Skin: warm, no rash Neuro:  no focal deficits Psych: appropriate affect   Diagnostic Studies 04/2014 Echo Study Conclusions  - Left ventricle: Can not rule out apical clot. Severe hypokinesis mid/apical inferior segments, mid/apical anterior segments, mid anteroseptal segment. The cavity size was  normal. Wall thickness was increased in a pattern of mild LVH. The estimated ejection fraction was 40%. Doppler parameters are consistent with abnormal left ventricular relaxation (grade 1 diastolic dysfunction). - Right ventricle: The cavity size was normal. Systolic function was normal.  04/2014 Cath  HEMODYNAMICS: Aortic pressure was 132/67; LV pressure was 136/19; LVEDP 34. There was no gradient between the left ventricle and aorta.   ANGIOGRAPHIC DATA: The left main coronary artery is widely patent.  The left anterior descending artery is a large vessel. The vessel is occluded in the midportion. After intervention, it was noted that there was disease just past the occlusion. The remainder of the mid to distal LAD was widely patent. There were several small diagonals which were patent.  The left circumflex artery is a large vessel with mild irregularities proximally. There is a large first obtuse marginal which is widely patent. The second obtuse marginal is large and widely patent.  The right coronary artery is a large dominant vessel. In the mid vessel, there is an eccentric 40% stenosis. The posterior descending artery is large and widely patent. The  posterior lateral artery is large and widely patent.  LEFT VENTRICULOGRAM: Left ventricular angiogram was not done due to elevated LVEDP. LVEDP was 34 mmHg.  PCI NARRATIVE: A CLS 3.0 guiding catheter was used to engage the left main. A pro-water wire was placed across the area disease in the LAD. A fetch thrombectomy catheter was used to perform aspiration thrombectomy. There was significant thrombus removal. A second pass was performed with even more thrombus removal. TIMI-3 flow was restored after the first pass. There was a residual mid vessel lesion which was about 12 mm in length, up to 80%. Just past this, there was another segment of moderate disease. The more proximal area was ballooned with a 2.5 x 12 balloon. The entire area was stented with a 2.75 x 28 Promus drug-eluting stent, postdilated to greater than 3 mm in diameter. There was some sluggish flow in the very apical LAD. Multiple doses of IC adenosine were given. TIMI-3 flow was present at the end of the procedure. The patient was pain-free.  IMPRESSIONS:  1. Normal left main coronary artery. 2. Occluded mid left anterior descending artery which was the culprit for today's presentation. This was successfully treated with a 2.75 x 28 Promus drug-eluting stent, postdilated to greater than 3 mm in diameter. 3. Mild disease in the left circumflex artery and its branches. 4. Mild to moderate disease in the right coronary artery. 5. Left ventricular systolic function was not assessed. LVEDP 34 mmHg.  RECOMMENDATION: We'll watch her in the ICU. Start low-dose beta blocker. Start high-dose statin. She needs aggressive secondary prevention. Of note, during the case, we checked about the safety of dual antiplatelet therapy with Dr. Ellene Route, as the patient has small brain aneurysms per her report. She was thought to be appropriate for prolonged dual antiplatelet therapy. She will need dual antiplatelet therapy for at least a year. Check  echocardiogram to evaluate LV function.    07/2014 Echo Study Conclusions  - Left ventricle: The cavity size was normal. Wall thickness was normal. Systolic function was normal. The estimated ejection fraction was in the range of 55% to 60%. There is mild hypokinesis of the mid-apicalanteroseptal myocardium. Doppler parameters are consistent with abnormal left ventricular relaxation (grade 1 diastolic dysfunction). Doppler parameters are consistent with elevated ventricular end-diastolic filling pressure. - Aortic valve: Mildly calcified annulus. Trileaflet. - Left atrium: The atrium was mildly  dilated. - Right atrium: Central venous pressure (est): 3 mm Hg. - Atrial septum: There was increased thickness of the septum, consistent with lipomatous hypertrophy. - Pulmonary arteries: Systolic pressure could not be accurately estimated. - Pericardium, extracardiac: There was no pericardial effusion.  Impressions:  - Normal LV wall thickness with LVEF 55-60%, mild mid to apical anteroseptal hypokinesis, and grade 1 diastolic dysfunction. There has been improvement in LVEF compared to the prior study from December 2015. Mild left atrial enlargement. Unable to assess PASP.   11/2015 Nuclear Stress  There was no ST segment deviation noted during stress.  The study is normal. There are no perfusion defects consistent with prior infarction or current ischemia.  This is a low risk study.  The left ventricular ejection fraction is normal (55-65%).   10/2016 Nuclear stress  Blood pressure demonstrated a normal response to exercise.  There was no ST segment deviation noted during stress.  Findings consistent with prior anterior/anteroseptal myocardial infarction with mild peri-infarct ischemia. There is mild apical ischemia  This is a low risk study. Overall small amount of myocardium currently at jeopardy.  The left ventricular ejection fraction  is normal (55-65%).     Assessment and Plan  1. CAD/ICM with other forms of angina -recent chest pain symptoms. Somewhat atypical but her angina including her STEMI in the past has typically been atypical. She is diabetic. Stress test 10/2016 with infarct and mild ischemia. Did well for a period with imdur, but recurrence of pain that is progression. She is on multiple antianginals currently - EKG today SR, no ischemic changes - plan for left heart cath to further evaluate    I have reviewed the risks, indications, and alternatives to cardiac catheterization, possible angioplasty, and stenting with the patient  today. Risks include but are not limited to bleeding, infection, vascular injury, stroke, myocardial infection, arrhythmia, kidney injury, radiation-related injury in the case of prolonged fluoroscopy use, emergency cardiac surgery, and death. The patient understands the risks of serious complication is 1-2 in 0352 with diagnostic cardiac cath and 1-2% or less with angioplasty/stenting.        Arnoldo Lenis, M.D

## 2017-11-10 NOTE — Progress Notes (Signed)
Clinical Summary Casey Avila is a 72 y.o.female seen today for follow up of the following medical problems.This is a focused visit on history of CAD and recent chest pain.   1. CAD/ICM - admitted with anterior STEMI 05/02/14, s/p DES to LAD. LVEF 40% by echo at that time. Repeat echo 07/2014 shows LVEF 55-60%.   10/2016 nuclear stress: prior anterior/anteroseptal scar, mild peri-infarct ischemia. Mild apical ischemia. Low risk study - chest pain symptoms improved on imdur initially.   - since last visit worsening chest pain.  - pain ongoing x few weeks. Bilateral shoudler into left chest. Sharp pain, 8/10 in severity. Felt nauseous. No change in breathing. Had some dizziness. Not positional. Lasted about 30 minutes. Layed and down relaxed. No current pain. - she reports history of 3 ruptured disc in back, different chronic back pain - has had some increased diaphoresis with activities which is new for her.  - she reports her prior MI was mainly pain in bilateral shoulders. Pain can radiate - compliant with meds.       Past Medical History:  Diagnosis Date  . Aneurysm ([place])    brain aneurysms x 2 x several yrs  . Coronary artery disease   . DDD (degenerative disc disease), cervical   . Diabetes mellitus without complication ([place])   . GERD (gastroesophageal reflux disease)   . HTN (hypertension)   . Hyperlipidemia   . Ischemic cardiomyopathy 05/02/2014  . Osteoarthritis   . Osteoporosis   . ST elevation (STEMI) myocardial infarction involving left anterior descending coronary artery ([place]) 05/02/2014  . Ulcerative colitis ([place])      Allergies  Allergen Reactions  . Clindamycin/Lincomycin Itching and Rash    Had to go into ER   . Bactrim [Sulfamethoxazole-Trimethoprim] Rash  . Morphine And Related     Makes me sick      Current Outpatient Medications  Medication Sig Dispense Refill  . aspirin 81 MG chewable tablet Chew 1 tablet (81 mg total) by mouth daily.      Marland Kitchen atorvastatin (LIPITOR) 80 MG tablet Take 1 tablet (80 mg total) by mouth daily. 90 tablet 1  . baclofen (LIORESAL) 10 MG tablet Take 10 mg by mouth 2 (two) times daily.    . carvedilol (COREG) 6.25 MG tablet Take 1 tablet (6.25 mg total) by mouth 2 (two) times daily with a meal. 180 tablet 1  . Cholecalciferol (VITAMIN D) 2000 units CAPS Take by mouth daily.    . Cyanocobalamin 1000 MCG/ML KIT Inject as directed every 30 (thirty) days.    . enalapril (VASOTEC) 5 MG tablet Take 0.5 tablets (2.5 mg total) by mouth 2 (two) times daily.    Marland Kitchen esomeprazole (NEXIUM) 40 MG capsule Take 40 mg by mouth daily at 12 noon.    . furosemide (LASIX) 40 MG tablet TAKE 1 TAB DAILY AS NEEDED 90 tablet 1  . HYDROcodone-acetaminophen (NORCO) 7.5-325 MG tablet Take 1 tablet by mouth 2 (two) times daily.  0  . isosorbide mononitrate (IMDUR) 30 MG 24 hr tablet Take 1 tablet (30 mg total) by mouth daily. 90 tablet 1  . lidocaine (LIDODERM) 5 % Place 3 patches onto the skin daily. Remove & Discard patch within 12 hours or as directed by MD    . nitroGLYCERIN (NITROSTAT) 0.4 MG SL tablet Place 1 tablet (0.4 mg total) under the tongue every 5 (five) minutes as needed for chest pain. 25 tablet 3  . potassium chloride SA (K-DUR,KLOR-CON) 20  MEQ tablet Take 1 tablet (20 mEq total) by mouth as directed. On the days that you take your as needed Lasix 90 tablet 1  . sitaGLIPtin (JANUVIA) 100 MG tablet Take 100 mg by mouth daily.    Marland Kitchen spironolactone (ALDACTONE) 25 MG tablet Take 0.5 tablets (12.5 mg total) by mouth daily. 45 tablet 1  . topiramate (TOPAMAX) 100 MG tablet Take 100 mg by mouth daily.    . traZODone (DESYREL) 50 MG tablet Take 50 mg by mouth at bedtime.    Marland Kitchen venlafaxine (EFFEXOR) 75 MG tablet Take 75 mg by mouth at bedtime.      No current facility-administered medications for this visit.      Past Surgical History:  Procedure Laterality Date  . ABDOMINAL HYSTERECTOMY    . BLADDER SUSPENSION    . CARDIAC  CATHETERIZATION    . LEFT HEART CATHETERIZATION WITH CORONARY ANGIOGRAM N/A 05/02/2014   Procedure: LEFT HEART CATHETERIZATION WITH CORONARY ANGIOGRAM;  Surgeon: Casey Booze, MD;  Location: [facility] CATH LAB;  Service: Cardiovascular;  Laterality: N/A;  . NECK SURGERY     x 2  . PTCA       Allergies  Allergen Reactions  . Clindamycin/Lincomycin Itching and Rash    Had to go into ER   . Bactrim [Sulfamethoxazole-Trimethoprim] Rash  . Morphine And Related     Makes me sick       Family History  Problem Relation Age of Onset  . Anuerysm Mother   . Alcoholism Father   . Anuerysm Other   . Colon cancer Son      Social History Casey Avila reports that she quit smoking about 3 years ago. Her smoking use included cigarettes. She started smoking about 51 years ago. She has a 72.00 pack-year smoking history. She has never used smokeless tobacco. Casey Avila reports that she does not drink alcohol.   Review of Systems CONSTITUTIONAL: No weight loss, fever, chills, weakness or fatigue.  HEENT: Eyes: No visual loss, blurred vision, double vision or yellow sclerae.No hearing loss, sneezing, congestion, runny nose or sore throat.  SKIN: No rash or itching.  CARDIOVASCULAR: per hpi RESPIRATORY: No shortness of breath, cough or sputum.  GASTROINTESTINAL: No anorexia, nausea, vomiting or diarrhea. No abdominal pain or blood.  GENITOURINARY: No burning on urination, no polyuria NEUROLOGICAL: No headache, dizziness, syncope, paralysis, ataxia, numbness or tingling in the extremities. No change in bowel or bladder control.  MUSCULOSKELETAL: No muscle, back pain, joint pain or stiffness.  LYMPHATICS: No enlarged nodes. No history of splenectomy.  PSYCHIATRIC: No history of depression or anxiety.  ENDOCRINOLOGIC: No reports of sweating, cold or heat intolerance. No polyuria or polydipsia.  Marland Kitchen   Physical Examination Vitals:   11/10/17 1421  BP: 112/70  Pulse: 82  SpO2: 97%   Vitals:     11/10/17 1421  Weight: 204 lb 6.4 oz (92.7 kg)  Height: 5' 4"  (1.626 m)    Gen: resting comfortably, no acute distress HEENT: no scleral icterus, pupils equal round and reactive, no palptable cervical adenopathy,  CV: RRR, no m/r/g, no jvd Resp: Clear to auscultation bilaterally GI: abdomen is soft, non-tender, non-distended, normal bowel sounds, no hepatosplenomegaly MSK: extremities are warm, no edema.  Skin: warm, no rash Neuro:  no focal deficits Psych: appropriate affect   Diagnostic Studies 04/2014 Echo Study Conclusions  - Left ventricle: Can not rule out apical clot. Severe hypokinesis mid/apical inferior segments, mid/apical anterior segments, mid anteroseptal segment. The cavity size was  normal. Wall thickness was increased in a pattern of mild LVH. The estimated ejection fraction was 40%. Doppler parameters are consistent with abnormal left ventricular relaxation (grade 1 diastolic dysfunction). - Right ventricle: The cavity size was normal. Systolic function was normal.  04/2014 Cath  HEMODYNAMICS: Aortic pressure was 132/67; LV pressure was 136/19; LVEDP 34. There was no gradient between the left ventricle and aorta.   ANGIOGRAPHIC DATA: The left main coronary artery is widely patent.  The left anterior descending artery is a large vessel. The vessel is occluded in the midportion. After intervention, it was noted that there was disease just past the occlusion. The remainder of the mid to distal LAD was widely patent. There were several small diagonals which were patent.  The left circumflex artery is a large vessel with mild irregularities proximally. There is a large first obtuse marginal which is widely patent. The second obtuse marginal is large and widely patent.  The right coronary artery is a large dominant vessel. In the mid vessel, there is an eccentric 40% stenosis. The posterior descending artery is large and widely patent. The  posterior lateral artery is large and widely patent.  LEFT VENTRICULOGRAM: Left ventricular angiogram was not done due to elevated LVEDP. LVEDP was 34 mmHg.  PCI NARRATIVE: A CLS 3.0 guiding catheter was used to engage the left main. A pro-water wire was placed across the area disease in the LAD. A fetch thrombectomy catheter was used to perform aspiration thrombectomy. There was significant thrombus removal. A second pass was performed with even more thrombus removal. TIMI-3 flow was restored after the first pass. There was a residual mid vessel lesion which was about 12 mm in length, up to 80%. Just past this, there was another segment of moderate disease. The more proximal area was ballooned with a 2.5 x 12 balloon. The entire area was stented with a 2.75 x 28 Promus drug-eluting stent, postdilated to greater than 3 mm in diameter. There was some sluggish flow in the very apical LAD. Multiple doses of IC adenosine were given. TIMI-3 flow was present at the end of the procedure. The patient was pain-free.  IMPRESSIONS:  1. Normal left main coronary artery. 2. Occluded mid left anterior descending artery which was the culprit for today's presentation. This was successfully treated with a 2.75 x 28 Promus drug-eluting stent, postdilated to greater than 3 mm in diameter. 3. Mild disease in the left circumflex artery and its branches. 4. Mild to moderate disease in the right coronary artery. 5. Left ventricular systolic function was not assessed. LVEDP 34 mmHg.  RECOMMENDATION: We'll watch her in the ICU. Start low-dose beta blocker. Start high-dose statin. She needs aggressive secondary prevention. Of note, during the case, we checked about the safety of dual antiplatelet therapy with Dr. Ellene Route, as the patient has small brain aneurysms per her report. She was thought to be appropriate for prolonged dual antiplatelet therapy. She will need dual antiplatelet therapy for at least a year. Check  echocardiogram to evaluate LV function.    07/2014 Echo Study Conclusions  - Left ventricle: The cavity size was normal. Wall thickness was normal. Systolic function was normal. The estimated ejection fraction was in the range of 55% to 60%. There is mild hypokinesis of the mid-apicalanteroseptal myocardium. Doppler parameters are consistent with abnormal left ventricular relaxation (grade 1 diastolic dysfunction). Doppler parameters are consistent with elevated ventricular end-diastolic filling pressure. - Aortic valve: Mildly calcified annulus. Trileaflet. - Left atrium: The atrium was mildly  dilated. - Right atrium: Central venous pressure (est): 3 mm Hg. - Atrial septum: There was increased thickness of the septum, consistent with lipomatous hypertrophy. - Pulmonary arteries: Systolic pressure could not be accurately estimated. - Pericardium, extracardiac: There was no pericardial effusion.  Impressions:  - Normal LV wall thickness with LVEF 55-60%, mild mid to apical anteroseptal hypokinesis, and grade 1 diastolic dysfunction. There has been improvement in LVEF compared to the prior study from December 2015. Mild left atrial enlargement. Unable to assess PASP.   11/2015 Nuclear Stress  There was no ST segment deviation noted during stress.  The study is normal. There are no perfusion defects consistent with prior infarction or current ischemia.  This is a low risk study.  The left ventricular ejection fraction is normal (55-65%).   10/2016 Nuclear stress  Blood pressure demonstrated a normal response to exercise.  There was no ST segment deviation noted during stress.  Findings consistent with prior anterior/anteroseptal myocardial infarction with mild peri-infarct ischemia. There is mild apical ischemia  This is a low risk study. Overall small amount of myocardium currently at jeopardy.  The left ventricular ejection fraction  is normal (55-65%).     Assessment and Plan  1. CAD/ICM with other forms of angina -recent chest pain symptoms. Somewhat atypical but her angina including her STEMI in the past has typically been atypical. She is diabetic. Stress test 10/2016 with infarct and mild ischemia. Did well for a period with imdur, but recurrence of pain that is progression. She is on multiple antianginals currently - EKG today SR, no ischemic changes - plan for left heart cath to further evaluate    I have reviewed the risks, indications, and alternatives to cardiac catheterization, possible angioplasty, and stenting with the patient  today. Risks include but are not limited to bleeding, infection, vascular injury, stroke, myocardial infection, arrhythmia, kidney injury, radiation-related injury in the case of prolonged fluoroscopy use, emergency cardiac surgery, and death. The patient understands the risks of serious complication is 1-2 in 4599 with diagnostic cardiac cath and 1-2% or less with angioplasty/stenting.        Arnoldo Lenis, M.D

## 2017-11-10 NOTE — Patient Instructions (Addendum)
Medication Instructions:   Increase Imdur to 45mg  daily.  Continue all other current medications.  Labwork:   Testing/Procedures: Your physician has requested that you have a cardiac catheterization. Cardiac catheterization is used to diagnose and/or treat various heart conditions. Doctors may recommend this procedure for a number of different reasons. The most common reason is to evaluate chest pain. Chest pain can be a symptom of coronary artery disease (CAD), and cardiac catheterization can show whether plaque is narrowing or blocking your heart's arteries. This procedure is also used to evaluate the valves, as well as measure the blood flow and oxygen levels in different parts of your heart. For further information please visit [email or web address]. Please follow instruction sheet, as given.  Follow-Up: 1 month   Any Other Special Instructions Will Be Listed Below (If Applicable).  If you need a refill on your cardiac medications before your next appointment, please call your pharmacy.

## 2017-11-13 ENCOUNTER — Telehealth: Payer: Self-pay | Admitting: *Deleted

## 2017-11-13 NOTE — Telephone Encounter (Signed)
Pt contacted pre-catheterization scheduled at [facility] for: Tuesday June 25,2019 1:30 PM Verified arrival time and place: [address] Entrance A at: 11:30 AM  No solid food after midnight prior to cath, clear liquids until 5 AM day of procedure. Verified allergies in Epic  Hold: Metformin 11/14/17 and 48 hours post procedure Spironolactone AM of procedure Furosemide AM of procedure KCl AM of procedure  Except hold medications AM meds can be  taken pre-cath with sip of water including: ASA 81 mg  Confirmed patient has responsible person to drive home post procedure and observe patient for 24 hours: yes

## 2017-11-14 ENCOUNTER — Ambulatory Visit (HOSPITAL_COMMUNITY)
Admission: RE | Disposition: A | Discharge status: Home / Self Care | Payer: Self-pay | Source: Ambulatory Visit | Attending: Interventional Cardiology

## 2017-11-14 ENCOUNTER — Ambulatory Visit (HOSPITAL_COMMUNITY)
Admission: RE | Admit: 2017-11-14 | Discharge: 2017-11-14 | Disposition: A | Discharge status: Home / Self Care | Payer: Medicare Other | Source: Ambulatory Visit | Attending: Interventional Cardiology | Admitting: Interventional Cardiology

## 2017-11-14 DIAGNOSIS — F17200 Nicotine dependence, unspecified, uncomplicated: Secondary | ICD-10-CM | POA: Diagnosis present

## 2017-11-14 DIAGNOSIS — I255 Ischemic cardiomyopathy: Secondary | ICD-10-CM | POA: Diagnosis not present

## 2017-11-14 DIAGNOSIS — Z955 Presence of coronary angioplasty implant and graft: Secondary | ICD-10-CM | POA: Insufficient documentation

## 2017-11-14 DIAGNOSIS — Z8249 Family history of ischemic heart disease and other diseases of the circulatory system: Secondary | ICD-10-CM | POA: Diagnosis not present

## 2017-11-14 DIAGNOSIS — Z881 Allergy status to other antibiotic agents status: Secondary | ICD-10-CM | POA: Diagnosis not present

## 2017-11-14 DIAGNOSIS — I25118 Atherosclerotic heart disease of native coronary artery with other forms of angina pectoris: Secondary | ICD-10-CM | POA: Diagnosis not present

## 2017-11-14 DIAGNOSIS — Z882 Allergy status to sulfonamides status: Secondary | ICD-10-CM | POA: Insufficient documentation

## 2017-11-14 DIAGNOSIS — I10 Essential (primary) hypertension: Secondary | ICD-10-CM | POA: Diagnosis present

## 2017-11-14 DIAGNOSIS — Z885 Allergy status to narcotic agent status: Secondary | ICD-10-CM | POA: Insufficient documentation

## 2017-11-14 DIAGNOSIS — Z9889 Other specified postprocedural states: Secondary | ICD-10-CM | POA: Insufficient documentation

## 2017-11-14 DIAGNOSIS — Z90710 Acquired absence of both cervix and uterus: Secondary | ICD-10-CM | POA: Diagnosis not present

## 2017-11-14 DIAGNOSIS — I252 Old myocardial infarction: Secondary | ICD-10-CM | POA: Insufficient documentation

## 2017-11-14 DIAGNOSIS — Z79899 Other long term (current) drug therapy: Secondary | ICD-10-CM | POA: Diagnosis not present

## 2017-11-14 DIAGNOSIS — R0789 Other chest pain: Secondary | ICD-10-CM

## 2017-11-14 DIAGNOSIS — Z7982 Long term (current) use of aspirin: Secondary | ICD-10-CM | POA: Insufficient documentation

## 2017-11-14 DIAGNOSIS — M1990 Unspecified osteoarthritis, unspecified site: Secondary | ICD-10-CM | POA: Diagnosis not present

## 2017-11-14 DIAGNOSIS — M5030 Other cervical disc degeneration, unspecified cervical region: Secondary | ICD-10-CM | POA: Diagnosis not present

## 2017-11-14 DIAGNOSIS — Z800 Family history of malignant neoplasm of digestive organs: Secondary | ICD-10-CM | POA: Insufficient documentation

## 2017-11-14 DIAGNOSIS — E785 Hyperlipidemia, unspecified: Secondary | ICD-10-CM | POA: Diagnosis not present

## 2017-11-14 DIAGNOSIS — E119 Type 2 diabetes mellitus without complications: Secondary | ICD-10-CM | POA: Insufficient documentation

## 2017-11-14 DIAGNOSIS — I2510 Atherosclerotic heart disease of native coronary artery without angina pectoris: Secondary | ICD-10-CM

## 2017-11-14 DIAGNOSIS — K219 Gastro-esophageal reflux disease without esophagitis: Secondary | ICD-10-CM | POA: Insufficient documentation

## 2017-11-14 DIAGNOSIS — Z87891 Personal history of nicotine dependence: Secondary | ICD-10-CM | POA: Diagnosis not present

## 2017-11-14 HISTORY — PX: LEFT HEART CATH AND CORONARY ANGIOGRAPHY: CATH118249

## 2017-11-14 LAB — BASIC METABOLIC PANEL
Anion gap: 7 (ref 5–15)
BUN: 12 mg/dL (ref 8–23)
CHLORIDE: 111 mmol/L (ref 98–111)
CO2: 24 mmol/L (ref 22–32)
Calcium: 8.8 mg/dL — ABNORMAL LOW (ref 8.9–10.3)
Creatinine, Ser: 0.75 mg/dL (ref 0.44–1.00)
GFR calc Af Amer: >60 mL/min (ref >60)
GFR calc non Af Amer: >60 mL/min (ref >60)
GLUCOSE: 124 mg/dL — AB (ref 70–99)
POTASSIUM: 4.5 mmol/L (ref 3.5–5.1)
Sodium: 142 mmol/L (ref 135–145)

## 2017-11-14 LAB — CBC
HEMATOCRIT: 38.4 % (ref 36.0–46.0)
Hemoglobin: 12.8 g/dL (ref 12.0–15.0)
MCH: 30.3 pg (ref 26.0–34.0)
MCHC: 33.3 g/dL (ref 30.0–36.0)
MCV: 91 fL (ref 78.0–100.0)
Platelets: 240 10*3/uL (ref 150–400)
RBC: 4.22 MIL/uL (ref 3.87–5.11)
RDW: 13.2 % (ref 11.5–15.5)
WBC: 10.9 10*3/uL — ABNORMAL HIGH (ref 4.0–10.5)

## 2017-11-14 LAB — GLUCOSE, CAPILLARY
GLUCOSE-CAPILLARY: 89 mg/dL (ref 70–99)
Glucose-Capillary: 125 mg/dL — ABNORMAL HIGH (ref 70–99)

## 2017-11-14 SURGERY — LEFT HEART CATH AND CORONARY ANGIOGRAPHY
Anesthesia: LOCAL

## 2017-11-14 MED ORDER — ACETAMINOPHEN 325 MG PO TABS: 650.0000 mg | ORAL_TABLET | ORAL | Status: DC | PRN

## 2017-11-14 MED ORDER — SODIUM CHLORIDE 0.9% FLUSH: 3.0000 mL | INTRAVENOUS | Status: DC | PRN

## 2017-11-14 MED ORDER — ASPIRIN 81 MG PO CHEW: 81.0000 mg | CHEWABLE_TABLET | Freq: Every day | ORAL | Status: DC

## 2017-11-14 MED ORDER — LIDOCAINE HCL (PF) 1 % IJ SOLN
INTRAMUSCULAR | Status: AC
  Filled 2017-11-14: qty 30

## 2017-11-14 MED ORDER — SODIUM CHLORIDE 0.9 % IV SOLN: 250.0000 mL | INTRAVENOUS | Status: DC | PRN

## 2017-11-14 MED ORDER — MIDAZOLAM HCL 2 MG/2ML IJ SOLN
INTRAMUSCULAR | Status: AC
  Filled 2017-11-14: qty 2

## 2017-11-14 MED ORDER — SODIUM CHLORIDE 0.9 % WEIGHT BASED INFUSION: 1.0000 mL/kg/h | INTRAVENOUS | Status: DC

## 2017-11-14 MED ORDER — SODIUM CHLORIDE 0.9 % WEIGHT BASED INFUSION
3.0000 mL/kg/h | INTRAVENOUS | Status: AC
  Administered 2017-11-14: 3 mL/kg/h via INTRAVENOUS

## 2017-11-14 MED ORDER — MIDAZOLAM HCL 2 MG/2ML IJ SOLN
INTRAMUSCULAR | Status: DC | PRN
  Administered 2017-11-14 (×2): 1 mg via INTRAVENOUS

## 2017-11-14 MED ORDER — FENTANYL CITRATE (PF) 100 MCG/2ML IJ SOLN
INTRAMUSCULAR | Status: DC | PRN
  Administered 2017-11-14: 50 ug via INTRAVENOUS

## 2017-11-14 MED ORDER — ONDANSETRON HCL 4 MG/2ML IJ SOLN: 4.0000 mg | Freq: Four times a day (QID) | INTRAMUSCULAR | Status: DC | PRN

## 2017-11-14 MED ORDER — IOHEXOL 350 MG/ML SOLN
INTRAVENOUS | Status: DC | PRN
  Administered 2017-11-14: 60 mL via INTRA_ARTERIAL

## 2017-11-14 MED ORDER — HEPARIN (PORCINE) IN NACL 1000-0.9 UT/500ML-% IV SOLN
INTRAVENOUS | Status: AC
  Filled 2017-11-14: qty 1000

## 2017-11-14 MED ORDER — HEPARIN SODIUM (PORCINE) 1000 UNIT/ML IJ SOLN
INTRAMUSCULAR | Status: AC
  Filled 2017-11-14: qty 1

## 2017-11-14 MED ORDER — LIDOCAINE HCL (PF) 1 % IJ SOLN
INTRAMUSCULAR | Status: DC | PRN
  Administered 2017-11-14: 2 mL via INTRADERMAL

## 2017-11-14 MED ORDER — ASPIRIN 81 MG PO CHEW: 81.0000 mg | CHEWABLE_TABLET | ORAL | Status: DC

## 2017-11-14 MED ORDER — HEPARIN SODIUM (PORCINE) 1000 UNIT/ML IJ SOLN
INTRAMUSCULAR | Status: DC | PRN
  Administered 2017-11-14: 5000 [IU] via INTRAVENOUS

## 2017-11-14 MED ORDER — SODIUM CHLORIDE 0.9 % IV SOLN
INTRAVENOUS | Status: DC
  Administered 2017-11-14: 16:00:00 via INTRAVENOUS

## 2017-11-14 MED ORDER — VERAPAMIL HCL 2.5 MG/ML IV SOLN
INTRAVENOUS | Status: DC | PRN
  Administered 2017-11-14: 10 mL via INTRA_ARTERIAL

## 2017-11-14 MED ORDER — VERAPAMIL HCL 2.5 MG/ML IV SOLN
INTRAVENOUS | Status: AC
  Filled 2017-11-14: qty 2

## 2017-11-14 MED ORDER — HEPARIN (PORCINE) IN NACL 2-0.9 UNITS/ML
INTRAMUSCULAR | Status: AC | PRN
  Administered 2017-11-14 (×2): 500 mL via INTRA_ARTERIAL

## 2017-11-14 MED ORDER — SODIUM CHLORIDE 0.9% FLUSH: 3.0000 mL | Freq: Two times a day (BID) | INTRAVENOUS | Status: DC

## 2017-11-14 MED ORDER — FENTANYL CITRATE (PF) 100 MCG/2ML IJ SOLN
INTRAMUSCULAR | Status: AC
  Filled 2017-11-14: qty 2

## 2017-11-14 SURGICAL SUPPLY — 11 items
CATH 5FR JL3.5 JR4 ANG PIG MP (CATHETERS) ×2 IMPLANT
COVER PRB 48X5XTLSCP FOLD TPE (BAG) ×1 IMPLANT
COVER PROBE 5X48 (BAG) ×1
DEVICE RAD COMP TR BAND LRG (VASCULAR PRODUCTS) ×2 IMPLANT
GLIDESHEATH SLEND A-KIT 6F 22G (SHEATH) ×2 IMPLANT
GUIDEWIRE INQWIRE 1.5J.035X260 (WIRE) ×1 IMPLANT
INQWIRE 1.5J .035X260CM (WIRE) ×2
KIT HEART LEFT (KITS) ×2 IMPLANT
PACK CARDIAC CATHETERIZATION (CUSTOM PROCEDURE TRAY) ×2 IMPLANT
TRANSDUCER W/STOPCOCK (MISCELLANEOUS) ×2 IMPLANT
TUBING CIL FLEX 10 FLL-RA (TUBING) ×2 IMPLANT

## 2017-11-14 NOTE — Discharge Instructions (Signed)
NO METFORMIN/GLUCOPHAGE FOR 2 DAYS ° ° °Radial Site Care °Refer to this sheet in the next few weeks. These instructions provide you with information about caring for yourself after your procedure. Your health care provider may also give you more specific instructions. Your treatment has been planned according to current medical practices, but problems sometimes occur. Call your health care provider if you have any problems or questions after your procedure. °What can I expect after the procedure? °After your procedure, it is typical to have the following: °· Bruising at the radial site that usually fades within 1-2 weeks. °· Blood collecting in the tissue (hematoma) that may be painful to the touch. It should usually decrease in size and tenderness within 1-2 weeks. ° °Follow these instructions at home: °· Take medicines only as directed by your health care provider. °· You may shower 24-48 hours after the procedure or as directed by your health care provider. Remove the bandage (dressing) and gently wash the site with plain soap and water. Pat the area dry with a clean towel. Do not rub the site, because this may cause bleeding. °· Do not take baths, swim, or use a hot tub until your health care provider approves. °· Check your insertion site every day for redness, swelling, or drainage. °· Do not apply powder or lotion to the site. °· Do not flex or bend the affected arm for 24 hours or as directed by your health care provider. °· Do not push or pull heavy objects with the affected arm for 24 hours or as directed by your health care provider. °· Do not lift over 10 lb (4.5 kg) for 5 days after your procedure or as directed by your health care provider. °· Ask your health care provider when it is okay to: °? Return to work or school. °? Resume usual physical activities or sports. °? Resume sexual activity. °· Do not drive home if you are discharged the same day as the procedure. Have someone else drive you. °· You  may drive 24 hours after the procedure unless otherwise instructed by your health care provider. °· Do not operate machinery or power tools for 24 hours after the procedure. °· If your procedure was done as an outpatient procedure, which means that you went home the same day as your procedure, a responsible adult should be with you for the first 24 hours after you arrive home. °· Keep all follow-up visits as directed by your health care provider. This is important. °Contact a health care provider if: °· You have a fever. °· You have chills. °· You have increased bleeding from the radial site. Hold pressure on the site. °Get help right away if: °· You have unusual pain at the radial site. °· You have redness, warmth, or swelling at the radial site. °· You have drainage (other than a small amount of blood on the dressing) from the radial site. °· The radial site is bleeding, and the bleeding does not stop after 30 minutes of holding steady pressure on the site. °· Your arm or hand becomes pale, cool, tingly, or numb. °This information is not intended to replace advice given to you by your health care provider. Make sure you discuss any questions you have with your health care provider. °Document Released: 06/11/2010 Document Revised: 10/15/2015 Document Reviewed: 11/25/2013 °Elsevier Interactive Patient Education © 2018 [employer] ° °

## 2017-11-14 NOTE — Interval H&P Note (Signed)
Cath Lab Visit (complete for each Cath Lab visit)  Clinical Evaluation Leading to the Procedure:   ACS: No.  Non-ACS:    Anginal Classification: CCS III  Anti-ischemic medical therapy: Minimal Therapy (1 class of medications)  Non-Invasive Test Results: Low-risk stress test findings: cardiac mortality <1%/year  Prior CABG: No previous CABG      History and Physical Interval Note:  11/14/2017 2:38 PM  Casey Avila  has presented today for surgery, with the diagnosis of cp  The various methods of treatment have been discussed with the patient and family. After consideration of risks, benefits and other options for treatment, the patient has consented to  Procedure(s): LEFT HEART CATH AND CORONARY ANGIOGRAPHY (N/A) as a surgical intervention .  The patient's history has been reviewed, patient examined, no change in status, stable for surgery.  I have reviewed the patient's chart and labs.  Questions were answered to the patient's satisfaction.     Belva Crome III

## 2017-11-15 ENCOUNTER — Encounter (HOSPITAL_COMMUNITY): Payer: Self-pay | Admitting: Interventional Cardiology

## 2017-11-15 MED FILL — Heparin Sod (Porcine)-NaCl IV Soln 1000 Unit/500ML-0.9%: INTRAVENOUS | Qty: 1000 | Status: AC

## 2017-11-24 DIAGNOSIS — E1165 Type 2 diabetes mellitus with hyperglycemia: Secondary | ICD-10-CM | POA: Diagnosis not present

## 2017-11-24 DIAGNOSIS — I255 Ischemic cardiomyopathy: Secondary | ICD-10-CM | POA: Diagnosis not present

## 2017-11-24 DIAGNOSIS — M549 Dorsalgia, unspecified: Secondary | ICD-10-CM | POA: Diagnosis not present

## 2017-11-24 DIAGNOSIS — I2510 Atherosclerotic heart disease of native coronary artery without angina pectoris: Secondary | ICD-10-CM | POA: Diagnosis not present

## 2017-11-24 DIAGNOSIS — Z6834 Body mass index (BMI) 34.0-34.9, adult: Secondary | ICD-10-CM | POA: Diagnosis not present

## 2017-11-24 DIAGNOSIS — B001 Herpesviral vesicular dermatitis: Secondary | ICD-10-CM | POA: Diagnosis not present

## 2017-11-24 DIAGNOSIS — Z299 Encounter for prophylactic measures, unspecified: Secondary | ICD-10-CM | POA: Diagnosis not present

## 2017-12-11 DIAGNOSIS — M5440 Lumbago with sciatica, unspecified side: Secondary | ICD-10-CM | POA: Diagnosis not present

## 2017-12-11 DIAGNOSIS — M546 Pain in thoracic spine: Secondary | ICD-10-CM | POA: Diagnosis not present

## 2017-12-11 DIAGNOSIS — M545 Low back pain: Secondary | ICD-10-CM | POA: Diagnosis not present

## 2017-12-11 DIAGNOSIS — Z6834 Body mass index (BMI) 34.0-34.9, adult: Secondary | ICD-10-CM | POA: Diagnosis not present

## 2017-12-12 ENCOUNTER — Ambulatory Visit (INDEPENDENT_AMBULATORY_CARE_PROVIDER_SITE_OTHER): Payer: Medicare Other | Admitting: Cardiology

## 2017-12-12 ENCOUNTER — Encounter: Payer: Self-pay | Admitting: Cardiology

## 2017-12-12 ENCOUNTER — Other Ambulatory Visit: Payer: Self-pay

## 2017-12-12 VITALS — BP 125/85 | HR 81 | Ht 64.0 in | Wt 200.0 lb

## 2017-12-12 DIAGNOSIS — I255 Ischemic cardiomyopathy: Secondary | ICD-10-CM | POA: Diagnosis not present

## 2017-12-12 DIAGNOSIS — I2510 Atherosclerotic heart disease of native coronary artery without angina pectoris: Secondary | ICD-10-CM | POA: Diagnosis not present

## 2017-12-12 MED ORDER — ISOSORBIDE MONONITRATE ER 30 MG PO TB24: 30.0000 mg | ORAL_TABLET | Freq: Every day | ORAL | 1 refills | Status: DC

## 2017-12-12 NOTE — Patient Instructions (Signed)
Your physician wants you to follow-up in: [place] will receive a reminder letter in the mail two months in advance. If you don't receive a letter, please call our office to schedule the follow-up appointment.  Your physician has recommended you make the following change in your medication:   DECREASE IMDUR 30 MG DAILY  Thank you for choosing [facility]!!

## 2017-12-12 NOTE — Progress Notes (Signed)
Clinical Summary Casey Avila is a 72 y.o.female seen today for follow up, this is a focal visit on her recent symptoms of chest pain and recent caht.   1. CAD/ICM - admitted with anterior STEMI 05/02/14, s/p DES to LAD. LVEF 40% by echo at that time. Repeat echo 07/2014 shows LVEF 55-60%.   10/2016 nuclear stress: prior anterior/anteroseptal scar, mild peri-infarct ischemia. Mild apical ischemia. Low risk study -chest pain symptoms improved on imdur initially.   - since last visit worsening chest pain.  - pain ongoing x few weeks. Bilateral shoudler into left chest. Sharp pain, 8/10 in severity. Felt nauseous. No change in breathing. Had some dizziness. Not positional. Lasted about 30 minutes. Layed and down relaxed. No current pain. - she reports history of 3 ruptured disc in back, different chronic back pain - has had some increased diaphoresis with activities which is new for her.  - she reports her prior MI was mainly pain in bilateral shoulders. Pain can radiate - compliant with meds.    - 10/2017 cath: as reported below. Patent vessels and stent. LVEDP 22.  - still with her pains at times, appears to all be MSK related.     Past Medical History:  Diagnosis Date  . Aneurysm ([place])    brain aneurysms x 2 x several yrs  . Coronary artery disease   . DDD (degenerative disc disease), cervical   . Diabetes mellitus without complication ([place])   . GERD (gastroesophageal reflux disease)   . HTN (hypertension)   . Hyperlipidemia   . Ischemic cardiomyopathy 05/02/2014  . Osteoarthritis   . Osteoporosis   . ST elevation (STEMI) myocardial infarction involving left anterior descending coronary artery ([place]) 05/02/2014  . Ulcerative colitis ([place])      Allergies  Allergen Reactions  . Clindamycin/Lincomycin Itching and Rash    Had to go into ER   . Bactrim [Sulfamethoxazole-Trimethoprim] Rash  . Morphine And Related     Makes me sick      Current Outpatient Medications    Medication Sig Dispense Refill  . aspirin 81 MG chewable tablet Chew 1 tablet (81 mg total) by mouth daily.    Marland Kitchen atorvastatin (LIPITOR) 80 MG tablet Take 1 tablet (80 mg total) by mouth daily. 90 tablet 1  . baclofen (LIORESAL) 10 MG tablet Take 10 mg by mouth 2 (two) times daily.    . carvedilol (COREG) 6.25 MG tablet Take 1 tablet (6.25 mg total) by mouth 2 (two) times daily with a meal. 180 tablet 1  . Cholecalciferol (VITAMIN D) 2000 units CAPS Take 1 capsule by mouth daily.     . Cyanocobalamin 1000 MCG/ML KIT Inject 1 mL as directed every 30 (thirty) days.     . enalapril (VASOTEC) 5 MG tablet Take 0.5 tablets (2.5 mg total) by mouth 2 (two) times daily.    Marland Kitchen esomeprazole (NEXIUM) 40 MG capsule Take 40 mg by mouth daily.     . furosemide (LASIX) 40 MG tablet TAKE 1 TAB DAILY AS NEEDED (Patient taking differently: Take 40 mg by mouth daily as needed for fluid. TAKE 1 TAB DAILY AS NEEDED) 90 tablet 1  . HYDROcodone-acetaminophen (NORCO) 7.5-325 MG tablet Take 1 tablet by mouth 2 (two) times daily.  0  . isosorbide mononitrate (IMDUR) 30 MG 24 hr tablet Take 1.5 tablets (45 mg total) by mouth daily. 135 tablet 3  . lidocaine (LIDODERM) 5 % Place 3 patches onto the skin daily as needed (PAIN).  Remove & Discard patch within 12 hours or as directed by MD     . metFORMIN (GLUCOPHAGE-XR) 500 MG 24 hr tablet Take 250 mg by mouth 2 (two) times daily.  1  . Multiple Vitamins-Minerals (PRESERVISION AREDS 2 PO) Take 1 capsule by mouth 2 (two) times daily.    . nitroGLYCERIN (NITROSTAT) 0.4 MG SL tablet Place 1 tablet (0.4 mg total) under the tongue every 5 (five) minutes as needed for chest pain. 25 tablet 3  . potassium chloride SA (K-DUR,KLOR-CON) 20 MEQ tablet Take 1 tablet (20 mEq total) by mouth as directed. On the days that you take your as needed Lasix 90 tablet 1  . spironolactone (ALDACTONE) 25 MG tablet Take 0.5 tablets (12.5 mg total) by mouth daily. 45 tablet 1  . topiramate (TOPAMAX) 100 MG  tablet Take 100 mg by mouth 2 (two) times daily.     . traZODone (DESYREL) 50 MG tablet Take 50 mg by mouth at bedtime.    Marland Kitchen venlafaxine (EFFEXOR) 75 MG tablet Take 75 mg by mouth at bedtime.      No current facility-administered medications for this visit.      Past Surgical History:  Procedure Laterality Date  . ABDOMINAL HYSTERECTOMY    . BLADDER SUSPENSION    . CARDIAC CATHETERIZATION    . LEFT HEART CATH AND CORONARY ANGIOGRAPHY N/A 11/14/2017   Procedure: LEFT HEART CATH AND CORONARY ANGIOGRAPHY;  Surgeon: Casey Crome, MD;  Location: [facility];  Service: Cardiovascular;  Laterality: N/A;  . LEFT HEART CATHETERIZATION WITH CORONARY ANGIOGRAM N/A 05/02/2014   Procedure: LEFT HEART CATHETERIZATION WITH CORONARY ANGIOGRAM;  Surgeon: Casey Booze, MD;  Location: [facility];  Service: Cardiovascular;  Laterality: N/A;  . NECK SURGERY     x 2  . PTCA       Allergies  Allergen Reactions  . Clindamycin/Lincomycin Itching and Rash    Had to go into ER   . Bactrim [Sulfamethoxazole-Trimethoprim] Rash  . Morphine And Related     Makes me sick       Family History  Problem Relation Age of Onset  . Anuerysm Mother   . Alcoholism Father   . Anuerysm Other   . Colon cancer Son      Social History Casey Avila reports that she quit smoking about 3 years ago. Her smoking use included cigarettes. She started smoking about 51 years ago. She has a 72.00 pack-year smoking history. She has never used smokeless tobacco. Casey Avila reports that she does not drink alcohol.   Review of Systems CONSTITUTIONAL: No weight loss, fever, chills, weakness or fatigue.  HEENT: Eyes: No visual loss, blurred vision, double vision or yellow sclerae.No hearing loss, sneezing, congestion, runny nose or sore throat.  SKIN: No rash or itching.  CARDIOVASCULAR: per hpi RESPIRATORY: No shortness of breath, cough or sputum.  GASTROINTESTINAL: No anorexia, nausea, vomiting or diarrhea.  No abdominal pain or blood.  GENITOURINARY: No burning on urination, no polyuria NEUROLOGICAL: No headache, dizziness, syncope, paralysis, ataxia, numbness or tingling in the extremities. No change in bowel or bladder control.  MUSCULOSKELETAL: neck/back pains LYMPHATICS: No enlarged nodes. No history of splenectomy.  PSYCHIATRIC: No history of depression or anxiety.  ENDOCRINOLOGIC: No reports of sweating, cold or heat intolerance. No polyuria or polydipsia.  Marland Kitchen   Physical Examination Vitals:   12/12/17 1310  BP: 125/85  Pulse: 81  SpO2: 97%   Vitals:   12/12/17 1310  Weight: 200  lb (90.7 kg)  Height: 5' 4"  (1.626 m)    Gen: resting comfortably, no acute distress HEENT: no scleral icterus, pupils equal round and reactive, no palptable cervical adenopathy,  CV: RRR, no m/rg, no jvd Resp: Clear to auscultation bilaterally GI: abdomen is soft, non-tender, non-distended, normal bowel sounds, no hepatosplenomegaly MSK: extremities are warm, no edema.  Skin: warm, no rash Neuro:  no focal deficits Psych: appropriate affect   Diagnostic Studies 04/2014 Echo Study Conclusions  - Left ventricle: Can not rule out apical clot. Severe hypokinesis mid/apical inferior segments, mid/apical anterior segments, mid anteroseptal segment. The cavity size was normal. Wall thickness was increased in a pattern of mild LVH. The estimated ejection fraction was 40%. Doppler parameters are consistent with abnormal left ventricular relaxation (grade 1 diastolic dysfunction). - Right ventricle: The cavity size was normal. Systolic function was normal.  04/2014 Cath  HEMODYNAMICS: Aortic pressure was 132/67; LV pressure was 136/19; LVEDP 34. There was no gradient between the left ventricle and aorta.   ANGIOGRAPHIC DATA: The left main coronary artery is widely patent.  The left anterior descending artery is a large vessel. The vessel is occluded in the midportion. After  intervention, it was noted that there was disease just past the occlusion. The remainder of the mid to distal LAD was widely patent. There were several small diagonals which were patent.  The left circumflex artery is a large vessel with mild irregularities proximally. There is a large first obtuse marginal which is widely patent. The second obtuse marginal is large and widely patent.  The right coronary artery is a large dominant vessel. In the mid vessel, there is an eccentric 40% stenosis. The posterior descending artery is large and widely patent. The posterior lateral artery is large and widely patent.  LEFT VENTRICULOGRAM: Left ventricular angiogram was not done due to elevated LVEDP. LVEDP was 34 mmHg.  PCI NARRATIVE: A CLS 3.0 guiding catheter was used to engage the left main. A pro-water wire was placed across the area disease in the LAD. A fetch thrombectomy catheter was used to perform aspiration thrombectomy. There was significant thrombus removal. A second pass was performed with even more thrombus removal. TIMI-3 flow was restored after the first pass. There was a residual mid vessel lesion which was about 12 mm in length, up to 80%. Just past this, there was another segment of moderate disease. The more proximal area was ballooned with a 2.5 x 12 balloon. The entire area was stented with a 2.75 x 28 Promus drug-eluting stent, postdilated to greater than 3 mm in diameter. There was some sluggish flow in the very apical LAD. Multiple doses of IC adenosine were given. TIMI-3 flow was present at the end of the procedure. The patient was pain-free.  IMPRESSIONS:  1. Normal left main coronary artery. 2. Occluded mid left anterior descending artery which was the culprit for today's presentation. This was successfully treated with a 2.75 x 28 Promus drug-eluting stent, postdilated to greater than 3 mm in diameter. 3. Mild disease in the left circumflex artery and its branches. 4. Mild  to moderate disease in the right coronary artery. 5. Left ventricular systolic function was not assessed. LVEDP 34 mmHg.  RECOMMENDATION: We'll watch her in the ICU. Start low-dose beta blocker. Start high-dose statin. She needs aggressive secondary prevention. Of note, during the case, we checked about the safety of dual antiplatelet therapy with Dr. Ellene Route, as the patient has small brain aneurysms per her report. She was thought  to be appropriate for prolonged dual antiplatelet therapy. She will need dual antiplatelet therapy for at least a year. Check echocardiogram to evaluate LV function.    07/2014 Echo Study Conclusions  - Left ventricle: The cavity size was normal. Wall thickness was normal. Systolic function was normal. The estimated ejection fraction was in the range of 55% to 60%. There is mild hypokinesis of the mid-apicalanteroseptal myocardium. Doppler parameters are consistent with abnormal left ventricular relaxation (grade 1 diastolic dysfunction). Doppler parameters are consistent with elevated ventricular end-diastolic filling pressure. - Aortic valve: Mildly calcified annulus. Trileaflet. - Left atrium: The atrium was mildly dilated. - Right atrium: Central venous pressure (est): 3 mm Hg. - Atrial septum: There was increased thickness of the septum, consistent with lipomatous hypertrophy. - Pulmonary arteries: Systolic pressure could not be accurately estimated. - Pericardium, extracardiac: There was no pericardial effusion.  Impressions:  - Normal LV wall thickness with LVEF 55-60%, mild mid to apical anteroseptal hypokinesis, and grade 1 diastolic dysfunction. There has been improvement in LVEF compared to the prior study from December 2015. Mild left atrial enlargement. Unable to assess PASP.   11/2015 Nuclear Stress  There was no ST segment deviation noted during stress.  The study is normal. There are no perfusion  defects consistent with prior infarction or current ischemia.  This is a low risk study.  The left ventricular ejection fraction is normal (55-65%).   10/2016 Nuclear stress  Blood pressure demonstrated a normal response to exercise.  There was no ST segment deviation noted during stress.  Findings consistent with prior anterior/anteroseptal myocardial infarction with mild peri-infarct ischemia. There is mild apical ischemia  This is a low risk study. Overall small amount of myocardium currently at jeopardy.  The left ventricular ejection fraction is normal (55-65%).   11/14/17 cath  Normal left main  Luminal irregularities proximal LAD, widely patent stent within the mid LAD.  No significant obstruction is noted in the LAD territory.  Luminal irregularity up to 20% in the proximal circumflex.  2 large obtuse marginal branches are widely patent.  Dominant right coronary with luminal irregularities in the mid vessel up to 30 to 40%.  Normal left ventricular systolic function with mildly elevated LVEDP of 22 mmHg consistent with diastolic heart failure.  EF 45 to 50% with apical severe hypokinesis.  Post cath RECOMMENDATIONS:   Continue aggressive risk factor modification  Consider alternative explanations for the patient's exertional shoulder discomfort and dyspnea.  Assessment and Plan    1. CAD/ICM with other forms of angina -recent chest pain symptoms, repeat cath without significant disease. Appears to be atypical pain from her chronic neck and back issues - continue current medical therapy. Lower imdur back to prior dose of 64m daily.        JArnoldo Lenis M.D.

## 2017-12-13 ENCOUNTER — Other Ambulatory Visit (HOSPITAL_COMMUNITY): Payer: Self-pay | Admitting: Neurosurgery

## 2017-12-13 DIAGNOSIS — I671 Cerebral aneurysm, nonruptured: Secondary | ICD-10-CM

## 2017-12-22 ENCOUNTER — Encounter: Payer: Self-pay | Admitting: Cardiology

## 2017-12-25 ENCOUNTER — Ambulatory Visit (HOSPITAL_COMMUNITY)
Admission: RE | Admit: 2017-12-25 | Discharge: 2017-12-25 | Disposition: A | Discharge status: Home / Self Care | Payer: Medicare Other | Source: Ambulatory Visit | Attending: Neurosurgery | Admitting: Neurosurgery

## 2017-12-25 DIAGNOSIS — I671 Cerebral aneurysm, nonruptured: Secondary | ICD-10-CM | POA: Insufficient documentation

## 2017-12-25 MED ORDER — IOPAMIDOL (ISOVUE-300) INJECTION 61%: 100.0000 mL | Freq: Once | INTRAVENOUS | Status: DC | PRN

## 2017-12-25 MED ORDER — IOPAMIDOL (ISOVUE-370) INJECTION 76%
100.0000 mL | Freq: Once | INTRAVENOUS | Status: AC | PRN
  Administered 2017-12-25: 100 mL via INTRAVENOUS

## 2017-12-29 DIAGNOSIS — M546 Pain in thoracic spine: Secondary | ICD-10-CM | POA: Diagnosis not present

## 2017-12-29 DIAGNOSIS — I671 Cerebral aneurysm, nonruptured: Secondary | ICD-10-CM | POA: Diagnosis not present

## 2017-12-29 DIAGNOSIS — M5440 Lumbago with sciatica, unspecified side: Secondary | ICD-10-CM | POA: Diagnosis not present

## 2017-12-29 DIAGNOSIS — M4316 Spondylolisthesis, lumbar region: Secondary | ICD-10-CM | POA: Diagnosis not present

## 2018-01-02 DIAGNOSIS — L02214 Cutaneous abscess of groin: Secondary | ICD-10-CM | POA: Diagnosis not present

## 2018-01-02 DIAGNOSIS — Z6835 Body mass index (BMI) 35.0-35.9, adult: Secondary | ICD-10-CM | POA: Diagnosis not present

## 2018-01-02 DIAGNOSIS — Z299 Encounter for prophylactic measures, unspecified: Secondary | ICD-10-CM | POA: Diagnosis not present

## 2018-01-02 DIAGNOSIS — I10 Essential (primary) hypertension: Secondary | ICD-10-CM | POA: Diagnosis not present

## 2018-01-02 DIAGNOSIS — E1165 Type 2 diabetes mellitus with hyperglycemia: Secondary | ICD-10-CM | POA: Diagnosis not present

## 2018-01-02 DIAGNOSIS — J449 Chronic obstructive pulmonary disease, unspecified: Secondary | ICD-10-CM | POA: Diagnosis not present

## 2018-01-02 DIAGNOSIS — E1142 Type 2 diabetes mellitus with diabetic polyneuropathy: Secondary | ICD-10-CM | POA: Diagnosis not present

## 2018-01-03 DIAGNOSIS — Z886 Allergy status to analgesic agent status: Secondary | ICD-10-CM | POA: Diagnosis not present

## 2018-01-03 DIAGNOSIS — Z7984 Long term (current) use of oral hypoglycemic drugs: Secondary | ICD-10-CM | POA: Diagnosis not present

## 2018-01-03 DIAGNOSIS — Z881 Allergy status to other antibiotic agents status: Secondary | ICD-10-CM | POA: Diagnosis not present

## 2018-01-03 DIAGNOSIS — Z7982 Long term (current) use of aspirin: Secondary | ICD-10-CM | POA: Diagnosis not present

## 2018-01-03 DIAGNOSIS — I10 Essential (primary) hypertension: Secondary | ICD-10-CM | POA: Diagnosis not present

## 2018-01-03 DIAGNOSIS — Z955 Presence of coronary angioplasty implant and graft: Secondary | ICD-10-CM | POA: Diagnosis not present

## 2018-01-03 DIAGNOSIS — I252 Old myocardial infarction: Secondary | ICD-10-CM | POA: Diagnosis not present

## 2018-01-03 DIAGNOSIS — L02214 Cutaneous abscess of groin: Secondary | ICD-10-CM | POA: Diagnosis not present

## 2018-01-03 DIAGNOSIS — E669 Obesity, unspecified: Secondary | ICD-10-CM | POA: Diagnosis not present

## 2018-01-03 DIAGNOSIS — E119 Type 2 diabetes mellitus without complications: Secondary | ICD-10-CM | POA: Diagnosis not present

## 2018-01-03 DIAGNOSIS — Z90710 Acquired absence of both cervix and uterus: Secondary | ICD-10-CM | POA: Diagnosis not present

## 2018-01-03 DIAGNOSIS — F329 Major depressive disorder, single episode, unspecified: Secondary | ICD-10-CM | POA: Diagnosis not present

## 2018-01-03 DIAGNOSIS — Z883 Allergy status to other anti-infective agents status: Secondary | ICD-10-CM | POA: Diagnosis not present

## 2018-01-03 DIAGNOSIS — I2510 Atherosclerotic heart disease of native coronary artery without angina pectoris: Secondary | ICD-10-CM | POA: Diagnosis not present

## 2018-01-03 DIAGNOSIS — Z79899 Other long term (current) drug therapy: Secondary | ICD-10-CM | POA: Diagnosis not present

## 2018-01-03 DIAGNOSIS — Z6834 Body mass index (BMI) 34.0-34.9, adult: Secondary | ICD-10-CM | POA: Diagnosis not present

## 2018-01-04 DIAGNOSIS — Z4801 Encounter for change or removal of surgical wound dressing: Secondary | ICD-10-CM | POA: Diagnosis not present

## 2018-01-05 DIAGNOSIS — Z4801 Encounter for change or removal of surgical wound dressing: Secondary | ICD-10-CM | POA: Diagnosis not present

## 2018-01-17 DIAGNOSIS — L02214 Cutaneous abscess of groin: Secondary | ICD-10-CM | POA: Diagnosis not present

## 2018-01-23 ENCOUNTER — Other Ambulatory Visit: Payer: Self-pay | Admitting: Internal Medicine

## 2018-01-23 DIAGNOSIS — Z1231 Encounter for screening mammogram for malignant neoplasm of breast: Secondary | ICD-10-CM

## 2018-01-25 DIAGNOSIS — L02214 Cutaneous abscess of groin: Secondary | ICD-10-CM | POA: Diagnosis not present

## 2018-02-06 DIAGNOSIS — M5431 Sciatica, right side: Secondary | ICD-10-CM | POA: Diagnosis not present

## 2018-02-06 DIAGNOSIS — M5432 Sciatica, left side: Secondary | ICD-10-CM | POA: Diagnosis not present

## 2018-02-07 DIAGNOSIS — Z299 Encounter for prophylactic measures, unspecified: Secondary | ICD-10-CM | POA: Diagnosis not present

## 2018-02-07 DIAGNOSIS — E1142 Type 2 diabetes mellitus with diabetic polyneuropathy: Secondary | ICD-10-CM | POA: Diagnosis not present

## 2018-02-07 DIAGNOSIS — E1165 Type 2 diabetes mellitus with hyperglycemia: Secondary | ICD-10-CM | POA: Diagnosis not present

## 2018-02-07 DIAGNOSIS — Z6835 Body mass index (BMI) 35.0-35.9, adult: Secondary | ICD-10-CM | POA: Diagnosis not present

## 2018-02-07 DIAGNOSIS — I10 Essential (primary) hypertension: Secondary | ICD-10-CM | POA: Diagnosis not present

## 2018-02-07 DIAGNOSIS — M549 Dorsalgia, unspecified: Secondary | ICD-10-CM | POA: Diagnosis not present

## 2018-02-23 DIAGNOSIS — M5440 Lumbago with sciatica, unspecified side: Secondary | ICD-10-CM | POA: Diagnosis not present

## 2018-02-23 DIAGNOSIS — M5416 Radiculopathy, lumbar region: Secondary | ICD-10-CM | POA: Diagnosis not present

## 2018-03-12 ENCOUNTER — Ambulatory Visit
Admission: RE | Admit: 2018-03-12 | Discharge: 2018-03-12 | Disposition: A | Discharge status: Home / Self Care | Payer: Medicare Other | Source: Ambulatory Visit | Attending: Internal Medicine | Admitting: Internal Medicine

## 2018-03-12 DIAGNOSIS — Z1231 Encounter for screening mammogram for malignant neoplasm of breast: Secondary | ICD-10-CM

## 2018-03-16 DIAGNOSIS — Z23 Encounter for immunization: Secondary | ICD-10-CM | POA: Diagnosis not present

## 2018-03-30 ENCOUNTER — Other Ambulatory Visit: Payer: Self-pay | Admitting: Cardiology

## 2018-03-30 MED ORDER — SPIRONOLACTONE 25 MG PO TABS: 12.5000 mg | ORAL_TABLET | Freq: Every day | ORAL | 1 refills | Status: DC

## 2018-03-30 MED ORDER — CARVEDILOL 6.25 MG PO TABS: 6.2500 mg | ORAL_TABLET | Freq: Two times a day (BID) | ORAL | 1 refills | Status: DC

## 2018-03-30 NOTE — Telephone Encounter (Signed)
° °  1. Which medications need to be refilled? (please list name of each medication and dose if known) Coreg 6.25 mg & Aldactone 25 mg   2. Which pharmacy/location (including street and city if local pharmacy) is medication to be sent to?  [place]   3. Do they need a 30 day or 90 day supply?

## 2018-04-06 ENCOUNTER — Other Ambulatory Visit: Payer: Self-pay | Admitting: Cardiology

## 2018-04-06 MED ORDER — ENALAPRIL MALEATE 5 MG PO TABS: 2.5000 mg | ORAL_TABLET | Freq: Two times a day (BID) | ORAL | 2 refills | Status: DC

## 2018-04-06 MED ORDER — SPIRONOLACTONE 25 MG PO TABS: 12.5000 mg | ORAL_TABLET | Freq: Every day | ORAL | 2 refills | Status: DC

## 2018-04-06 MED ORDER — ISOSORBIDE MONONITRATE ER 30 MG PO TB24: 30.0000 mg | ORAL_TABLET | Freq: Every day | ORAL | 2 refills | Status: AC

## 2018-04-06 NOTE — Telephone Encounter (Signed)
° °  1. Which medications need to be refilled? (please list name of each medication and dose if known)  Vasotec 5 mg & Imdur 30 mg   2. Which pharmacy/location (including street and city if local pharmacy) is medication to be sent to?  [place]   3. Do they need a 30 day or 90 day supply?  Requested 1 year supply

## 2018-04-06 NOTE — Telephone Encounter (Signed)
Patient needs to have spironolactone 25 mg called to [facility] . She is completely out.

## 2018-04-24 DIAGNOSIS — M5414 Radiculopathy, thoracic region: Secondary | ICD-10-CM | POA: Diagnosis not present

## 2018-04-25 DIAGNOSIS — R350 Frequency of micturition: Secondary | ICD-10-CM | POA: Diagnosis not present

## 2018-04-25 DIAGNOSIS — Z7189 Other specified counseling: Secondary | ICD-10-CM | POA: Diagnosis not present

## 2018-04-25 DIAGNOSIS — E7800 Pure hypercholesterolemia, unspecified: Secondary | ICD-10-CM | POA: Diagnosis not present

## 2018-04-25 DIAGNOSIS — Z1339 Encounter for screening examination for other mental health and behavioral disorders: Secondary | ICD-10-CM | POA: Diagnosis not present

## 2018-04-25 DIAGNOSIS — Z1331 Encounter for screening for depression: Secondary | ICD-10-CM | POA: Diagnosis not present

## 2018-04-25 DIAGNOSIS — Z1211 Encounter for screening for malignant neoplasm of colon: Secondary | ICD-10-CM | POA: Diagnosis not present

## 2018-04-25 DIAGNOSIS — Z6831 Body mass index (BMI) 31.0-31.9, adult: Secondary | ICD-10-CM | POA: Diagnosis not present

## 2018-04-25 DIAGNOSIS — I10 Essential (primary) hypertension: Secondary | ICD-10-CM | POA: Diagnosis not present

## 2018-04-25 DIAGNOSIS — I671 Cerebral aneurysm, nonruptured: Secondary | ICD-10-CM | POA: Diagnosis not present

## 2018-04-25 DIAGNOSIS — R5383 Other fatigue: Secondary | ICD-10-CM | POA: Diagnosis not present

## 2018-04-25 DIAGNOSIS — Z0000 Encounter for general adult medical examination without abnormal findings: Secondary | ICD-10-CM | POA: Diagnosis not present

## 2018-04-25 DIAGNOSIS — Z299 Encounter for prophylactic measures, unspecified: Secondary | ICD-10-CM | POA: Diagnosis not present

## 2018-04-30 DIAGNOSIS — Z79899 Other long term (current) drug therapy: Secondary | ICD-10-CM | POA: Diagnosis not present

## 2018-04-30 DIAGNOSIS — R5383 Other fatigue: Secondary | ICD-10-CM | POA: Diagnosis not present

## 2018-04-30 DIAGNOSIS — E559 Vitamin D deficiency, unspecified: Secondary | ICD-10-CM | POA: Diagnosis not present

## 2018-04-30 DIAGNOSIS — E7800 Pure hypercholesterolemia, unspecified: Secondary | ICD-10-CM | POA: Diagnosis not present

## 2018-05-24 DIAGNOSIS — M5416 Radiculopathy, lumbar region: Secondary | ICD-10-CM | POA: Diagnosis not present

## 2018-05-24 DIAGNOSIS — Z6831 Body mass index (BMI) 31.0-31.9, adult: Secondary | ICD-10-CM | POA: Diagnosis not present

## 2018-05-24 DIAGNOSIS — M5414 Radiculopathy, thoracic region: Secondary | ICD-10-CM | POA: Diagnosis not present

## 2018-05-25 DIAGNOSIS — Z6831 Body mass index (BMI) 31.0-31.9, adult: Secondary | ICD-10-CM | POA: Diagnosis not present

## 2018-05-25 DIAGNOSIS — Z299 Encounter for prophylactic measures, unspecified: Secondary | ICD-10-CM | POA: Diagnosis not present

## 2018-05-25 DIAGNOSIS — I10 Essential (primary) hypertension: Secondary | ICD-10-CM | POA: Diagnosis not present

## 2018-05-25 DIAGNOSIS — R6889 Other general symptoms and signs: Secondary | ICD-10-CM | POA: Diagnosis not present

## 2018-05-25 DIAGNOSIS — J029 Acute pharyngitis, unspecified: Secondary | ICD-10-CM | POA: Diagnosis not present

## 2018-05-25 DIAGNOSIS — E1165 Type 2 diabetes mellitus with hyperglycemia: Secondary | ICD-10-CM | POA: Diagnosis not present

## 2018-05-25 DIAGNOSIS — Z87891 Personal history of nicotine dependence: Secondary | ICD-10-CM | POA: Diagnosis not present

## 2018-05-31 DIAGNOSIS — I671 Cerebral aneurysm, nonruptured: Secondary | ICD-10-CM | POA: Diagnosis not present

## 2018-06-05 ENCOUNTER — Other Ambulatory Visit (HOSPITAL_COMMUNITY): Payer: Self-pay | Admitting: Neurosurgery

## 2018-06-05 DIAGNOSIS — I671 Cerebral aneurysm, nonruptured: Secondary | ICD-10-CM

## 2018-06-06 ENCOUNTER — Telehealth: Payer: Self-pay | Admitting: Cardiology

## 2018-06-06 MED ORDER — ATORVASTATIN CALCIUM 80 MG PO TABS: 80.0000 mg | ORAL_TABLET | Freq: Every day | ORAL | 3 refills | Status: DC

## 2018-06-06 NOTE — Telephone Encounter (Signed)
Lipitor filled for 90 day + 3 RF now - sent to [facility].    Patient was due for 6 mo f/u this January, but did not see anything available with [place] or extenders in [place] office.  Stated that she is having brain procedure (interventional radiology) on 06/22/18.  Stated that she would like to be seen prior to that if possible.  Please advise.

## 2018-06-06 NOTE — Telephone Encounter (Signed)
° ° ° °  1. Which medications need to be refilled? (please list name of each medication and dose if known)    atorvastatin (LIPITOR) 80 MG tablet    2. Which pharmacy/location (including street and city if local pharmacy) is medication to be sent to    [place]   3. Do they need a 30 day or 90 day supply? Patient is requesting 1 year refill

## 2018-06-06 NOTE — Telephone Encounter (Signed)
Patient notified and verbalized understanding. 

## 2018-06-06 NOTE — Telephone Encounter (Signed)
Can she see me Friday at [place]

## 2018-06-08 ENCOUNTER — Encounter: Payer: Self-pay | Admitting: *Deleted

## 2018-06-08 ENCOUNTER — Encounter: Payer: Self-pay | Admitting: Cardiology

## 2018-06-08 ENCOUNTER — Ambulatory Visit: Payer: Medicare Other | Admitting: Cardiology

## 2018-06-08 ENCOUNTER — Ambulatory Visit (INDEPENDENT_AMBULATORY_CARE_PROVIDER_SITE_OTHER): Payer: Medicare Other | Admitting: Cardiology

## 2018-06-08 VITALS — BP 116/66 | HR 96 | Ht 64.0 in | Wt 180.0 lb

## 2018-06-08 DIAGNOSIS — I10 Essential (primary) hypertension: Secondary | ICD-10-CM | POA: Diagnosis not present

## 2018-06-08 DIAGNOSIS — I2510 Atherosclerotic heart disease of native coronary artery without angina pectoris: Secondary | ICD-10-CM

## 2018-06-08 DIAGNOSIS — E782 Mixed hyperlipidemia: Secondary | ICD-10-CM

## 2018-06-08 NOTE — Progress Notes (Signed)
Clinical Summary Casey Avila is a 73 y.o.female seen today for follow up, this is a focal visit on her recent symptoms of chest pain and recent caht.   1. CAD/ICM - admitted with anterior STEMI 05/02/14, s/p DES to LAD. LVEF 40% by echo at that time. Repeat echo 07/2014 shows LVEF 55-60%.   10/2016 nuclear stress: prior anterior/anteroseptal scar, mild peri-infarct ischemia. Mild apical ischemia. Low risk study   - 10/2017 cath: as reported below. Patent vessels and stent. LVEDP 22.  - no exertional symptoms since last visit.     2. HTN - compliant with meds  3. Hyperlipidemia - recent labs with labs.  - she is compliant with statin  4. Cerebral aneurysm - plans for IR procedure end of month Past Medical History:  Diagnosis Date  . Aneurysm ([place])    brain aneurysms x 2 x several yrs  . Coronary artery disease   . DDD (degenerative disc disease), cervical   . Diabetes mellitus without complication ([place])   . GERD (gastroesophageal reflux disease)   . HTN (hypertension)   . Hyperlipidemia   . Ischemic cardiomyopathy 05/02/2014  . Osteoarthritis   . Osteoporosis   . ST elevation (STEMI) myocardial infarction involving left anterior descending coronary artery ([place]) 05/02/2014  . Ulcerative colitis ([place])      Allergies  Allergen Reactions  . Clindamycin/Lincomycin Itching and Rash    Had to go into ER   . Bactrim [Sulfamethoxazole-Trimethoprim] Rash  . Morphine And Related     Makes me sick      Current Outpatient Medications  Medication Sig Dispense Refill  . aspirin 81 MG chewable tablet Chew 1 tablet (81 mg total) by mouth daily.    Marland Kitchen atorvastatin (LIPITOR) 80 MG tablet Take 1 tablet (80 mg total) by mouth daily. 90 tablet 3  . baclofen (LIORESAL) 10 MG tablet Take 10 mg by mouth 2 (two) times daily.    . carvedilol (COREG) 6.25 MG tablet Take 1 tablet (6.25 mg total) by mouth 2 (two) times daily with a meal. 180 tablet 1  . Cholecalciferol (VITAMIN D)  2000 units CAPS Take 1 capsule by mouth daily.     . Cyanocobalamin 1000 MCG/ML KIT Inject 1 mL as directed every 30 (thirty) days.     . enalapril (VASOTEC) 5 MG tablet Take 0.5 tablets (2.5 mg total) by mouth 2 (two) times daily. 90 tablet 2  . esomeprazole (NEXIUM) 40 MG capsule Take 40 mg by mouth daily.     . furosemide (LASIX) 40 MG tablet TAKE 1 TAB DAILY AS NEEDED (Patient taking differently: Take 40 mg by mouth daily as needed for fluid. TAKE 1 TAB DAILY AS NEEDED) 90 tablet 1  . HYDROcodone-acetaminophen (NORCO) 7.5-325 MG tablet Take 1 tablet by mouth 2 (two) times daily.  0  . isosorbide mononitrate (IMDUR) 30 MG 24 hr tablet Take 1 tablet (30 mg total) by mouth daily. 90 tablet 2  . lidocaine (LIDODERM) 5 % Place 3 patches onto the skin daily as needed (PAIN). Remove & Discard patch within 12 hours or as directed by MD     . metFORMIN (GLUCOPHAGE-XR) 500 MG 24 hr tablet Take 250 mg by mouth 2 (two) times daily.  1  . Multiple Vitamins-Minerals (PRESERVISION AREDS 2 PO) Take 1 capsule by mouth 2 (two) times daily.    . nitroGLYCERIN (NITROSTAT) 0.4 MG SL tablet Place 1 tablet (0.4 mg total) under the tongue every 5 (five) minutes as  needed for chest pain. 25 tablet 3  . potassium chloride SA (K-DUR,KLOR-CON) 20 MEQ tablet Take 1 tablet (20 mEq total) by mouth as directed. On the days that you take your as needed Lasix 90 tablet 1  . spironolactone (ALDACTONE) 25 MG tablet Take 0.5 tablets (12.5 mg total) by mouth daily. 45 tablet 2  . topiramate (TOPAMAX) 100 MG tablet Take 100 mg by mouth 2 (two) times daily.     . traZODone (DESYREL) 50 MG tablet Take 50 mg by mouth at bedtime.    Marland Kitchen venlafaxine (EFFEXOR) 75 MG tablet Take 75 mg by mouth at bedtime.      No current facility-administered medications for this visit.      Past Surgical History:  Procedure Laterality Date  . ABDOMINAL HYSTERECTOMY    . BLADDER SUSPENSION    . CARDIAC CATHETERIZATION    . LEFT HEART CATH AND  CORONARY ANGIOGRAPHY N/A 11/14/2017   Procedure: LEFT HEART CATH AND CORONARY ANGIOGRAPHY;  Surgeon: Belva Crome, MD;  Location: [facility];  Service: Cardiovascular;  Laterality: N/A;  . LEFT HEART CATHETERIZATION WITH CORONARY ANGIOGRAM N/A 05/02/2014   Procedure: LEFT HEART CATHETERIZATION WITH CORONARY ANGIOGRAM;  Surgeon: Jettie Booze, MD;  Location: [facility];  Service: Cardiovascular;  Laterality: N/A;  . NECK SURGERY     x 2  . PTCA       Allergies  Allergen Reactions  . Clindamycin/Lincomycin Itching and Rash    Had to go into ER   . Bactrim [Sulfamethoxazole-Trimethoprim] Rash  . Morphine And Related     Makes me sick       Family History  Problem Relation Age of Onset  . Anuerysm Mother   . Alcoholism Father   . Anuerysm Other   . Colon cancer Son      Social History Casey Avila reports that she quit smoking about 4 years ago. Her smoking use included cigarettes. She started smoking about 52 years ago. She has a 72.00 pack-year smoking history. She has never used smokeless tobacco. Casey Avila reports no history of alcohol use.   Review of Systems CONSTITUTIONAL: No weight loss, fever, chills, weakness or fatigue.  HEENT: Eyes: No visual loss, blurred vision, double vision or yellow sclerae.No hearing loss, sneezing, congestion, runny nose or sore throat.  SKIN: No rash or itching.  CARDIOVASCULAR: per hpi RESPIRATORY: No shortness of breath, cough or sputum.  GASTROINTESTINAL: No anorexia, nausea, vomiting or diarrhea. No abdominal pain or blood.  GENITOURINARY: No burning on urination, no polyuria NEUROLOGICAL: No headache, dizziness, syncope, paralysis, ataxia, numbness or tingling in the extremities. No change in bowel or bladder control.  MUSCULOSKELETAL: No muscle, back pain, joint pain or stiffness.  LYMPHATICS: No enlarged nodes. No history of splenectomy.  PSYCHIATRIC: No history of depression or anxiety.  ENDOCRINOLOGIC: No reports  of sweating, cold or heat intolerance. No polyuria or polydipsia.  Marland Kitchen   Physical Examination Vitals:   06/08/18 0942  BP: 116/66  Pulse: 96  SpO2: 93%   Vitals:   06/08/18 0942  Weight: 180 lb (81.6 kg)  Height: _0  (1.626 m)    Gen: resting comfortably, no acute distress HEENT: no scleral icterus, pupils equal round and reactive, no palptable cervical adenopathy,  CV: RRR, no m/r/g, no jvd Resp: Clear to auscultation bilaterally GI: abdomen is soft, non-tender, non-distended, normal bowel sounds, no hepatosplenomegaly MSK: extremities are warm, no edema.  Skin: warm, no rash Neuro:  no focal deficits  Psych: appropriate affect   Diagnostic Studies 04/2014 Echo Study Conclusions  - Left ventricle: Can not rule out apical clot. Severe hypokinesis mid/apical inferior segments, mid/apical anterior segments, mid anteroseptal segment. The cavity size was normal. Wall thickness was increased in a pattern of mild LVH. The estimated ejection fraction was 40%. Doppler parameters are consistent with abnormal left ventricular relaxation (grade 1 diastolic dysfunction). - Right ventricle: The cavity size was normal. Systolic function was normal.  04/2014 Cath  HEMODYNAMICS: Aortic pressure was 132/67; LV pressure was 136/19; LVEDP 34. There was no gradient between the left ventricle and aorta.   ANGIOGRAPHIC DATA: The left main coronary artery is widely patent.  The left anterior descending artery is a large vessel. The vessel is occluded in the midportion. After intervention, it was noted that there was disease just past the occlusion. The remainder of the mid to distal LAD was widely patent. There were several small diagonals which were patent.  The left circumflex artery is a large vessel with mild irregularities proximally. There is a large first obtuse marginal which is widely patent. The second obtuse marginal is large and widely patent.  The right  coronary artery is a large dominant vessel. In the mid vessel, there is an eccentric 40% stenosis. The posterior descending artery is large and widely patent. The posterior lateral artery is large and widely patent.  LEFT VENTRICULOGRAM: Left ventricular angiogram was not done due to elevated LVEDP. LVEDP was 34 mmHg.  PCI NARRATIVE: A CLS 3.0 guiding catheter was used to engage the left main. A pro-water wire was placed across the area disease in the LAD. A fetch thrombectomy catheter was used to perform aspiration thrombectomy. There was significant thrombus removal. A second pass was performed with even more thrombus removal. TIMI-3 flow was restored after the first pass. There was a residual mid vessel lesion which was about 12 mm in length, up to 80%. Just past this, there was another segment of moderate disease. The more proximal area was ballooned with a 2.5 x 12 balloon. The entire area was stented with a 2.75 x 28 Promus drug-eluting stent, postdilated to greater than 3 mm in diameter. There was some sluggish flow in the very apical LAD. Multiple doses of IC adenosine were given. TIMI-3 flow was present at the end of the procedure. The patient was pain-free.  IMPRESSIONS:  1. Normal left main coronary artery. 2. Occluded mid left anterior descending artery which was the culprit for today's presentation. This was successfully treated with a 2.75 x 28 Promus drug-eluting stent, postdilated to greater than 3 mm in diameter. 3. Mild disease in the left circumflex artery and its branches. 4. Mild to moderate disease in the right coronary artery. 5. Left ventricular systolic function was not assessed. LVEDP 34 mmHg.  RECOMMENDATION: We'll watch her in the ICU. Start low-dose beta blocker. Start high-dose statin. She needs aggressive secondary prevention. Of note, during the case, we checked about the safety of dual antiplatelet therapy with Dr. Ellene Route, as the patient has small brain  aneurysms per her report. She was thought to be appropriate for prolonged dual antiplatelet therapy. She will need dual antiplatelet therapy for at least a year. Check echocardiogram to evaluate LV function.    07/2014 Echo Study Conclusions  - Left ventricle: The cavity size was normal. Wall thickness was normal. Systolic function was normal. The estimated ejection fraction was in the range of 55% to 60%. There is mild hypokinesis of the mid-apicalanteroseptal myocardium. Doppler  parameters are consistent with abnormal left ventricular relaxation (grade 1 diastolic dysfunction). Doppler parameters are consistent with elevated ventricular end-diastolic filling pressure. - Aortic valve: Mildly calcified annulus. Trileaflet. - Left atrium: The atrium was mildly dilated. - Right atrium: Central venous pressure (est): 3 mm Hg. - Atrial septum: There was increased thickness of the septum, consistent with lipomatous hypertrophy. - Pulmonary arteries: Systolic pressure could not be accurately estimated. - Pericardium, extracardiac: There was no pericardial effusion.  Impressions:  - Normal LV wall thickness with LVEF 55-60%, mild mid to apical anteroseptal hypokinesis, and grade 1 diastolic dysfunction. There has been improvement in LVEF compared to the prior study from December 2015. Mild left atrial enlargement. Unable to assess PASP.   11/2015 Nuclear Stress  There was no ST segment deviation noted during stress.  The study is normal. There are no perfusion defects consistent with prior infarction or current ischemia.  This is a low risk study.  The left ventricular ejection fraction is normal (55-65%).   10/2016 Nuclear stress  Blood pressure demonstrated a normal response to exercise.  There was no ST segment deviation noted during stress.  Findings consistent with prior anterior/anteroseptal myocardial infarction with mild peri-infarct  ischemia. There is mild apical ischemia  This is a low risk study. Overall small amount of myocardium currently at jeopardy.  The left ventricular ejection fraction is normal (55-65%).   11/14/17 cath  Normal left main  Luminal irregularities proximal LAD, widely patent stent within the mid LAD. No significant obstruction is noted in the LAD territory.  Luminal irregularity up to 20% in the proximal circumflex. 2 large obtuse marginal branches are widely patent.  Dominant right coronary with luminal irregularities in the mid vessel up to 30 to 40%.  Normal left ventricular systolic function with mildly elevated LVEDP of 22 mmHg consistent with diastolic heart failure. EF 45 to 50% with apical severe hypokinesis.  Post cath RECOMMENDATIONS:   Continue aggressive risk factor modification  Consider alternative explanations for the patient's exertional shoulder discomfort and dyspnea.    Assessment and Plan  1. CAD/ICMwith other forms of angina -no significant symptoms recent cath without significant disease - continue current meds   2. HTN - bp is at goal, continue current meds   3. Hyperlipidemia - request labs from her pcp, continue statin    Arnoldo Lenis, M.D.

## 2018-06-08 NOTE — Patient Instructions (Signed)
Medication Instructions:  Your physician recommends that you continue on your current medications as directed. Please refer to the Current Medication list given to you today.  If you need a refill on your cardiac medications before your next appointment, please call your pharmacy.   Lab work: NONE   If you have labs (blood work) drawn today and your tests are completely normal, you will receive your results only by: . MyChart Message (if you have MyChart) OR . A paper copy in the mail If you have any lab test that is abnormal or we need to change your treatment, we will call you to review the results.  Testing/Procedures: NONE   Follow-Up: At [facility], you and your health needs are our priority.  As part of our continuing mission to provide you with exceptional heart care, we have created designated Provider Care Teams.  These Care Teams include your primary Cardiologist (physician) and Advanced Practice Providers (APPs -  Physician Assistants and Nurse Practitioners) who all work together to provide you with the care you need, when you need it. You will need a follow up appointment in 6 months.  Please call our office 2 months in advance to schedule this appointment.  You may see Branch, Jonathan, MD or one of the following Advanced Practice Providers on your designated Care Team:   Brittany Strader, PA-C ([place] Office) . Michele Lenze, PA-C ([place] Office)  Any Other Special Instructions Will Be Listed Below (If Applicable). Thank you for choosing [facility]!     

## 2018-06-11 ENCOUNTER — Other Ambulatory Visit: Payer: Self-pay | Admitting: Neurosurgery

## 2018-06-21 NOTE — H&P (Signed)
Chief Complaint   aneurysm   HPI   HPI: EMAAN Avila is a 73 y.o. female who was found to have a right MCA aneurysm as well as a questionable left PCOM aneurysm during a screening MRA of the brain secondary to strong familial history of ruptured aneurysms (mother and grandmother for are deceased secondary to ruptured aneurysms at age 24 and 98 respectively).  She also has a significant smoking history.  She presents today for diagnostic angiogram.  She is without any concerns.  Patient Active Problem List   Diagnosis Date Noted  . Other chest pain   . CAD in native artery   . Sinus tachycardia 05/05/2014  . Cardiomyopathy, ischemic - status post anterior STEMI with EF 40% 05/05/2014  . Acute combined systolic and diastolic heart failure (Casey Avila) 05/05/2014  . Hyperlipidemia with target LDL less than 70 05/05/2014  . Current smoker 05/05/2014  . Essential hypertension   . ST elevation (STEMI) myocardial infarction involving left anterior descending coronary artery (Casey Avila)   . GERD 07/21/2008  . DIVERTICULOSIS-COLON 07/21/2008  . FLATULENCE-GAS-BLOATING 07/21/2008  . DIARRHEA 07/21/2008  . ABDOMINAL PAIN-GENERALIZED 07/21/2008  . ABDOMINAL PAIN-MULTIPLE SITES 07/21/2008  . PERSONAL HX COLONIC POLYPS 07/21/2008    PMH: Past Medical History:  Diagnosis Date  . Aneurysm ([place])    brain aneurysms x 2 x several yrs  . Coronary artery disease   . DDD (degenerative disc disease), cervical   . Diabetes mellitus without complication ([place])   . GERD (gastroesophageal reflux disease)   . HTN (hypertension)   . Hyperlipidemia   . Ischemic cardiomyopathy 05/02/2014  . Osteoarthritis   . Osteoporosis   . ST elevation (STEMI) myocardial infarction involving left anterior descending coronary artery (Casey Avila) 05/02/2014  . Ulcerative colitis (Casey Avila)     PSH: Past Surgical History:  Procedure Laterality Date  . ABDOMINAL HYSTERECTOMY    . BLADDER SUSPENSION    . CARDIAC CATHETERIZATION      . LEFT HEART CATH AND CORONARY ANGIOGRAPHY N/A 11/14/2017   Procedure: LEFT HEART CATH AND CORONARY ANGIOGRAPHY;  Surgeon: Casey Crome, MD;  Location: [place] [facility];  Service: Cardiovascular;  Laterality: N/A;  . LEFT HEART CATHETERIZATION WITH CORONARY ANGIOGRAM N/A 05/02/2014   Procedure: LEFT HEART CATHETERIZATION WITH CORONARY ANGIOGRAM;  Surgeon: Casey Booze, MD;  Location: [facility] CATH LAB;  Service: Cardiovascular;  Laterality: N/A;  . NECK SURGERY     x 2  . PTCA      (Not in a hospital admission)   SH: Social History   Tobacco Use  . Smoking status: Former Smoker    Packs/day: 1.50    Years: 48.00    Pack years: 72.00    Types: Cigarettes    Start date: 05/09/1966    Last attempt to quit: 05/01/2014    Years since quitting: 4.1  . Smokeless tobacco: Never Used  Substance Use Topics  . Alcohol use: No    Alcohol/week: 0.0 standard drinks  . Drug use: No    MEDS: Prior to Admission medications   Medication Sig Start Date End Date Taking? Authorizing Provider  aspirin 81 MG chewable tablet Chew 1 tablet (81 mg total) by mouth daily. 05/05/14  Yes Casey Avila, Casey Croon, PA-C  atorvastatin (LIPITOR) 80 MG tablet Take 1 tablet (80 mg total) by mouth daily. 06/06/18  Yes Casey Avila, Casey Guild, MD  baclofen (LIORESAL) 10 MG tablet Take 10 mg by mouth 2 (two) times daily.   Yes [provider]  carvedilol (COREG) 6.25 MG tablet Take 1 tablet (6.25 mg total) by mouth 2 (two) times daily with a meal. 03/30/18  Yes Casey Avila, Casey Guild, MD  Cholecalciferol (VITAMIN D) 2000 units CAPS Take 1 capsule by mouth daily.    Yes [provider]  Cyanocobalamin 1000 MCG/ML KIT Inject 1 mL as directed every 30 (thirty) days.    Yes [provider]  enalapril (VASOTEC) 5 MG tablet Take 0.5 tablets (2.5 mg total) by mouth 2 (two) times daily. 04/06/18  Yes BranchAlphonse Guild, MD  esomeprazole (NEXIUM) 40 MG capsule Take 40 mg by mouth daily.    Yes [provider]  furosemide (LASIX) 40 MG tablet TAKE 1 TAB DAILY AS NEEDED Patient taking differently: Take 40 mg by mouth daily as needed for fluid. TAKE 1 TAB DAILY AS NEEDED 08/02/17  Yes Casey Avila, Casey Guild, MD  HYDROcodone-acetaminophen (NORCO) 7.5-325 MG tablet Take 2 tablets by mouth 2 (two) times daily.  04/08/16  Yes [provider]  isosorbide mononitrate (IMDUR) 30 MG 24 hr tablet Take 1 tablet (30 mg total) by mouth daily. 04/06/18  Yes Casey Avila, Casey Guild, MD  lidocaine (LIDODERM) 5 % Place 3 patches onto the skin daily as needed (PAIN). Remove & Discard patch within 12 hours or as directed by MD    Yes [provider]  metFORMIN (GLUCOPHAGE-XR) 500 MG 24 hr tablet Take 250 mg by mouth 2 (two) times daily. 10/25/17  Yes [provider]  Multiple Vitamins-Minerals (PRESERVISION AREDS 2 PO) Take 1 capsule by mouth 2 (two) times daily.   Yes [provider]  nitroGLYCERIN (NITROSTAT) 0.4 MG SL tablet Place 1 tablet (0.4 mg total) under the tongue every 5 (five) minutes as needed for chest pain. 08/02/17  Yes Casey Avila, Casey Guild, MD  potassium chloride SA (K-DUR,KLOR-CON) 20 MEQ tablet Take 1 tablet (20 mEq total) by mouth as directed. On the days that you take your as needed Lasix 08/02/17  Yes Casey Avila, Casey Guild, MD  spironolactone (ALDACTONE) 25 MG tablet Take 0.5 tablets (12.5 mg total) by mouth daily. 04/06/18 07/05/18 Yes BranchAlphonse Guild, MD  topiramate (TOPAMAX) 100 MG tablet Take 100 mg by mouth 2 (two) times daily.    Yes [provider]  traZODone (DESYREL) 50 MG tablet Take 50 mg by mouth at bedtime.   Yes [provider]  venlafaxine (EFFEXOR) 75 MG tablet Take 75 mg by mouth at bedtime.    Yes [provider]    ALLERGY: Allergies  Allergen Reactions  . Clindamycin/Lincomycin Itching and Rash    Had to go into ER   . Bactrim [Sulfamethoxazole-Trimethoprim] Rash  . Morphine And Related     Makes me sick     Social  History   Tobacco Use  . Smoking status: Former Smoker    Packs/day: 1.50    Years: 48.00    Pack years: 72.00    Types: Cigarettes    Start date: 05/09/1966    Last attempt to quit: 05/01/2014    Years since quitting: 4.1  . Smokeless tobacco: Never Used  Substance Use Topics  . Alcohol use: No    Alcohol/week: 0.0 standard drinks     Family History  Problem Relation Age of Onset  . Anuerysm Mother   . Alcoholism Father   . Anuerysm Other   . Colon cancer Son      ROS   ROS  Exam   There were no vitals filed  for this visit. General appearance: WDWN, NAD Eyes: No scleral injection Cardiovascular: Regular rate and rhythm without murmurs, rubs, gallops. No edema or variciosities. Distal pulses normal. Pulmonary: Effort normal, non-labored breathing Musculoskeletal:     Muscle tone upper extremities: Normal    Muscle tone lower extremities: Normal    Motor exam: Upper Extremities Deltoid Bicep Tricep Grip  Right 5/5 5/5 5/5 5/5  Left 5/5 5/5 5/5 5/5   Lower Extremity IP Quad PF DF EHL  Right 5/5 5/5 5/5 5/5 5/5  Left 5/5 5/5 5/5 5/5 5/5   Neurological Mental Status:    - Patient is awake, alert, oriented to person, place, month, year, and situation    - Patient is able to give a clear and coherent history.    - No signs of aphasia or neglect Cranial Nerves    - II: Visual Fields are full. PERRL    - III/IV/VI: EOMI without ptosis or diploplia.     - V: Facial sensation is grossly normal    - VII: Facial movement is symmetric.     - VIII: hearing is intact to voice    - X: Uvula elevates symmetrically    - XI: Shoulder shrug is symmetric.    - XII: tongue is midline without atrophy or fasciculations.  Sensory: Sensation grossly intact to LT  Results - Imaging/Labs   No results found for this or any previous visit (from the past 48 hour(s)).  No results found.  IMAGING: For most recent CT angiogram of the head was reviewed personally.  This does  demonstrate what appears to be an inferiorly projecting right middle cerebral artery bifurcation aneurysm measuring approximately 4 mm.  I did also review previous diagnostic angiogram of 2004.  At that time, there was a small approximately 3-4 mm left posterior communicating artery aneurysm.  At that time, I do not see a right-sided middle cerebral artery aneurysm.  In further review of the CT angiogram, there may be a small inferiorly and medially projecting left PCOM aneurysm just anterior to the left posterior clinoid process.  Impression/Plan   73 y.o. female with multiple vascular risk factors including history of smoking, strong family history of intracranial aneurysm, and development of a right middle cerebral artery aneurysm and questionable left posterior communicating artery aneurysm.   She presents today for trans radial diagnostic cerebral angiogram for further characterization.  While in the office the risks of the angiogram procedure were reviewed, including a 0.1% risk of stroke, and overall risk of approximately 1% including but not limited to groin hematoma, headache, contrast reaction, and nephropathy.  The patient and her husband understood our discussion and are willing to proceed as above.

## 2018-06-22 ENCOUNTER — Other Ambulatory Visit (HOSPITAL_COMMUNITY): Payer: Self-pay | Admitting: Neurosurgery

## 2018-06-22 ENCOUNTER — Encounter (HOSPITAL_COMMUNITY): Payer: Self-pay | Admitting: Neurosurgery

## 2018-06-22 ENCOUNTER — Ambulatory Visit (HOSPITAL_COMMUNITY)
Admission: RE | Admit: 2018-06-22 | Discharge: 2018-06-22 | Disposition: A | Discharge status: Home / Self Care | Payer: Medicare Other | Source: Ambulatory Visit | Attending: Neurosurgery | Admitting: Neurosurgery

## 2018-06-22 DIAGNOSIS — K219 Gastro-esophageal reflux disease without esophagitis: Secondary | ICD-10-CM | POA: Diagnosis not present

## 2018-06-22 DIAGNOSIS — Z7982 Long term (current) use of aspirin: Secondary | ICD-10-CM | POA: Diagnosis not present

## 2018-06-22 DIAGNOSIS — Z885 Allergy status to narcotic agent status: Secondary | ICD-10-CM | POA: Insufficient documentation

## 2018-06-22 DIAGNOSIS — M1990 Unspecified osteoarthritis, unspecified site: Secondary | ICD-10-CM | POA: Insufficient documentation

## 2018-06-22 DIAGNOSIS — I671 Cerebral aneurysm, nonruptured: Secondary | ICD-10-CM | POA: Diagnosis not present

## 2018-06-22 DIAGNOSIS — Z79899 Other long term (current) drug therapy: Secondary | ICD-10-CM | POA: Diagnosis not present

## 2018-06-22 DIAGNOSIS — I255 Ischemic cardiomyopathy: Secondary | ICD-10-CM | POA: Insufficient documentation

## 2018-06-22 DIAGNOSIS — I5042 Chronic combined systolic (congestive) and diastolic (congestive) heart failure: Secondary | ICD-10-CM | POA: Diagnosis not present

## 2018-06-22 DIAGNOSIS — I252 Old myocardial infarction: Secondary | ICD-10-CM | POA: Insufficient documentation

## 2018-06-22 DIAGNOSIS — Z7984 Long term (current) use of oral hypoglycemic drugs: Secondary | ICD-10-CM | POA: Insufficient documentation

## 2018-06-22 DIAGNOSIS — Z881 Allergy status to other antibiotic agents status: Secondary | ICD-10-CM | POA: Insufficient documentation

## 2018-06-22 DIAGNOSIS — I110 Hypertensive heart disease with heart failure: Secondary | ICD-10-CM | POA: Diagnosis not present

## 2018-06-22 DIAGNOSIS — I2510 Atherosclerotic heart disease of native coronary artery without angina pectoris: Secondary | ICD-10-CM | POA: Diagnosis not present

## 2018-06-22 DIAGNOSIS — E119 Type 2 diabetes mellitus without complications: Secondary | ICD-10-CM | POA: Insufficient documentation

## 2018-06-22 DIAGNOSIS — Z87891 Personal history of nicotine dependence: Secondary | ICD-10-CM | POA: Diagnosis not present

## 2018-06-22 DIAGNOSIS — E785 Hyperlipidemia, unspecified: Secondary | ICD-10-CM | POA: Diagnosis not present

## 2018-06-22 DIAGNOSIS — R9089 Other abnormal findings on diagnostic imaging of central nervous system: Secondary | ICD-10-CM | POA: Diagnosis not present

## 2018-06-22 HISTORY — PX: IR ANGIO INTRA EXTRACRAN SEL INTERNAL CAROTID BILAT MOD SED: IMG5363

## 2018-06-22 HISTORY — PX: IR ANGIO VERTEBRAL SEL VERTEBRAL UNI R MOD SED: IMG5368

## 2018-06-22 HISTORY — PX: IR US GUIDE VASC ACCESS RIGHT: IMG2390

## 2018-06-22 LAB — PROTIME-INR
INR: 0.93
Prothrombin Time: 12.4 seconds (ref 11.4–15.2)

## 2018-06-22 LAB — CBC WITH DIFFERENTIAL/PLATELET
Abs Immature Granulocytes: 0.05 10*3/uL (ref 0.00–0.07)
Basophils Absolute: 0.1 10*3/uL (ref 0.0–0.1)
Basophils Relative: 1 %
Eosinophils Absolute: 0.4 10*3/uL (ref 0.0–0.5)
Eosinophils Relative: 4 %
HCT: 38.7 % (ref 36.0–46.0)
Hemoglobin: 12.7 g/dL (ref 12.0–15.0)
Immature Granulocytes: 1 %
Lymphocytes Relative: 33 %
Lymphs Abs: 3.3 10*3/uL (ref 0.7–4.0)
MCH: 30 pg (ref 26.0–34.0)
MCHC: 32.8 g/dL (ref 30.0–36.0)
MCV: 91.5 fL (ref 80.0–100.0)
MONOS PCT: 10 %
Monocytes Absolute: 1 10*3/uL (ref 0.1–1.0)
Neutro Abs: 5.1 10*3/uL (ref 1.7–7.7)
Neutrophils Relative %: 51 %
Platelets: 209 10*3/uL (ref 150–400)
RBC: 4.23 MIL/uL (ref 3.87–5.11)
RDW: 13.8 % (ref 11.5–15.5)
WBC: 9.9 10*3/uL (ref 4.0–10.5)
nRBC: 0 % (ref 0.0–0.2)

## 2018-06-22 LAB — BASIC METABOLIC PANEL
ANION GAP: 9 (ref 5–15)
BUN: 12 mg/dL (ref 8–23)
CO2: 26 mmol/L (ref 22–32)
Calcium: 9.4 mg/dL (ref 8.9–10.3)
Chloride: 108 mmol/L (ref 98–111)
Creatinine, Ser: 0.8 mg/dL (ref 0.44–1.00)
GFR calc Af Amer: >60 mL/min (ref >60)
GFR calc non Af Amer: >60 mL/min (ref >60)
Glucose, Bld: 127 mg/dL — ABNORMAL HIGH (ref 70–99)
Potassium: 3.8 mmol/L (ref 3.5–5.1)
Sodium: 143 mmol/L (ref 135–145)

## 2018-06-22 LAB — GLUCOSE, CAPILLARY: Glucose-Capillary: 87 mg/dL (ref 70–99)

## 2018-06-22 LAB — APTT: APTT: 25 s (ref 24–36)

## 2018-06-22 MED ORDER — NITROGLYCERIN 1 MG/10 ML FOR IR/CATH LAB
INTRA_ARTERIAL | Status: AC
  Filled 2018-06-22: qty 10

## 2018-06-22 MED ORDER — FENTANYL CITRATE (PF) 100 MCG/2ML IJ SOLN
INTRAMUSCULAR | Status: AC
  Filled 2018-06-22: qty 2

## 2018-06-22 MED ORDER — LIDOCAINE HCL 1 % IJ SOLN
INTRAMUSCULAR | Status: AC | PRN
  Administered 2018-06-22: 5 mL

## 2018-06-22 MED ORDER — MIDAZOLAM HCL 2 MG/2ML IJ SOLN
INTRAMUSCULAR | Status: AC | PRN
  Administered 2018-06-22: 1 mg via INTRAVENOUS

## 2018-06-22 MED ORDER — VERAPAMIL HCL 2.5 MG/ML IV SOLN
INTRAVENOUS | Status: AC | PRN
  Administered 2018-06-22: 2.5 mg via INTRA_ARTERIAL

## 2018-06-22 MED ORDER — MIDAZOLAM HCL 2 MG/2ML IJ SOLN
INTRAMUSCULAR | Status: AC
  Filled 2018-06-22: qty 2

## 2018-06-22 MED ORDER — FENTANYL CITRATE (PF) 100 MCG/2ML IJ SOLN
INTRAMUSCULAR | Status: AC | PRN
  Administered 2018-06-22: 25 ug via INTRAVENOUS

## 2018-06-22 MED ORDER — NITROGLYCERIN 1 MG/10 ML FOR IR/CATH LAB
INTRA_ARTERIAL | Status: AC | PRN
  Administered 2018-06-22: 100 ug via INTRA_ARTERIAL

## 2018-06-22 MED ORDER — HEPARIN SODIUM (PORCINE) 1000 UNIT/ML IJ SOLN
INTRAMUSCULAR | Status: AC | PRN
  Administered 2018-06-22: 3000 [IU] via INTRAVENOUS

## 2018-06-22 MED ORDER — HEPARIN SODIUM (PORCINE) 1000 UNIT/ML IJ SOLN
INTRAMUSCULAR | Status: AC
  Filled 2018-06-22: qty 1

## 2018-06-22 MED ORDER — LIDOCAINE-PRILOCAINE 2.5-2.5 % EX CREA
TOPICAL_CREAM | Freq: Once | CUTANEOUS | Status: DC
  Filled 2018-06-22: qty 5

## 2018-06-22 MED ORDER — VERAPAMIL HCL 2.5 MG/ML IV SOLN
INTRAVENOUS | Status: AC
  Filled 2018-06-22: qty 2

## 2018-06-22 MED ORDER — LIDOCAINE HCL 1 % IJ SOLN
INTRAMUSCULAR | Status: AC
  Filled 2018-06-22: qty 20

## 2018-06-22 MED ORDER — CHLORHEXIDINE GLUCONATE CLOTH 2 % EX PADS: 6.0000 | MEDICATED_PAD | Freq: Once | CUTANEOUS | Status: DC

## 2018-06-22 MED ORDER — IOHEXOL 300 MG/ML  SOLN: 150.0000 mL | Freq: Once | INTRAMUSCULAR | Status: DC | PRN

## 2018-06-22 MED ORDER — CEFAZOLIN SODIUM-DEXTROSE 2-4 GM/100ML-% IV SOLN: 2.0000 g | INTRAVENOUS | Status: DC

## 2018-06-22 NOTE — Discharge Instructions (Signed)
Drink plenty of fluids °Keep right arm at or above heart level  °Radial Site Care ° °This sheet gives you information about how to care for yourself after your procedure. Your health care provider may also give you more specific instructions. If you have problems or questions, contact your health care provider. °What can I expect after the procedure? °After the procedure, it is common to have: °· Bruising and tenderness at the catheter insertion area. °Follow these instructions at home: °Medicines °· Take over-the-counter and prescription medicines only as told by your health care provider. °Insertion site care °· Follow instructions from your health care provider about how to take care of your insertion site. Make sure you: °? Wash your hands with soap and water before you change your bandage (dressing). If soap and water are not available, use hand sanitizer. °? Change your dressing as told by your health care provider. °? Leave stitches (sutures), skin glue, or adhesive strips in place. These skin closures may need to stay in place for 2 weeks or longer. If adhesive strip edges start to loosen and curl up, you may trim the loose edges. Do not remove adhesive strips completely unless your health care provider tells you to do that. °· Check your insertion site every day for signs of infection. Check for: °? Redness, swelling, or pain. °? Fluid or blood. °? Pus or a bad smell. °? Warmth. °· Do not take baths, swim, or use a hot tub until your health care provider approves. °· You may shower 24-48 hours after the procedure, or as directed by your health care provider. °? Remove the dressing and gently wash the site with plain soap and water. °? Pat the area dry with a clean towel. °? Do not rub the site. That could cause bleeding. °· Do not apply powder or lotion to the site. °Activity ° °· For 24 hours after the procedure, or as directed by your health care provider: °? Do not flex or bend the affected arm. °? Do  not push or pull heavy objects with the affected arm. °? Do not drive yourself home from the hospital or clinic. You may drive 24 hours after the procedure unless your health care provider tells you not to. °? Do not operate machinery or power tools. °· Do not lift anything that is heavier than 10 lb (4.5 kg), or the limit that you are told, until your health care provider says that it is safe. °· Ask your health care provider when it is okay to: °? Return to work or school. °? Resume usual physical activities or sports. °? Resume sexual activity. °General instructions °· If the catheter site starts to bleed, raise your arm and put firm pressure on the site. If the bleeding does not stop, get help right away. This is a medical emergency. °· If you went home on the same day as your procedure, a responsible adult should be with you for the first 24 hours after you arrive home. °· Keep all follow-up visits as told by your health care provider. This is important. °Contact a health care provider if: °· You have a fever. °· You have redness, swelling, or yellow drainage around your insertion site. °Get help right away if: °· You have unusual pain at the radial site. °· The catheter insertion area swells very fast. °· The insertion area is bleeding, and the bleeding does not stop when you hold steady pressure on the area. °· Your arm or hand   hand becomes pale, cool, tingly, or numb. °These symptoms may represent a serious problem that is an emergency. Do not wait to see if the symptoms will go away. Get medical help right away. Call your local emergency services (911 in the [place] Do not drive yourself to the hospital. °Summary °· After the procedure, it is common to have bruising and tenderness at the site. °· Follow instructions from your health care provider about how to take care of your radial site wound. Check the wound every day for signs of infection. °· Do not lift anything that is heavier than 10 lb (4.5 kg), or the  limit that you are told, until your health care provider says that it is safe. °This information is not intended to replace advice given to you by your health care provider. Make sure you discuss any questions you have with your health care provider. °Document Released: 06/11/2010 Document Revised: 06/14/2017 Document Reviewed: 06/14/2017 °Elsevier Interactive Patient Education © 2019 [employer] ° °

## 2018-06-22 NOTE — Progress Notes (Signed)
Up to bathroom to void tol well  

## 2018-06-26 DIAGNOSIS — L851 Acquired keratosis [keratoderma] palmaris et plantaris: Secondary | ICD-10-CM | POA: Diagnosis not present

## 2018-06-26 DIAGNOSIS — B351 Tinea unguium: Secondary | ICD-10-CM | POA: Diagnosis not present

## 2018-06-26 DIAGNOSIS — I739 Peripheral vascular disease, unspecified: Secondary | ICD-10-CM | POA: Diagnosis not present

## 2018-06-28 DIAGNOSIS — Z6832 Body mass index (BMI) 32.0-32.9, adult: Secondary | ICD-10-CM | POA: Diagnosis not present

## 2018-06-28 DIAGNOSIS — I671 Cerebral aneurysm, nonruptured: Secondary | ICD-10-CM | POA: Diagnosis not present

## 2018-07-02 ENCOUNTER — Other Ambulatory Visit: Payer: Self-pay | Admitting: Cardiology

## 2018-07-02 MED ORDER — ATORVASTATIN CALCIUM 80 MG PO TABS: 80.0000 mg | ORAL_TABLET | Freq: Every day | ORAL | 3 refills | Status: AC

## 2018-07-02 NOTE — Telephone Encounter (Signed)
°*  STAT* If patient is at the pharmacy, call can be transferred to refill team.   1. Which medications need to be refilled? atorvastatin (LIPITOR) 80 MG tablet    2. Which pharmacy/location (including street and city if local pharmacy) is medication to be sent to? [facility] [place]   3. Do they need a 30 day or 90 day supply?    Will not get mail order supply until end of the month

## 2018-07-17 DIAGNOSIS — M8580 Other specified disorders of bone density and structure, unspecified site: Secondary | ICD-10-CM | POA: Diagnosis not present

## 2018-09-27 DIAGNOSIS — M48061 Spinal stenosis, lumbar region without neurogenic claudication: Secondary | ICD-10-CM | POA: Diagnosis not present

## 2018-09-27 DIAGNOSIS — M5416 Radiculopathy, lumbar region: Secondary | ICD-10-CM | POA: Diagnosis not present

## 2018-10-01 ENCOUNTER — Other Ambulatory Visit: Payer: Self-pay | Admitting: Cardiology

## 2018-10-01 MED ORDER — CARVEDILOL 6.25 MG PO TABS: 6.2500 mg | ORAL_TABLET | Freq: Two times a day (BID) | ORAL | 1 refills | Status: AC

## 2018-10-01 MED ORDER — ENALAPRIL MALEATE 5 MG PO TABS: 2.5000 mg | ORAL_TABLET | Freq: Two times a day (BID) | ORAL | 2 refills | Status: AC

## 2018-10-01 NOTE — Telephone Encounter (Signed)
Needing refills on   carvedilol (COREG) 6.25 MG tablet [[id number]]   enalapril (VASOTEC) 5 MG tablet [[id number]]   Sent to [facility]

## 2018-10-01 NOTE — Telephone Encounter (Signed)
Needing refills on   carvedilol (COREG) 6.25 MG tablet [[id number]]   enalapril (VASOTEC) 5 MG tablet [[id number]]   Sent to [facility]

## 2018-10-01 NOTE — Telephone Encounter (Signed)
Refills completed

## 2018-10-05 DIAGNOSIS — Z299 Encounter for prophylactic measures, unspecified: Secondary | ICD-10-CM | POA: Diagnosis not present

## 2018-10-05 DIAGNOSIS — Z6831 Body mass index (BMI) 31.0-31.9, adult: Secondary | ICD-10-CM | POA: Diagnosis not present

## 2018-10-05 DIAGNOSIS — J449 Chronic obstructive pulmonary disease, unspecified: Secondary | ICD-10-CM | POA: Diagnosis not present

## 2018-10-05 DIAGNOSIS — E1165 Type 2 diabetes mellitus with hyperglycemia: Secondary | ICD-10-CM | POA: Diagnosis not present

## 2018-10-05 DIAGNOSIS — K5190 Ulcerative colitis, unspecified, without complications: Secondary | ICD-10-CM | POA: Diagnosis not present

## 2018-10-05 DIAGNOSIS — M810 Age-related osteoporosis without current pathological fracture: Secondary | ICD-10-CM | POA: Diagnosis not present

## 2018-10-25 DIAGNOSIS — E1142 Type 2 diabetes mellitus with diabetic polyneuropathy: Secondary | ICD-10-CM | POA: Diagnosis not present

## 2018-10-25 DIAGNOSIS — J449 Chronic obstructive pulmonary disease, unspecified: Secondary | ICD-10-CM | POA: Diagnosis not present

## 2018-10-25 DIAGNOSIS — K5190 Ulcerative colitis, unspecified, without complications: Secondary | ICD-10-CM | POA: Diagnosis not present

## 2018-10-25 DIAGNOSIS — L02214 Cutaneous abscess of groin: Secondary | ICD-10-CM | POA: Diagnosis not present

## 2018-10-25 DIAGNOSIS — Z6832 Body mass index (BMI) 32.0-32.9, adult: Secondary | ICD-10-CM | POA: Diagnosis not present

## 2018-10-25 DIAGNOSIS — Z299 Encounter for prophylactic measures, unspecified: Secondary | ICD-10-CM | POA: Diagnosis not present

## 2018-10-25 DIAGNOSIS — I10 Essential (primary) hypertension: Secondary | ICD-10-CM | POA: Diagnosis not present

## 2018-10-29 DIAGNOSIS — M5416 Radiculopathy, lumbar region: Secondary | ICD-10-CM | POA: Diagnosis not present

## 2018-11-27 DIAGNOSIS — M48061 Spinal stenosis, lumbar region without neurogenic claudication: Secondary | ICD-10-CM | POA: Diagnosis not present

## 2018-11-27 DIAGNOSIS — M5416 Radiculopathy, lumbar region: Secondary | ICD-10-CM | POA: Diagnosis not present

## 2018-11-28 ENCOUNTER — Telehealth: Payer: Self-pay | Admitting: Cardiology

## 2018-11-28 NOTE — Telephone Encounter (Signed)
Virtual Visit Pre-Appointment Phone Call  "(Name), I am calling you today to discuss your upcoming appointment. We are currently trying to limit exposure to the virus that causes COVID-19 by seeing patients at home rather than in the office."  1. "What is the BEST phone number to call the day of the visit?" - include this in appointment notes  2. Do you have or have access to (through a family member/friend) a smartphone with video capability that we can use for your visit?" a. If yes - list this number in appt notes as cell (if different from BEST phone #) and list the appointment type as a VIDEO visit in appointment notes b. If no - list the appointment type as a PHONE visit in appointment notes  3. Confirm consent - "In the setting of the current Covid19 crisis, you are scheduled for a (phone or video) visit with your provider on (date) at (time).  Just as we do with many in-office visits, in order for you to participate in this visit, we must obtain consent.  If you'd like, I can send this to your mychart (if signed up) or email for you to review.  Otherwise, I can obtain your verbal consent now.  All virtual visits are billed to your insurance company just like a normal visit would be.  By agreeing to a virtual visit, we'd like you to understand that the technology does not allow for your provider to perform an examination, and thus may limit your provider's ability to fully assess your condition. If your provider identifies any concerns that need to be evaluated in person, we will make arrangements to do so.  Finally, though the technology is pretty good, we cannot assure that it will always work on either your or our end, and in the setting of a video visit, we may have to convert it to a phone-only visit.  In either situation, we cannot ensure that we have a secure connection.  Are you willing to proceed?" STAFF: Did the patient verbally acknowledge consent to telehealth visit? Document  YES/NO here: yes  4. Advise patient to be prepared - "Two hours prior to your appointment, go ahead and check your blood pressure, pulse, oxygen saturation, and your weight (if you have the equipment to check those) and write them all down. When your visit starts, your provider will ask you for this information. If you have an Apple Watch or Kardia device, please plan to have heart rate information ready on the day of your appointment. Please have a pen and paper handy nearby the day of the visit as well."  5. Give patient instructions for MyChart download to smartphone OR Doximity/Doxy.me as below if video visit (depending on what platform provider is using)  6. Inform patient they will receive a phone call 15 minutes prior to their appointment time (may be from unknown caller ID) so they should be prepared to answer    TELEPHONE CALL NOTE  Casey Avila has been deemed a candidate for a follow-up tele-health visit to limit community exposure during the Covid-19 pandemic. I spoke with the patient via phone to ensure availability of phone/video source, confirm preferred email & phone number, and discuss instructions and expectations.  I reminded Casey Avila to be prepared with any vital sign and/or heart rhythm information that could potentially be obtained via home monitoring, at the time of her visit. I reminded Casey Avila to expect a phone call prior to  her visit.  Casey Avila 11/28/2018 3:58 PM   INSTRUCTIONS FOR DOWNLOADING THE MYCHART APP TO SMARTPHONE  - The patient must first make sure to have activated MyChart and know their login information - If Apple, go to [employer] and type in MyChart in the search bar and download the app. If Android, ask patient to go to [employer] and type in [place] in the search bar and download the app. The app is free but as with any other app downloads, their phone may require them to verify saved payment information or  Apple/Android password.  - The patient will need to then log into the app with their MyChart username and password, and select [facility] as their healthcare provider to link the account. When it is time for your visit, go to the MyChart app, find appointments, and click Begin Video Visit. Be sure to Select Allow for your device to access the Microphone and Camera for your visit. You will then be connected, and your provider will be with you shortly.  **If they have any issues connecting, or need assistance please contact MyChart service desk [phone])**  **If using a computer, in order to ensure the best quality for their visit they will need to use either of the following Internet Browsers: [employer], or Google Chrome**  IF USING DOXIMITY or DOXY.ME - The patient will receive a link just prior to their visit by text.     FULL LENGTH CONSENT FOR TELE-HEALTH VISIT   I hereby voluntarily request, consent and authorize [facility] and its employed or contracted physicians, physician assistants, nurse practitioners or other licensed health care professionals (the Practitioner), to provide me with telemedicine health care services (the Services") as deemed necessary by the treating Practitioner. I acknowledge and consent to receive the Services by the Practitioner via telemedicine. I understand that the telemedicine visit will involve communicating with the Practitioner through live audiovisual communication technology and the disclosure of certain medical information by electronic transmission. I acknowledge that I have been given the opportunity to request an in-person assessment or other available alternative prior to the telemedicine visit and am voluntarily participating in the telemedicine visit.  I understand that I have the right to withhold or withdraw my consent to the use of telemedicine in the course of my care at any time, without affecting my right to future care  or treatment, and that the Practitioner or I may terminate the telemedicine visit at any time. I understand that I have the right to inspect all information obtained and/or recorded in the course of the telemedicine visit and may receive copies of available information for a reasonable fee.  I understand that some of the potential risks of receiving the Services via telemedicine include:   Delay or interruption in medical evaluation due to technological equipment failure or disruption;  Information transmitted may not be sufficient (e.g. poor resolution of images) to allow for appropriate medical decision making by the Practitioner; and/or   In rare instances, security protocols could fail, causing a breach of personal health information.  Furthermore, I acknowledge that it is my responsibility to provide information about my medical history, conditions and care that is complete and accurate to the best of my ability. I acknowledge that Practitioner's advice, recommendations, and/or decision may be based on factors not within their control, such as incomplete or inaccurate data provided by me or distortions of diagnostic images or specimens that may result from electronic transmissions. I  understand that the practice of medicine is not an exact science and that Practitioner makes no warranties or guarantees regarding treatment outcomes. I acknowledge that I will receive a copy of this consent concurrently upon execution via email to the email address I last provided but may also request a printed copy by calling the office of [facility].    I understand that my insurance will be billed for this visit.   I have read or had this consent read to me.  I understand the contents of this consent, which adequately explains the benefits and risks of the Services being provided via telemedicine.   I have been provided ample opportunity to ask questions regarding this consent and the Services and have had  my questions answered to my satisfaction.  I give my informed consent for the services to be provided through the use of telemedicine in my medical care  By participating in this telemedicine visit I agree to the above.

## 2018-12-05 ENCOUNTER — Telehealth (INDEPENDENT_AMBULATORY_CARE_PROVIDER_SITE_OTHER): Payer: Medicare Other | Admitting: Cardiology

## 2018-12-05 ENCOUNTER — Encounter: Payer: Self-pay | Admitting: Cardiology

## 2018-12-05 VITALS — BP 102/80 | HR 92 | Ht 64.0 in | Wt 195.0 lb

## 2018-12-05 DIAGNOSIS — I2510 Atherosclerotic heart disease of native coronary artery without angina pectoris: Secondary | ICD-10-CM | POA: Diagnosis not present

## 2018-12-05 DIAGNOSIS — E782 Mixed hyperlipidemia: Secondary | ICD-10-CM

## 2018-12-05 DIAGNOSIS — I10 Essential (primary) hypertension: Secondary | ICD-10-CM

## 2018-12-05 NOTE — Progress Notes (Signed)
Virtual Visit via Telephone Note   This visit type was conducted due to national recommendations for restrictions regarding the COVID-19 Pandemic (e.g. social distancing) in an effort to limit this patient's exposure and mitigate transmission in our community.  Due to her co-morbid illnesses, this patient is at least at moderate risk for complications without adequate follow up.  This format is felt to be most appropriate for this patient at this time.  The patient did not have access to video technology/had technical difficulties with video requiring transitioning to audio format only (telephone).  All issues noted in this document were discussed and addressed.  No physical exam could be performed with this format.  Please refer to the patient's chart for her  consent to telehealth for [facility].   Date:  12/05/2018   ID:  Casey Avila, DOB [birth year], MRN [id number]  Patient Location: Home Provider Location: Office  PCP:  Monico Blitz, MD  Cardiologist:  Carlyle Dolly, MD  Electrophysiologist:  None   Evaluation Performed:  Follow-Up Visit  Chief Complaint:  Follow up visit  History of Present Illness:    Casey Avila is a 73 y.o. female with seen today for follow up of the following medical problems.   1. CAD/ICM - admitted with anterior STEMI 05/02/14, s/p DES to LAD. LVEF 40% by echo at that time. Repeat echo 07/2014 shows LVEF 55-60%.   10/2016 nuclear stress: prior anterior/anteroseptal scar, mild peri-infarct ischemia. Mild apical ischemia. Low risk study   - 10/2017 cath: as reported below. Patent vessels and stent. LVEDP 22. - no exertional symptoms since last visit.    - No recent chest pain. Has had some neck pains down into arms, better with lidocaine patch.  - compliant with meds   2. HTN - she is compliant with meds  3. Hyperlipidemia - Jan 2020 TC 102 TG 97 HDL 49 LDL 34 - compliant with statin  4. Cerebral aneurysm - plans for IR  procedure end of month  5. Chronic back pain - followed neurosurgery    SH: staying with her son in [place], [place]. Working to move there full time.    The patient does not have symptoms concerning for COVID-19 infection (fever, chills, cough, or new shortness of breath).    Past Medical History:  Diagnosis Date  . Aneurysm ([place])    brain aneurysms x 2 x several yrs  . Coronary artery disease   . DDD (degenerative disc disease), cervical   . Diabetes mellitus without complication ([place])   . GERD (gastroesophageal reflux disease)   . HTN (hypertension)   . Hyperlipidemia   . Ischemic cardiomyopathy 05/02/2014  . Osteoarthritis   . Osteoporosis   . ST elevation (STEMI) myocardial infarction involving left anterior descending coronary artery ([place]) 05/02/2014  . Ulcerative colitis [facility])    Past Surgical History:  Procedure Laterality Date  . ABDOMINAL HYSTERECTOMY    . BLADDER SUSPENSION    . CARDIAC CATHETERIZATION    . IR ANGIO INTRA EXTRACRAN SEL INTERNAL CAROTID BILAT MOD SED  06/22/2018  . IR ANGIO VERTEBRAL SEL VERTEBRAL UNI R MOD SED  06/22/2018  . IR US GUIDE VASC ACCESS RIGHT  06/22/2018  . LEFT HEART CATH AND CORONARY ANGIOGRAPHY N/A 11/14/2017   Procedure: LEFT HEART CATH AND CORONARY ANGIOGRAPHY;  Surgeon: Belva Crome, MD;  Location: [place] CV LAB;  Service: Cardiovascular;  Laterality: N/A;  . LEFT HEART CATHETERIZATION WITH CORONARY ANGIOGRAM N/A 05/02/2014   Procedure: LEFT  HEART CATHETERIZATION WITH CORONARY ANGIOGRAM;  Surgeon: Jettie Booze, MD;  Location: [facility] CATH LAB;  Service: Cardiovascular;  Laterality: N/A;  . NECK SURGERY     x 2  . PTCA       No outpatient medications have been marked as taking for the 12/05/18 encounter (Appointment) with Arnoldo Lenis, MD.     Allergies:   Clindamycin/lincomycin, Bactrim [sulfamethoxazole-trimethoprim], and Morphine and related   Social History   Tobacco Use  . Smoking status: Former Smoker     Packs/day: 1.50    Years: 48.00    Pack years: 72.00    Types: Cigarettes    Start date: 05/09/1966    Quit date: 05/01/2014    Years since quitting: 4.6  . Smokeless tobacco: Never Used  Substance Use Topics  . Alcohol use: No    Alcohol/week: 0.0 standard drinks  . Drug use: No     Family Hx: The patient's family history includes Alcoholism in her father; Anuerysm in her mother and another family member; Colon cancer in her son.  ROS:   Please see the history of present illness.     All other systems reviewed and are negative.   Prior CV studies:   The following studies were reviewed today:  04/2014 Echo Study Conclusions  - Left ventricle: Can not rule out apical clot. Severe hypokinesis mid/apical inferior segments, mid/apical anterior segments, mid anteroseptal segment. The cavity size was normal. Wall thickness was increased in a pattern of mild LVH. The estimated ejection fraction was 40%. Doppler parameters are consistent with abnormal left ventricular relaxation (grade 1 diastolic dysfunction). - Right ventricle: The cavity size was normal. Systolic function was normal.  04/2014 Cath  HEMODYNAMICS: Aortic pressure was 132/67; LV pressure was 136/19; LVEDP 34. There was no gradient between the left ventricle and aorta.   ANGIOGRAPHIC DATA: The left main coronary artery is widely patent.  The left anterior descending artery is a large vessel. The vessel is occluded in the midportion. After intervention, it was noted that there was disease just past the occlusion. The remainder of the mid to distal LAD was widely patent. There were several small diagonals which were patent.  The left circumflex artery is a large vessel with mild irregularities proximally. There is a large first obtuse marginal which is widely patent. The second obtuse marginal is large and widely patent.  The right coronary artery is a large dominant vessel. In the mid  vessel, there is an eccentric 40% stenosis. The posterior descending artery is large and widely patent. The posterior lateral artery is large and widely patent.  LEFT VENTRICULOGRAM: Left ventricular angiogram was not done due to elevated LVEDP. LVEDP was 34 mmHg.  PCI NARRATIVE: A CLS 3.0 guiding catheter was used to engage the left main. A pro-water wire was placed across the area disease in the LAD. A fetch thrombectomy catheter was used to perform aspiration thrombectomy. There was significant thrombus removal. A second pass was performed with even more thrombus removal. TIMI-3 flow was restored after the first pass. There was a residual mid vessel lesion which was about 12 mm in length, up to 80%. Just past this, there was another segment of moderate disease. The more proximal area was ballooned with a 2.5 x 12 balloon. The entire area was stented with a 2.75 x 28 Promus drug-eluting stent, postdilated to greater than 3 mm in diameter. There was some sluggish flow in the very apical LAD. Multiple doses of IC adenosine  were given. TIMI-3 flow was present at the end of the procedure. The patient was pain-free.  IMPRESSIONS:  1. Normal left main coronary artery. 2. Occluded mid left anterior descending artery which was the culprit for today's presentation. This was successfully treated with a 2.75 x 28 Promus drug-eluting stent, postdilated to greater than 3 mm in diameter. 3. Mild disease in the left circumflex artery and its branches. 4. Mild to moderate disease in the right coronary artery. 5. Left ventricular systolic function was not assessed. LVEDP 34 mmHg.  RECOMMENDATION: We'll watch her in the ICU. Start low-dose beta blocker. Start high-dose statin. She needs aggressive secondary prevention. Of note, during the case, we checked about the safety of dual antiplatelet therapy with Dr. Ellene Route, as the patient has small brain aneurysms per her report. She was thought to be appropriate  for prolonged dual antiplatelet therapy. She will need dual antiplatelet therapy for at least a year. Check echocardiogram to evaluate LV function.    07/2014 Echo Study Conclusions  - Left ventricle: The cavity size was normal. Wall thickness was normal. Systolic function was normal. The estimated ejection fraction was in the range of 55% to 60%. There is mild hypokinesis of the mid-apicalanteroseptal myocardium. Doppler parameters are consistent with abnormal left ventricular relaxation (grade 1 diastolic dysfunction). Doppler parameters are consistent with elevated ventricular end-diastolic filling pressure. - Aortic valve: Mildly calcified annulus. Trileaflet. - Left atrium: The atrium was mildly dilated. - Right atrium: Central venous pressure (est): 3 mm Hg. - Atrial septum: There was increased thickness of the septum, consistent with lipomatous hypertrophy. - Pulmonary arteries: Systolic pressure could not be accurately estimated. - Pericardium, extracardiac: There was no pericardial effusion.  Impressions:  - Normal LV wall thickness with LVEF 55-60%, mild mid to apical anteroseptal hypokinesis, and grade 1 diastolic dysfunction. There has been improvement in LVEF compared to the prior study from December 2015. Mild left atrial enlargement. Unable to assess PASP.   11/2015 Nuclear Stress  There was no ST segment deviation noted during stress.  The study is normal. There are no perfusion defects consistent with prior infarction or current ischemia.  This is a low risk study.  The left ventricular ejection fraction is normal (55-65%).   10/2016 Nuclear stress  Blood pressure demonstrated a normal response to exercise.  There was no ST segment deviation noted during stress.  Findings consistent with prior anterior/anteroseptal myocardial infarction with mild peri-infarct ischemia. There is mild apical ischemia  This is a low risk  study. Overall small amount of myocardium currently at jeopardy.  The left ventricular ejection fraction is normal (55-65%).   11/14/17 cath  Normal left main  Luminal irregularities proximal LAD, widely patent stent within the mid LAD. No significant obstruction is noted in the LAD territory.  Luminal irregularity up to 20% in the proximal circumflex. 2 large obtuse marginal branches are widely patent.  Dominant right coronary with luminal irregularities in the mid vessel up to 30 to 40%.  Normal left ventricular systolic function with mildly elevated LVEDP of 22 mmHg consistent with diastolic heart failure. EF 45 to 50% with apical severe hypokinesis.  Post cath RECOMMENDATIONS:   Continue aggressive risk factor modification  Consider alternative explanations for the patient's exertional shoulder discomfort and dyspnea.  Labs/Other Tests and Data Reviewed:    EKG:  No ECG reviewed.  Recent Labs: 06/22/2018: BUN 12; Creatinine, Ser 0.80; Hemoglobin 12.7; Platelets 209; Potassium 3.8; Sodium 143   Recent Lipid Panel Lab Results  Component Value Date/Time   CHOL 130 05/05/2014 04:01 AM   TRIG 289 (H) 05/05/2014 04:01 AM   HDL 36 (L) 05/05/2014 04:01 AM   CHOLHDL 3.6 05/05/2014 04:01 AM   LDLCALC 36 05/05/2014 04:01 AM    Wt Readings from Last 3 Encounters:  06/22/18 180 lb (81.6 kg)  06/08/18 180 lb (81.6 kg)  12/12/17 200 lb (90.7 kg)     Objective:    Vital Signs:   Today's Vitals   12/05/18 1327  BP: 102/80  Pulse: 92  Weight: 195 lb (88.5 kg)  Height: 5\' 4"  (1.626 m)   Body mass index is 33.47 kg/m.;  Well nourished female sitting comfortable in no distress. Normal affect. Normal speech pattern and tone. No audible or visual signs of SOB or wheezing.   ASSESSMENT & PLAN:    1. CAD/ICMwith other forms of angina -no recent symptoms, continue current meds   2. HTN -at goal, continue current meds  3. Hyperlipidemia - she is at goal,  continue current meds     COVID-19 Education: The signs and symptoms of COVID-19 were discussed with the patient and how to seek care for testing (follow up with PCP or arrange E-visit).  The importance of social distancing was discussed today.  Time:   Today, I have spent 20 minutes with the patient with telehealth technology discussing the above problems.     Medication Adjustments/Labs and Tests Ordered: Current medicines are reviewed at length with the patient today.  Concerns regarding medicines are outlined above.   Tests Ordered: No orders of the defined types were placed in this encounter.   Medication Changes: No orders of the defined types were placed in this encounter.   Follow Up:  Virtual Visit in 4 month(s)  Signed, Carlyle Dolly, MD  12/05/2018 12:58 PM    [place]

## 2018-12-05 NOTE — Patient Instructions (Signed)
Your physician wants you to follow-up in: 4 MONTHS WITH DR BRANCH You will receive a reminder letter in the mail two months in advance. If you don't receive a letter, please call our office to schedule the follow-up appointment.  Your physician recommends that you continue on your current medications as directed. Please refer to the Current Medication list given to you today.  Thank you for choosing [facility]!!   

## 2018-12-10 ENCOUNTER — Other Ambulatory Visit: Payer: Self-pay | Admitting: Cardiology

## 2018-12-10 MED ORDER — SPIRONOLACTONE 25 MG PO TABS: 12.5000 mg | ORAL_TABLET | Freq: Every day | ORAL | 1 refills | Status: AC

## 2018-12-10 NOTE — Telephone Encounter (Signed)
° ° °  1. Which medications need to be refilled? (please list name of each medication and dose if known)  spironolactone (ALDACTONE) 25 MG tablet  2. Which pharmacy/location (including street and city if local pharmacy) is medication to be sent to? Requesting to be sent to [place]   3. Do they need a 30 day or 90 day supply?  [phone] -cell #

## 2018-12-10 NOTE — Telephone Encounter (Signed)
Medication sent to pharmacy  

## 2018-12-20 DIAGNOSIS — M5136 Other intervertebral disc degeneration, lumbar region: Secondary | ICD-10-CM | POA: Diagnosis not present

## 2018-12-20 DIAGNOSIS — E782 Mixed hyperlipidemia: Secondary | ICD-10-CM | POA: Diagnosis not present

## 2018-12-20 DIAGNOSIS — I671 Cerebral aneurysm, nonruptured: Secondary | ICD-10-CM | POA: Diagnosis not present

## 2018-12-20 DIAGNOSIS — R7302 Impaired glucose tolerance (oral): Secondary | ICD-10-CM | POA: Diagnosis not present

## 2018-12-20 DIAGNOSIS — J449 Chronic obstructive pulmonary disease, unspecified: Secondary | ICD-10-CM | POA: Diagnosis not present

## 2018-12-20 DIAGNOSIS — I10 Essential (primary) hypertension: Secondary | ICD-10-CM | POA: Diagnosis not present

## 2018-12-20 DIAGNOSIS — M5030 Other cervical disc degeneration, unspecified cervical region: Secondary | ICD-10-CM | POA: Diagnosis not present

## 2018-12-20 DIAGNOSIS — I2510 Atherosclerotic heart disease of native coronary artery without angina pectoris: Secondary | ICD-10-CM | POA: Diagnosis not present

## 2019-01-11 ENCOUNTER — Telehealth: Payer: Self-pay | Admitting: Cardiology

## 2019-01-11 NOTE — Telephone Encounter (Signed)
PATIENT calling about her BP being high   She increased her BP medication and wanted to see if it was ok to do this

## 2019-01-11 NOTE — Telephone Encounter (Signed)
Noticed blood pressure increasing for about one week. Report that she had a headache, so she checked her BP. BP was 160/95. Reports increasing enalapril 5 mg twice daily on yesterday. Today BP is 120-130/79. Headache has improved. Advised to continue checking BP and contact our office if her BP is too high or too low. Verbalized understanding of plan.

## 2019-01-14 DIAGNOSIS — E119 Type 2 diabetes mellitus without complications: Secondary | ICD-10-CM | POA: Diagnosis not present

## 2019-01-14 DIAGNOSIS — E1165 Type 2 diabetes mellitus with hyperglycemia: Secondary | ICD-10-CM | POA: Diagnosis not present

## 2019-01-14 DIAGNOSIS — E1142 Type 2 diabetes mellitus with diabetic polyneuropathy: Secondary | ICD-10-CM | POA: Diagnosis not present

## 2019-01-14 DIAGNOSIS — Z6835 Body mass index (BMI) 35.0-35.9, adult: Secondary | ICD-10-CM | POA: Diagnosis not present

## 2019-01-14 DIAGNOSIS — I10 Essential (primary) hypertension: Secondary | ICD-10-CM | POA: Diagnosis not present

## 2019-01-14 DIAGNOSIS — Z299 Encounter for prophylactic measures, unspecified: Secondary | ICD-10-CM | POA: Diagnosis not present

## 2019-01-24 DIAGNOSIS — E782 Mixed hyperlipidemia: Secondary | ICD-10-CM | POA: Diagnosis not present

## 2019-01-24 DIAGNOSIS — I255 Ischemic cardiomyopathy: Secondary | ICD-10-CM | POA: Diagnosis not present

## 2019-01-24 DIAGNOSIS — I10 Essential (primary) hypertension: Secondary | ICD-10-CM | POA: Diagnosis not present

## 2019-01-24 DIAGNOSIS — I671 Cerebral aneurysm, nonruptured: Secondary | ICD-10-CM | POA: Diagnosis not present

## 2019-01-24 DIAGNOSIS — I2510 Atherosclerotic heart disease of native coronary artery without angina pectoris: Secondary | ICD-10-CM | POA: Diagnosis not present

## 2019-01-29 DIAGNOSIS — M5414 Radiculopathy, thoracic region: Secondary | ICD-10-CM | POA: Diagnosis not present

## 2019-02-01 DIAGNOSIS — I10 Essential (primary) hypertension: Secondary | ICD-10-CM | POA: Diagnosis not present

## 2019-02-01 DIAGNOSIS — I2510 Atherosclerotic heart disease of native coronary artery without angina pectoris: Secondary | ICD-10-CM | POA: Diagnosis not present

## 2019-02-01 DIAGNOSIS — E782 Mixed hyperlipidemia: Secondary | ICD-10-CM | POA: Diagnosis not present

## 2019-02-01 DIAGNOSIS — I255 Ischemic cardiomyopathy: Secondary | ICD-10-CM | POA: Diagnosis not present

## 2019-02-04 DIAGNOSIS — M5116 Intervertebral disc disorders with radiculopathy, lumbar region: Secondary | ICD-10-CM | POA: Diagnosis not present

## 2019-02-04 DIAGNOSIS — M5126 Other intervertebral disc displacement, lumbar region: Secondary | ICD-10-CM | POA: Diagnosis not present

## 2019-02-04 DIAGNOSIS — M5412 Radiculopathy, cervical region: Secondary | ICD-10-CM | POA: Diagnosis not present

## 2019-02-11 DIAGNOSIS — H2513 Age-related nuclear cataract, bilateral: Secondary | ICD-10-CM | POA: Diagnosis not present

## 2019-02-11 DIAGNOSIS — H353132 Nonexudative age-related macular degeneration, bilateral, intermediate dry stage: Secondary | ICD-10-CM | POA: Diagnosis not present

## 2019-02-11 DIAGNOSIS — E119 Type 2 diabetes mellitus without complications: Secondary | ICD-10-CM | POA: Diagnosis not present

## 2019-02-18 DIAGNOSIS — E119 Type 2 diabetes mellitus without complications: Secondary | ICD-10-CM | POA: Diagnosis not present

## 2019-02-18 DIAGNOSIS — H2513 Age-related nuclear cataract, bilateral: Secondary | ICD-10-CM | POA: Diagnosis not present

## 2019-02-19 DIAGNOSIS — Z23 Encounter for immunization: Secondary | ICD-10-CM | POA: Diagnosis not present

## 2019-02-19 DIAGNOSIS — M5414 Radiculopathy, thoracic region: Secondary | ICD-10-CM | POA: Diagnosis not present

## 2019-02-19 DIAGNOSIS — M5134 Other intervertebral disc degeneration, thoracic region: Secondary | ICD-10-CM | POA: Diagnosis not present

## 2019-02-20 DIAGNOSIS — I10 Essential (primary) hypertension: Secondary | ICD-10-CM | POA: Diagnosis not present

## 2019-02-20 DIAGNOSIS — E782 Mixed hyperlipidemia: Secondary | ICD-10-CM | POA: Diagnosis not present

## 2019-02-20 DIAGNOSIS — I255 Ischemic cardiomyopathy: Secondary | ICD-10-CM | POA: Diagnosis not present

## 2019-02-20 DIAGNOSIS — I2510 Atherosclerotic heart disease of native coronary artery without angina pectoris: Secondary | ICD-10-CM | POA: Diagnosis not present

## 2019-03-04 DIAGNOSIS — M5412 Radiculopathy, cervical region: Secondary | ICD-10-CM | POA: Diagnosis not present

## 2019-03-05 DIAGNOSIS — H52222 Regular astigmatism, left eye: Secondary | ICD-10-CM | POA: Diagnosis not present

## 2019-03-05 DIAGNOSIS — H2512 Age-related nuclear cataract, left eye: Secondary | ICD-10-CM | POA: Diagnosis not present

## 2019-03-12 DIAGNOSIS — H2511 Age-related nuclear cataract, right eye: Secondary | ICD-10-CM | POA: Diagnosis not present

## 2019-03-14 DIAGNOSIS — M5030 Other cervical disc degeneration, unspecified cervical region: Secondary | ICD-10-CM | POA: Diagnosis not present

## 2019-03-14 DIAGNOSIS — I2510 Atherosclerotic heart disease of native coronary artery without angina pectoris: Secondary | ICD-10-CM | POA: Diagnosis not present

## 2019-03-14 DIAGNOSIS — I671 Cerebral aneurysm, nonruptured: Secondary | ICD-10-CM | POA: Diagnosis not present

## 2019-03-14 DIAGNOSIS — R7302 Impaired glucose tolerance (oral): Secondary | ICD-10-CM | POA: Diagnosis not present

## 2019-03-14 DIAGNOSIS — E1159 Type 2 diabetes mellitus with other circulatory complications: Secondary | ICD-10-CM | POA: Diagnosis not present

## 2019-03-22 DIAGNOSIS — D23122 Other benign neoplasm of skin of left lower eyelid, including canthus: Secondary | ICD-10-CM | POA: Diagnosis not present

## 2019-03-30 ENCOUNTER — Emergency Department (HOSPITAL_COMMUNITY)
Admission: EM | Admit: 2019-03-30 | Discharge: 2019-03-30 | Discharge status: Absent without leave | Payer: Medicare Other

## 2019-03-30 ENCOUNTER — Other Ambulatory Visit: Payer: Self-pay

## 2019-04-08 DIAGNOSIS — M5136 Other intervertebral disc degeneration, lumbar region: Secondary | ICD-10-CM | POA: Diagnosis not present

## 2019-04-08 DIAGNOSIS — E1159 Type 2 diabetes mellitus with other circulatory complications: Secondary | ICD-10-CM | POA: Diagnosis not present

## 2019-04-08 DIAGNOSIS — I671 Cerebral aneurysm, nonruptured: Secondary | ICD-10-CM | POA: Diagnosis not present

## 2019-04-08 DIAGNOSIS — N764 Abscess of vulva: Secondary | ICD-10-CM | POA: Diagnosis not present

## 2019-04-08 DIAGNOSIS — I255 Ischemic cardiomyopathy: Secondary | ICD-10-CM | POA: Diagnosis not present

## 2019-04-08 DIAGNOSIS — Z1231 Encounter for screening mammogram for malignant neoplasm of breast: Secondary | ICD-10-CM | POA: Diagnosis not present

## 2019-04-10 DIAGNOSIS — I671 Cerebral aneurysm, nonruptured: Secondary | ICD-10-CM | POA: Diagnosis not present

## 2019-04-12 ENCOUNTER — Ambulatory Visit: Payer: Medicare Other | Admitting: Cardiology

## 2019-04-24 DIAGNOSIS — Z1231 Encounter for screening mammogram for malignant neoplasm of breast: Secondary | ICD-10-CM | POA: Diagnosis not present

## 2019-04-25 DIAGNOSIS — N952 Postmenopausal atrophic vaginitis: Secondary | ICD-10-CM | POA: Diagnosis not present

## 2019-04-25 DIAGNOSIS — N764 Abscess of vulva: Secondary | ICD-10-CM | POA: Diagnosis not present

## 2019-06-06 DIAGNOSIS — G8929 Other chronic pain: Secondary | ICD-10-CM | POA: Diagnosis not present

## 2019-06-06 DIAGNOSIS — M542 Cervicalgia: Secondary | ICD-10-CM | POA: Diagnosis not present

## 2019-06-28 DIAGNOSIS — M47812 Spondylosis without myelopathy or radiculopathy, cervical region: Secondary | ICD-10-CM | POA: Diagnosis not present

## 2019-06-28 DIAGNOSIS — G8929 Other chronic pain: Secondary | ICD-10-CM | POA: Diagnosis not present

## 2019-06-28 DIAGNOSIS — M542 Cervicalgia: Secondary | ICD-10-CM | POA: Diagnosis not present

## 2019-07-02 DIAGNOSIS — Z981 Arthrodesis status: Secondary | ICD-10-CM | POA: Diagnosis not present

## 2019-07-02 DIAGNOSIS — M47812 Spondylosis without myelopathy or radiculopathy, cervical region: Secondary | ICD-10-CM | POA: Diagnosis not present

## 2019-07-09 DIAGNOSIS — I255 Ischemic cardiomyopathy: Secondary | ICD-10-CM | POA: Diagnosis not present

## 2019-07-09 DIAGNOSIS — E1159 Type 2 diabetes mellitus with other circulatory complications: Secondary | ICD-10-CM | POA: Diagnosis not present

## 2019-07-09 DIAGNOSIS — I2510 Atherosclerotic heart disease of native coronary artery without angina pectoris: Secondary | ICD-10-CM | POA: Diagnosis not present

## 2019-07-09 DIAGNOSIS — M5136 Other intervertebral disc degeneration, lumbar region: Secondary | ICD-10-CM | POA: Diagnosis not present

## 2019-07-14 DIAGNOSIS — Z87891 Personal history of nicotine dependence: Secondary | ICD-10-CM | POA: Diagnosis not present

## 2019-07-14 DIAGNOSIS — Z79899 Other long term (current) drug therapy: Secondary | ICD-10-CM | POA: Diagnosis not present

## 2019-07-14 DIAGNOSIS — I2510 Atherosclerotic heart disease of native coronary artery without angina pectoris: Secondary | ICD-10-CM | POA: Diagnosis not present

## 2019-07-14 DIAGNOSIS — I252 Old myocardial infarction: Secondary | ICD-10-CM | POA: Diagnosis not present

## 2019-07-14 DIAGNOSIS — I10 Essential (primary) hypertension: Secondary | ICD-10-CM | POA: Diagnosis not present

## 2019-07-14 DIAGNOSIS — Z7982 Long term (current) use of aspirin: Secondary | ICD-10-CM | POA: Diagnosis not present

## 2019-07-14 DIAGNOSIS — I491 Atrial premature depolarization: Secondary | ICD-10-CM | POA: Diagnosis not present

## 2019-07-14 DIAGNOSIS — R0789 Other chest pain: Secondary | ICD-10-CM | POA: Diagnosis not present

## 2019-07-14 DIAGNOSIS — R079 Chest pain, unspecified: Secondary | ICD-10-CM | POA: Diagnosis not present

## 2019-07-14 DIAGNOSIS — R000 Tachycardia, unspecified: Secondary | ICD-10-CM | POA: Diagnosis not present

## 2019-07-15 DIAGNOSIS — M5126 Other intervertebral disc displacement, lumbar region: Secondary | ICD-10-CM | POA: Diagnosis not present

## 2019-07-16 DIAGNOSIS — I2510 Atherosclerotic heart disease of native coronary artery without angina pectoris: Secondary | ICD-10-CM | POA: Diagnosis not present

## 2019-07-16 DIAGNOSIS — R079 Chest pain, unspecified: Secondary | ICD-10-CM | POA: Diagnosis not present

## 2019-07-16 DIAGNOSIS — I255 Ischemic cardiomyopathy: Secondary | ICD-10-CM | POA: Diagnosis not present

## 2019-07-16 DIAGNOSIS — R001 Bradycardia, unspecified: Secondary | ICD-10-CM | POA: Diagnosis not present

## 2019-07-23 DIAGNOSIS — Z6834 Body mass index (BMI) 34.0-34.9, adult: Secondary | ICD-10-CM | POA: Diagnosis not present

## 2019-07-23 DIAGNOSIS — R001 Bradycardia, unspecified: Secondary | ICD-10-CM | POA: Diagnosis not present

## 2019-07-23 DIAGNOSIS — I255 Ischemic cardiomyopathy: Secondary | ICD-10-CM | POA: Diagnosis not present

## 2019-07-23 DIAGNOSIS — Z7982 Long term (current) use of aspirin: Secondary | ICD-10-CM | POA: Diagnosis not present

## 2019-07-23 DIAGNOSIS — R079 Chest pain, unspecified: Secondary | ICD-10-CM | POA: Diagnosis not present

## 2019-07-23 DIAGNOSIS — I2510 Atherosclerotic heart disease of native coronary artery without angina pectoris: Secondary | ICD-10-CM | POA: Diagnosis not present

## 2019-07-23 DIAGNOSIS — R7302 Impaired glucose tolerance (oral): Secondary | ICD-10-CM | POA: Diagnosis not present

## 2019-07-23 DIAGNOSIS — Z9889 Other specified postprocedural states: Secondary | ICD-10-CM | POA: Diagnosis not present

## 2019-07-23 DIAGNOSIS — Z79899 Other long term (current) drug therapy: Secondary | ICD-10-CM | POA: Diagnosis not present

## 2019-07-25 DIAGNOSIS — I2510 Atherosclerotic heart disease of native coronary artery without angina pectoris: Secondary | ICD-10-CM | POA: Diagnosis not present

## 2019-07-25 DIAGNOSIS — R001 Bradycardia, unspecified: Secondary | ICD-10-CM | POA: Diagnosis not present

## 2019-07-25 DIAGNOSIS — R079 Chest pain, unspecified: Secondary | ICD-10-CM | POA: Diagnosis not present

## 2019-08-15 DIAGNOSIS — J449 Chronic obstructive pulmonary disease, unspecified: Secondary | ICD-10-CM | POA: Diagnosis not present

## 2019-08-15 DIAGNOSIS — I671 Cerebral aneurysm, nonruptured: Secondary | ICD-10-CM | POA: Diagnosis not present

## 2019-08-15 DIAGNOSIS — I2510 Atherosclerotic heart disease of native coronary artery without angina pectoris: Secondary | ICD-10-CM | POA: Diagnosis not present

## 2019-08-15 DIAGNOSIS — I10 Essential (primary) hypertension: Secondary | ICD-10-CM | POA: Diagnosis not present

## 2019-08-15 DIAGNOSIS — E785 Hyperlipidemia, unspecified: Secondary | ICD-10-CM | POA: Diagnosis not present

## 2019-08-15 DIAGNOSIS — I255 Ischemic cardiomyopathy: Secondary | ICD-10-CM | POA: Diagnosis not present

## 2019-08-15 DIAGNOSIS — R001 Bradycardia, unspecified: Secondary | ICD-10-CM | POA: Diagnosis not present

## 2019-08-21 DIAGNOSIS — M5126 Other intervertebral disc displacement, lumbar region: Secondary | ICD-10-CM | POA: Diagnosis not present
